# Patient Record
Sex: Female | Born: 1975
Health system: Southern US, Community
[De-identification: ages and names within clinical notes are randomized; demographics above are authoritative.]

## PROBLEM LIST (undated history)

## (undated) DIAGNOSIS — J45909 Unspecified asthma, uncomplicated: Secondary | ICD-10-CM

## (undated) DIAGNOSIS — F319 Bipolar disorder, unspecified: Secondary | ICD-10-CM

## (undated) DIAGNOSIS — F1990 Other psychoactive substance use, unspecified, uncomplicated: Secondary | ICD-10-CM

## (undated) DIAGNOSIS — F419 Anxiety disorder, unspecified: Secondary | ICD-10-CM

## (undated) DIAGNOSIS — F32A Depression, unspecified: Secondary | ICD-10-CM

## (undated) DIAGNOSIS — J449 Chronic obstructive pulmonary disease, unspecified: Secondary | ICD-10-CM

## (undated) DIAGNOSIS — R0602 Shortness of breath: Secondary | ICD-10-CM

## (undated) DIAGNOSIS — L0291 Cutaneous abscess, unspecified: Secondary | ICD-10-CM

## (undated) DIAGNOSIS — F1120 Opioid dependence, uncomplicated: Secondary | ICD-10-CM

## (undated) DIAGNOSIS — J189 Pneumonia, unspecified organism: Secondary | ICD-10-CM

## (undated) DIAGNOSIS — F329 Major depressive disorder, single episode, unspecified: Secondary | ICD-10-CM

## (undated) DIAGNOSIS — F17200 Nicotine dependence, unspecified, uncomplicated: Secondary | ICD-10-CM

## (undated) DIAGNOSIS — A4902 Methicillin resistant Staphylococcus aureus infection, unspecified site: Secondary | ICD-10-CM

## (undated) HISTORY — PX: LAPAROSCOPIC CHOLECYSTECTOMY: SUR755

## (undated) HISTORY — PX: TUBAL LIGATION: SHX77

---

## 2010-02-09 ENCOUNTER — Emergency Department (HOSPITAL_COMMUNITY): Admission: EM | Admit: 2010-02-09 | Discharge: 2010-02-09 | Payer: Self-pay | Admitting: Emergency Medicine

## 2010-03-22 ENCOUNTER — Emergency Department (HOSPITAL_COMMUNITY): Admission: EM | Admit: 2010-03-22 | Discharge: 2010-03-22 | Payer: Self-pay | Admitting: Emergency Medicine

## 2010-05-30 ENCOUNTER — Ambulatory Visit: Payer: Self-pay | Admitting: Gastroenterology

## 2010-09-05 ENCOUNTER — Emergency Department (HOSPITAL_COMMUNITY)
Admission: EM | Admit: 2010-09-05 | Discharge: 2010-09-05 | Payer: Self-pay | Source: Home / Self Care | Admitting: Emergency Medicine

## 2010-12-01 ENCOUNTER — Emergency Department (HOSPITAL_COMMUNITY)
Admission: EM | Admit: 2010-12-01 | Discharge: 2010-12-01 | Payer: Self-pay | Source: Home / Self Care | Admitting: Emergency Medicine

## 2010-12-01 LAB — RAPID STREP SCREEN (MED CTR MEBANE ONLY): Streptococcus, Group A Screen (Direct): POSITIVE — AB

## 2011-01-20 LAB — URINALYSIS, ROUTINE W REFLEX MICROSCOPIC
Bilirubin Urine: NEGATIVE
Hgb urine dipstick: NEGATIVE
Ketones, ur: NEGATIVE mg/dL
Nitrite: NEGATIVE
pH: 7 (ref 5.0–8.0)

## 2012-06-06 ENCOUNTER — Encounter (HOSPITAL_COMMUNITY): Payer: Self-pay | Admitting: *Deleted

## 2012-06-06 ENCOUNTER — Emergency Department (HOSPITAL_COMMUNITY): Payer: Self-pay

## 2012-06-06 ENCOUNTER — Emergency Department (HOSPITAL_COMMUNITY)
Admission: EM | Admit: 2012-06-06 | Discharge: 2012-06-06 | Disposition: A | Discharge status: Home / Self Care | Payer: Self-pay | Attending: Emergency Medicine | Admitting: Emergency Medicine

## 2012-06-06 DIAGNOSIS — R0602 Shortness of breath: Secondary | ICD-10-CM | POA: Insufficient documentation

## 2012-06-06 DIAGNOSIS — R062 Wheezing: Secondary | ICD-10-CM | POA: Insufficient documentation

## 2012-06-06 DIAGNOSIS — J40 Bronchitis, not specified as acute or chronic: Secondary | ICD-10-CM

## 2012-06-06 DIAGNOSIS — F329 Major depressive disorder, single episode, unspecified: Secondary | ICD-10-CM | POA: Insufficient documentation

## 2012-06-06 DIAGNOSIS — F3289 Other specified depressive episodes: Secondary | ICD-10-CM | POA: Insufficient documentation

## 2012-06-06 DIAGNOSIS — F17200 Nicotine dependence, unspecified, uncomplicated: Secondary | ICD-10-CM | POA: Insufficient documentation

## 2012-06-06 HISTORY — DX: Major depressive disorder, single episode, unspecified: F32.9

## 2012-06-06 HISTORY — DX: Depression, unspecified: F32.A

## 2012-06-06 MED ORDER — IPRATROPIUM BROMIDE 0.02 % IN SOLN
0.5000 mg | Freq: Once | RESPIRATORY_TRACT | Status: AC
  Administered 2012-06-06: 0.5 mg via RESPIRATORY_TRACT
  Filled 2012-06-06: qty 2.5

## 2012-06-06 MED ORDER — ALBUTEROL SULFATE (5 MG/ML) 0.5% IN NEBU
5.0000 mg | INHALATION_SOLUTION | Freq: Once | RESPIRATORY_TRACT | Status: AC
  Administered 2012-06-06: 5 mg via RESPIRATORY_TRACT
  Filled 2012-06-06: qty 1

## 2012-06-06 MED ORDER — PREDNISONE 10 MG PO TABS: 20.0000 mg | ORAL_TABLET | Freq: Every day | ORAL | Status: DC

## 2012-06-06 MED ORDER — ALBUTEROL SULFATE HFA 108 (90 BASE) MCG/ACT IN AERS
2.0000 | INHALATION_SPRAY | Freq: Once | RESPIRATORY_TRACT | Status: AC
  Administered 2012-06-06: 2 via RESPIRATORY_TRACT
  Filled 2012-06-06: qty 6.7

## 2012-06-06 MED ORDER — BENZONATATE 100 MG PO CAPS: 100.0000 mg | ORAL_CAPSULE | Freq: Three times a day (TID) | ORAL | Status: AC

## 2012-06-06 MED ORDER — BENZONATATE 100 MG PO CAPS
200.0000 mg | ORAL_CAPSULE | Freq: Once | ORAL | Status: AC
  Administered 2012-06-06: 200 mg via ORAL
  Filled 2012-06-06: qty 2

## 2012-06-06 MED ORDER — PREDNISONE 20 MG PO TABS
60.0000 mg | ORAL_TABLET | Freq: Once | ORAL | Status: AC
  Administered 2012-06-06: 60 mg via ORAL
  Filled 2012-06-06: qty 3

## 2012-06-06 NOTE — ED Notes (Signed)
Reports mild sob that started yesterday, occurs with mild exertion. No resp distress noted at triage, speaking in full sentences. Reports having a "knot" to upper thigh area.

## 2012-06-06 NOTE — ED Notes (Signed)
The pt has had difficulty breathing all day.  She does not have asthma but she is a smoker and she currently has  Audible wheezes.  Coughing non-productive

## 2012-06-06 NOTE — ED Notes (Signed)
The pt has been given prednisone

## 2012-06-06 NOTE — ED Notes (Signed)
The hhn appears to have helped her breathing.  It is almost complete

## 2012-06-06 NOTE — ED Provider Notes (Signed)
History     CSN: [id number]  Arrival date & time 06/06/12  1756   First MD Initiated Contact with Wendy Douglas 06/06/12 2005      Chief Complaint  Wendy Douglas presents with  . Shortness of Breath    (Consider location/radiation/quality/duration/timing/severity/associated sxs/prior treatment) Wendy Douglas is a 36 y.o. female presenting with shortness of breath. The history is provided by the Wendy Douglas.  Shortness of Breath  Associated symptoms include shortness of breath.   Wendy Douglas here with shortness of breath x1 day. She describes wheezing and is worse with exertion. Denies any productive cough or fever. No medications used for this prior to arrival. Denies any anginal type chest pain. Wendy Douglas also notes a knot at her left upper mid thigh which is half or 2 months has been coming and going. Denies any leg pain or swelling with this.  Past Medical History  Diagnosis Date  . Depression     History reviewed. No pertinent past surgical history.  History reviewed. No pertinent family history.  History  Substance Use Topics  . Smoking status: Current Everyday Smoker    Types: Cigarettes  . Smokeless tobacco: Not on file  . Alcohol Use: No    OB History    Grav Para Term Preterm Abortions TAB SAB Ect Mult Living                  Review of Systems  Respiratory: Positive for shortness of breath.   All other systems reviewed and are negative.    Allergies  Bee venom and Sulfa antibiotics  Home Medications  No current outpatient prescriptions on file.  BP 155/93  Pulse 69  Temp 98.5 F (36.9 C) (Oral)  Resp 18  SpO2 97%  LMP 05/23/2012  Physical Exam  Nursing note and vitals reviewed. Constitutional: She is oriented to person, place, and time. She appears well-developed and well-nourished.  Non-toxic appearance. No distress.  HENT:  Head: Normocephalic and atraumatic.  Eyes: Conjunctivae, EOM and lids are normal. Pupils are equal, round, and reactive to light.  Neck: Normal  range of motion. Neck supple. No tracheal deviation present. No mass present.  Cardiovascular: Normal rate, regular rhythm and normal heart sounds.  Exam reveals no gallop.   No murmur heard. Pulmonary/Chest: Effort normal. No stridor. No respiratory distress. She has decreased breath sounds. She has wheezes. She has no rhonchi. She has no rales.  Abdominal: Soft. Normal appearance and bowel sounds are normal. She exhibits no distension. There is no tenderness. There is no rebound and no CVA tenderness.  Musculoskeletal: Normal range of motion. She exhibits no edema and no tenderness.       Small golf ball size round mass at left upper midline without erythema.  Neurological: She is alert and oriented to person, place, and time. She has normal strength. No cranial nerve deficit or sensory deficit. GCS eye subscore is 4. GCS verbal subscore is 5. GCS motor subscore is 6.  Skin: Skin is warm and dry. No abrasion and no rash noted.  Psychiatric: She has a normal mood and affect. Her speech is normal and behavior is normal.    ED Course  Procedures (including critical care time)  Labs Reviewed - No data to display Dg Chest 2 View  06/06/2012  *RADIOLOGY REPORT*  Clinical Data: Shortness of breath, cough, congestion  CHEST - 2 VIEW  Comparison: 09/05/2010  Findings: Hyperinflation noted with diffuse increased interstitial markings, compatible with asthma versus chronic bronchitis.  No focal pneumonia, collapse, consolidation,  definite edema, effusion or pneumothorax.  Trachea midline.  Normal heart size and vascularity.  Previous cholecystectomy evident.  IMPRESSION: Hyperinflation with chronic interstitial changes as described.  Original Report Authenticated By: Judie Petit. Ruel Favors, M.D.     No diagnosis found.    MDM   Date: 06/06/2012  Rate: 76  Rhythm: normal sinus rhythm  QRS Axis: normal  Intervals: normal  ST/T Wave abnormalities: normal  Conduction Disutrbances:none  Narrative  Interpretation:   Old EKG Reviewed: none available   9:22 PM Wendy Douglas given a Bier all treatment and prednisone here. Lung exam repeated and is improved. Chest x-ray without infiltrates. She will be treated for her bronchitis and she was told to stop smoking       Toy Baker, MD 06/06/12 2124

## 2012-11-15 ENCOUNTER — Emergency Department (HOSPITAL_COMMUNITY): Payer: Self-pay

## 2012-11-15 ENCOUNTER — Other Ambulatory Visit: Payer: Self-pay

## 2012-11-15 ENCOUNTER — Encounter (HOSPITAL_COMMUNITY): Payer: Self-pay | Admitting: Radiology

## 2012-11-15 ENCOUNTER — Inpatient Hospital Stay (HOSPITAL_COMMUNITY)
Admission: EM | Admit: 2012-11-15 | Discharge: 2012-11-22 | DRG: 871 | Disposition: A | Discharge status: Home / Self Care | Payer: Self-pay | Attending: Internal Medicine | Admitting: Internal Medicine

## 2012-11-15 DIAGNOSIS — F329 Major depressive disorder, single episode, unspecified: Secondary | ICD-10-CM | POA: Diagnosis present

## 2012-11-15 DIAGNOSIS — D72829 Elevated white blood cell count, unspecified: Secondary | ICD-10-CM | POA: Diagnosis present

## 2012-11-15 DIAGNOSIS — F1920 Other psychoactive substance dependence, uncomplicated: Secondary | ICD-10-CM

## 2012-11-15 DIAGNOSIS — IMO0002 Reserved for concepts with insufficient information to code with codable children: Secondary | ICD-10-CM

## 2012-11-15 DIAGNOSIS — F1120 Opioid dependence, uncomplicated: Secondary | ICD-10-CM | POA: Diagnosis present

## 2012-11-15 DIAGNOSIS — J101 Influenza due to other identified influenza virus with other respiratory manifestations: Secondary | ICD-10-CM | POA: Diagnosis present

## 2012-11-15 DIAGNOSIS — I498 Other specified cardiac arrhythmias: Secondary | ICD-10-CM | POA: Diagnosis present

## 2012-11-15 DIAGNOSIS — E860 Dehydration: Secondary | ICD-10-CM | POA: Diagnosis present

## 2012-11-15 DIAGNOSIS — J9601 Acute respiratory failure with hypoxia: Secondary | ICD-10-CM | POA: Diagnosis present

## 2012-11-15 DIAGNOSIS — Z79899 Other long term (current) drug therapy: Secondary | ICD-10-CM

## 2012-11-15 DIAGNOSIS — F32A Depression, unspecified: Secondary | ICD-10-CM | POA: Diagnosis present

## 2012-11-15 DIAGNOSIS — R6510 Systemic inflammatory response syndrome (SIRS) of non-infectious origin without acute organ dysfunction: Secondary | ICD-10-CM | POA: Diagnosis present

## 2012-11-15 DIAGNOSIS — E871 Hypo-osmolality and hyponatremia: Secondary | ICD-10-CM | POA: Diagnosis present

## 2012-11-15 DIAGNOSIS — F3289 Other specified depressive episodes: Secondary | ICD-10-CM

## 2012-11-15 DIAGNOSIS — F17200 Nicotine dependence, unspecified, uncomplicated: Secondary | ICD-10-CM | POA: Diagnosis present

## 2012-11-15 DIAGNOSIS — J9600 Acute respiratory failure, unspecified whether with hypoxia or hypercapnia: Secondary | ICD-10-CM

## 2012-11-15 DIAGNOSIS — J1000 Influenza due to other identified influenza virus with unspecified type of pneumonia: Secondary | ICD-10-CM | POA: Diagnosis present

## 2012-11-15 DIAGNOSIS — J189 Pneumonia, unspecified organism: Secondary | ICD-10-CM | POA: Diagnosis present

## 2012-11-15 DIAGNOSIS — J441 Chronic obstructive pulmonary disease with (acute) exacerbation: Secondary | ICD-10-CM | POA: Diagnosis present

## 2012-11-15 DIAGNOSIS — A419 Sepsis, unspecified organism: Principal | ICD-10-CM | POA: Diagnosis present

## 2012-11-15 DIAGNOSIS — J449 Chronic obstructive pulmonary disease, unspecified: Secondary | ICD-10-CM | POA: Diagnosis present

## 2012-11-15 HISTORY — DX: Opioid dependence, uncomplicated: F11.20

## 2012-11-15 HISTORY — DX: Nicotine dependence, unspecified, uncomplicated: F17.200

## 2012-11-15 HISTORY — DX: Chronic obstructive pulmonary disease, unspecified: J44.9

## 2012-11-15 LAB — CBC
Hemoglobin: 12.5 g/dL (ref 12.0–15.0)
MCH: 31.8 pg (ref 26.0–34.0)
MCHC: 33.7 g/dL (ref 30.0–36.0)
MCV: 94.4 fL (ref 78.0–100.0)
RBC: 3.93 MIL/uL (ref 3.87–5.11)

## 2012-11-15 LAB — HIV ANTIBODY (ROUTINE TESTING W REFLEX): HIV: NONREACTIVE

## 2012-11-15 LAB — CBC WITH DIFFERENTIAL/PLATELET
Eosinophils Absolute: 0 10*3/uL (ref 0.0–0.7)
Eosinophils Relative: 0 % (ref 0–5)
HCT: 41.6 % (ref 36.0–46.0)
Hemoglobin: 14.3 g/dL (ref 12.0–15.0)
Lymphocytes Relative: 6 % — ABNORMAL LOW (ref 12–46)
Lymphs Abs: 0.8 10*3/uL (ref 0.7–4.0)
MCH: 32.2 pg (ref 26.0–34.0)
MCV: 93.7 fL (ref 78.0–100.0)
Monocytes Absolute: 1.3 10*3/uL — ABNORMAL HIGH (ref 0.1–1.0)
Monocytes Relative: 10 % (ref 3–12)
Platelets: 140 10*3/uL — ABNORMAL LOW (ref 150–400)
RBC: 4.44 MIL/uL (ref 3.87–5.11)
WBC: 13.1 10*3/uL — ABNORMAL HIGH (ref 4.0–10.5)

## 2012-11-15 LAB — POCT I-STAT 3, ART BLOOD GAS (G3+)
Acid-base deficit: 4 mmol/L — ABNORMAL HIGH (ref 0.0–2.0)
Bicarbonate: 21.4 mEq/L (ref 20.0–24.0)
pH, Arterial: 7.35 (ref 7.350–7.450)
pO2, Arterial: 141 mmHg — ABNORMAL HIGH (ref 80.0–100.0)

## 2012-11-15 LAB — BASIC METABOLIC PANEL
BUN: 7 mg/dL (ref 6–23)
CO2: 22 mEq/L (ref 19–32)
Calcium: 9.3 mg/dL (ref 8.4–10.5)
GFR calc non Af Amer: >90 mL/min (ref >90)
Glucose, Bld: 118 mg/dL — ABNORMAL HIGH (ref 70–99)

## 2012-11-15 LAB — CREATININE, SERUM: Creatinine, Ser: 0.58 mg/dL (ref 0.50–1.10)

## 2012-11-15 LAB — INFLUENZA PANEL BY PCR (TYPE A & B)
H1N1 flu by pcr: DETECTED — AB
Influenza B By PCR: NEGATIVE

## 2012-11-15 LAB — TROPONIN I
Troponin I: <0.3 ng/mL (ref <0.30)
Troponin I: <0.3 ng/mL (ref <0.30)

## 2012-11-15 MED ORDER — IPRATROPIUM BROMIDE 0.02 % IN SOLN
0.5000 mg | RESPIRATORY_TRACT | Status: DC
  Administered 2012-11-15: 0.5 mg via RESPIRATORY_TRACT
  Filled 2012-11-15 (×3): qty 2.5

## 2012-11-15 MED ORDER — LACTATED RINGERS IV BOLUS (SEPSIS): 1000.0000 mL | Freq: Once | INTRAVENOUS | Status: DC

## 2012-11-15 MED ORDER — ALBUTEROL SULFATE (5 MG/ML) 0.5% IN NEBU
5.0000 mg | INHALATION_SOLUTION | Freq: Once | RESPIRATORY_TRACT | Status: AC
  Administered 2012-11-15: 5 mg via RESPIRATORY_TRACT

## 2012-11-15 MED ORDER — ALBUTEROL SULFATE (5 MG/ML) 0.5% IN NEBU
2.5000 mg | INHALATION_SOLUTION | RESPIRATORY_TRACT | Status: DC | PRN
  Administered 2012-11-16: 2.5 mg via RESPIRATORY_TRACT
  Filled 2012-11-15: qty 0.5

## 2012-11-15 MED ORDER — CITALOPRAM HYDROBROMIDE 40 MG PO TABS
40.0000 mg | ORAL_TABLET | Freq: Every day | ORAL | Status: DC
  Administered 2012-11-15 – 2012-11-22 (×8): 40 mg via ORAL
  Filled 2012-11-15 (×8): qty 1

## 2012-11-15 MED ORDER — ZOLPIDEM TARTRATE 5 MG PO TABS
5.0000 mg | ORAL_TABLET | Freq: Once | ORAL | Status: AC
  Administered 2012-11-15: 5 mg via ORAL
  Filled 2012-11-15: qty 1

## 2012-11-15 MED ORDER — ACETAMINOPHEN 325 MG PO TABS
650.0000 mg | ORAL_TABLET | Freq: Four times a day (QID) | ORAL | Status: DC | PRN
  Administered 2012-11-16 – 2012-11-17 (×2): 650 mg via ORAL
  Filled 2012-11-15 (×2): qty 2

## 2012-11-15 MED ORDER — ONDANSETRON HCL 4 MG/2ML IJ SOLN: 4.0000 mg | Freq: Four times a day (QID) | INTRAMUSCULAR | Status: DC | PRN

## 2012-11-15 MED ORDER — METHADONE HCL 10 MG/ML PO CONC: 110.0000 mg | Freq: Every day | ORAL | Status: DC

## 2012-11-15 MED ORDER — SODIUM CHLORIDE 0.9 % IV SOLN
1000.0000 mL | INTRAVENOUS | Status: DC
  Administered 2012-11-15: 1000 mL via INTRAVENOUS

## 2012-11-15 MED ORDER — METHACHOLINE CHLORIDE 100 MG IN SOLR: 118.0000 mg | Freq: Every day | RESPIRATORY_TRACT | Status: DC

## 2012-11-15 MED ORDER — METHADONE HCL 10 MG PO TABS
110.0000 mg | ORAL_TABLET | Freq: Every day | ORAL | Status: DC
  Administered 2012-11-15 – 2012-11-22 (×8): 110 mg via ORAL
  Filled 2012-11-15 (×8): qty 11

## 2012-11-15 MED ORDER — IPRATROPIUM BROMIDE 0.02 % IN SOLN
0.5000 mg | Freq: Four times a day (QID) | RESPIRATORY_TRACT | Status: DC
  Administered 2012-11-15 – 2012-11-18 (×15): 0.5 mg via RESPIRATORY_TRACT
  Filled 2012-11-15 (×15): qty 2.5

## 2012-11-15 MED ORDER — SODIUM CHLORIDE 0.9 % IV SOLN: INTRAVENOUS | Status: AC

## 2012-11-15 MED ORDER — METHYLPREDNISOLONE SODIUM SUCC 125 MG IJ SOLR
60.0000 mg | Freq: Three times a day (TID) | INTRAMUSCULAR | Status: DC
  Filled 2012-11-15 (×3): qty 0.96

## 2012-11-15 MED ORDER — DEXTROSE 5 % IV SOLN
500.0000 mg | INTRAVENOUS | Status: AC
  Administered 2012-11-16 – 2012-11-22 (×7): 500 mg via INTRAVENOUS
  Filled 2012-11-15 (×8): qty 500

## 2012-11-15 MED ORDER — METHADONE HCL 10 MG PO TABS: 110.0000 mg | ORAL_TABLET | Freq: Every day | ORAL | Status: DC

## 2012-11-15 MED ORDER — SODIUM CHLORIDE 0.9 % IV SOLN
1000.0000 mL | Freq: Once | INTRAVENOUS | Status: AC
  Administered 2012-11-15: 1000 mL via INTRAVENOUS

## 2012-11-15 MED ORDER — MAGNESIUM SULFATE 40 MG/ML IJ SOLN
2.0000 g | Freq: Once | INTRAMUSCULAR | Status: AC
  Administered 2012-11-15: 2 g via INTRAVENOUS
  Filled 2012-11-15: qty 50

## 2012-11-15 MED ORDER — IOHEXOL 350 MG/ML SOLN
100.0000 mL | Freq: Once | INTRAVENOUS | Status: AC | PRN
  Administered 2012-11-15: 100 mL via INTRAVENOUS

## 2012-11-15 MED ORDER — DEXTROSE 5 % IV SOLN
1.0000 g | Freq: Once | INTRAVENOUS | Status: AC
  Administered 2012-11-15: 1 g via INTRAVENOUS
  Filled 2012-11-15: qty 10

## 2012-11-15 MED ORDER — ALBUTEROL SULFATE (5 MG/ML) 0.5% IN NEBU
5.0000 mg | INHALATION_SOLUTION | RESPIRATORY_TRACT | Status: DC
  Administered 2012-11-15: 5 mg via RESPIRATORY_TRACT
  Filled 2012-11-15: qty 0.5
  Filled 2012-11-15: qty 1

## 2012-11-15 MED ORDER — DEXTROSE 5 % IV SOLN
500.0000 mg | Freq: Once | INTRAVENOUS | Status: AC
  Administered 2012-11-15: 500 mg via INTRAVENOUS
  Filled 2012-11-15: qty 500

## 2012-11-15 MED ORDER — OSELTAMIVIR PHOSPHATE 75 MG PO CAPS
75.0000 mg | ORAL_CAPSULE | Freq: Two times a day (BID) | ORAL | Status: AC
  Administered 2012-11-15 – 2012-11-19 (×10): 75 mg via ORAL
  Filled 2012-11-15 (×10): qty 1

## 2012-11-15 MED ORDER — ENOXAPARIN SODIUM 40 MG/0.4ML ~~LOC~~ SOLN
40.0000 mg | SUBCUTANEOUS | Status: DC
  Administered 2012-11-15 – 2012-11-21 (×7): 40 mg via SUBCUTANEOUS
  Filled 2012-11-15 (×8): qty 0.4

## 2012-11-15 MED ORDER — ALBUTEROL SULFATE (5 MG/ML) 0.5% IN NEBU
2.5000 mg | INHALATION_SOLUTION | Freq: Four times a day (QID) | RESPIRATORY_TRACT | Status: DC
  Administered 2012-11-15 – 2012-11-18 (×15): 2.5 mg via RESPIRATORY_TRACT
  Filled 2012-11-15 (×16): qty 0.5

## 2012-11-15 MED ORDER — METHYLPREDNISOLONE SODIUM SUCC 125 MG IJ SOLR
125.0000 mg | Freq: Once | INTRAMUSCULAR | Status: AC
  Administered 2012-11-15: 125 mg via INTRAVENOUS
  Filled 2012-11-15: qty 2

## 2012-11-15 MED ORDER — DEXTROSE 5 % IV SOLN
1.0000 g | INTRAVENOUS | Status: AC
  Administered 2012-11-15 – 2012-11-21 (×7): 1 g via INTRAVENOUS
  Filled 2012-11-15 (×7): qty 10

## 2012-11-15 NOTE — ED Notes (Signed)
Floor RN notified that pt goes to the methadone clinic daily.

## 2012-11-15 NOTE — H&P (Signed)
[facility]                                                                                    Patient Demographics  Wendy Douglas, is a 37 y.o. female  CSN: [id number]  MRN: [id number]  DOB - 1976/09/27  Admit Date - 11/15/2012  Outpatient Primary MD for the patient is Default, Provider, MD   With History of -  Past Medical History  Diagnosis Date  . Depression   . Smoker   . Chronic narcotic dependence       History reviewed. No pertinent past surgical history.  in for   Chief Complaint  Patient presents with  . Chest Pain  . Cough  . Shortness of Breath     HPI  Wendy Douglas  is a 37 y.o. female, who is pack-a-day smoker, history of narcotic abuse in the past, depression, who did not take a flu shot this year and had been having runny nose for the last 3-4 days and since yesterday developed a productive cough along with high-grade fevers and shortness of breath, presented to the ER with similar symptoms -- ER she was found to be in acute respiratory failure with extreme tachycardia and respiratory distress, her workup was consistent with cavity acquired pneumonia along with generalized wheezing suggesting COPD exacerbation, pulmonary critical care was consulted who requested stepped-down admission by internal medicine.   Patient on BiPAP appears to be much improved, she denies any chest pain or palpitations, denies any exposure to sick contacts, no abdominal pain diarrhea blood in stool or urine, besides as stated above all other review of systems are negative.    Review of Systems    In addition to the HPI above,  Positive Fever-chills, No Headache, No changes with Vision or hearing, No problems swallowing food or Liquids, No Chest pain, positive productive Cough and Shortness of Breath, No Abdominal pain, No Nausea or Vommitting, Bowel movements are regular, No Blood in stool or Urine, No dysuria, No new skin rashes or bruises, No new  joints pains-aches,  No new weakness, tingling, numbness in any extremity, No recent weight gain or loss, No polyuria, polydypsia or polyphagia, No significant Mental Stressors.  A full 10 point Review of Systems was done, except as stated above, all other Review of Systems were negative.   Social History History  Substance Use Topics  . Smoking status: Current Every Day Smoker    Types: Cigarettes  . Smokeless tobacco: Not on file  . Alcohol Use: No     Family History No history of COPD or lung cancers in the family  Prior to Admission medications   Medication Sig Start Date End Date Taking? Authorizing Provider  citalopram (CELEXA) 40 MG tablet Take 40 mg by mouth daily.   Yes Historical Provider, MD  methacholine (PROVOCHOLINE) 100 MG inhaler solution Inhale 118 mg into the lungs daily.   Yes Historical Provider, MD  PRESCRIPTION MEDICATION Take 4 mg by mouth daily. Medication replaced Abilify   Yes Historical Provider, MD    Allergies  Allergen Reactions  . Bee Venom Shortness Of Breath and Swelling  . Sulfa Antibiotics Rash  Physical Exam  Vitals  Blood pressure 107/61, pulse 111, temperature 102.4 F (39.1 C), temperature source Oral, resp. rate 20, last menstrual period 11/08/2012, SpO2 96.00%.   1. General Young Caucasian female sitting in hospital bed wearing BiPAP in mild shortness of breath  2. Normal affect and insight, Not Suicidal or Homicidal, Awake Alert, Oriented X 3.  3. No F.N deficits, ALL C.Nerves Intact, Strength 5/5 all 4 extremities, Sensation intact all 4 extremities, Plantars down going.  4. Ears and Eyes appear Normal, Conjunctivae clear, PERRLA. Moist Oral Mucosa.  5. Supple Neck, No JVD, No cervical lymphadenopathy appriciated, No Carotid Bruits.  6. Symmetrical Chest wall movement, Good air movement bilaterally, bilateral wheezing  7. RRR, No Gallops, Rubs or Murmurs, No Parasternal Heave.  8. Positive Bowel Sounds, Abdomen  Soft, Non tender, No organomegaly appriciated,No rebound -guarding or rigidity.  9.  No Cyanosis, Normal Skin Turgor, No Skin Rash or Bruise.  10. Good muscle tone,  joints appear normal , no effusions, Normal ROM.  11. No Palpable Lymph Nodes in Neck or Axillae     Data Review  CBC  Lab 11/15/12 0124  WBC 13.1*  HGB 14.3  HCT 41.6  PLT 140*  MCV 93.7  MCH 32.2  MCHC 34.4  RDW 12.9  LYMPHSABS 0.8  MONOABS 1.3*  EOSABS 0.0  BASOSABS 0.0  BANDABS --   ------------------------------------------------------------------------------------------------------------------  Chemistries   Lab 11/15/12 0124  NA 131*  K 3.6  CL 94*  CO2 22  GLUCOSE 118*  BUN 7  CREATININE 0.81  CALCIUM 9.3  MG --  AST --  ALT --  ALKPHOS --  BILITOT --   ------------------------------------------------------------------------------------------------------------------ CrCl is unknown because there is no height on file for the current visit. ------------------------------------------------------------------------------------------------------------------ No results found for this basename: TSH,T4TOTAL,FREET3,T3FREE,THYROIDAB in the last 72 hours   Coagulation profile No results found for this basename: INR:5,PROTIME:5 in the last 168 hours ------------------------------------------------------------------------------------------------------------------- No results found for this basename: DDIMER:2 in the last 72 hours -------------------------------------------------------------------------------------------------------------------  Cardiac Enzymes  Lab 11/15/12 0134  CKMB --  TROPONINI <0.30  MYOGLOBIN --   ------------------------------------------------------------------------------------------------------------------ No components found with this basename:  POCBNP:3   ---------------------------------------------------------------------------------------------------------------  Urinalysis    Component Value Date/Time   COLORURINE YELLOW 03/22/2010 1131   APPEARANCEUR CLEAR 03/22/2010 1131   LABSPEC 1.004* 03/22/2010 1131   PHURINE 7.0 03/22/2010 1131   GLUCOSEU NEGATIVE 03/22/2010 1131   HGBUR NEGATIVE 03/22/2010 1131   BILIRUBINUR NEGATIVE 03/22/2010 1131   KETONESUR NEGATIVE 03/22/2010 1131   PROTEINUR NEGATIVE 03/22/2010 1131   UROBILINOGEN 0.2 03/22/2010 1131   NITRITE NEGATIVE 03/22/2010 1131   LEUKOCYTESUR NEGATIVE MICROSCOPIC NOT DONE ON URINES WITH NEGATIVE PROTEIN, BLOOD, LEUKOCYTES, NITRITE, OR GLUCOSE <1000 mg/dL. 03/22/2010 1131    ----------------------------------------------------------------------------------------------------------------  Imaging results:   Ct Angio Chest Pe W/cm &/or Wo Cm  11/15/2012  *RADIOLOGY REPORT*  Clinical Data: Chest pain, shortness of breath, and cough for 2 days.  The  CT ANGIOGRAPHY CHEST  Technique:  Multidetector CT imaging of the chest using the standard protocol during bolus administration of intravenous contrast. Multiplanar reconstructed images including MIPs were obtained and reviewed to evaluate the vascular anatomy.  Contrast: OMNIPAQUE IOHEXOL 350 MG/ML SOLN  Comparison: None.  Findings: Technically adequate study with good opacification of the central and segmental pulmonary arteries.  No focal filling defects.  No evidence of significant pulmonary embolus.  Normal heart size.  Normal caliber thoracic aorta.  The esophagus is mostly decompressed.  Scattered mediastinal and  axillary lymph nodes are not pathologically enlarged.  Visualized portions of the upper abdominal organs are grossly unremarkable.  There are patchy bilateral perihilar and lower lobe infiltrates with airspace appearance.  There is a focal area of more dense consolidation in the lingular segment of the left lung which  likely accounts for the infiltrations seen on previous chest radiograph. Changes could represent diffuse pneumonia, inflammatory process, or other infiltrative process.  There is diffuse appearing bronchial wall thickening suggesting chronic bronchitis.  Airways appear patent without evidence of mucus plugging.  No pleural effusion. No pneumothorax.  Hematocele in the right middle lung.  Normal alignment of thoracic vertebrae.  IMPRESSION: No evidence of significant pulmonary embolus.  Patchy bilateral perihilar and lower lung airspace infiltration.   Original Report Authenticated By: Burman Nieves, M.D.    Dg Chest Port 1 View  11/15/2012  *RADIOLOGY REPORT*  Clinical Data: Chest pain, shortness breath  PORTABLE CHEST - 1 VIEW  Comparison: 06/06/2012  Findings: Patchy infiltrate or atelectasis in the left lower lung.  No effusion.  Heart size normal.  Regional bones unremarkable.  IMPRESSION:  Left lower lung patchy airspace infiltrate suggesting pneumonia.   Original Report Authenticated By: D. Andria Rhein, MD     My personal review of EKG: Rhythm sinus tachycardia rate 138 beats per minute, rate related ST depression diffuse   Assessment & Plan   1. Acute respiratory failure due to combination of community-acquired pneumonia and COPD exacerbation - patient will be continued on BiPAP, her breathing is much improved, IV steroids, empiric IV antibiotics, panculture, nebulizer treatments, counseled to quit smoking, will check ABG however she has been on BiPAP for about a quart now, since there was history of runny nose and she has not taken flu shot will check influenza PCR and for now place her on Tamiflu twice a day. Suspicion for flu is low. Her home dose inhaled methacholine will be continued.   2. History of smoking counseled to quit smoking.   3. History of depression continue home medication no acute issues.   4. Sinus tachycardia upon admission due to #1 above, currently heart rate is  much improved she is chest discomfort free, will cycle troponins and repeat EKG in a few hours.   Patient has history of chronic narcotic dependence will have pharmacy they can sign her home medications again she reports that she had been taking methadone in the past.   DVT Prophylaxis    Lovenox    AM Labs Ordered, also please review Full Orders  Family Communication: Admission, patients condition and plan of care including tests being ordered have been discussed with the patient  who indicates understanding and agree with the plan and Code Status.  Code Status full  Disposition Plan: admit  Time spent in minutes : 35  Condition GUARDED   Leroy Sea M.D on 11/15/2012 at 5:23 AM  Between 7am to 7pm - Pager - [phone]  After 7pm go to [email or web address] - password [email or web address]  And look for the night coverage person covering me after hours  [facility] Office  [phone]

## 2012-11-15 NOTE — ED Notes (Signed)
Pt presents to ED with difficulty breathing, oxygen saturation 92% on NRB upon interview.  Pt had had a productive cough for the past 2 days she describes as green in color.  Also complaining of chest pain, placed on cardiac monitor, 2 IV's in place, breathing treatments started.

## 2012-11-15 NOTE — ED Notes (Addendum)
Pt arrived from with c/o non radiating CP, SOB, cough x 2 days. Pt speaking 2 -3 word sentences. Having difficulty maintaining o2 saturation above. NRB brought o2 saturation up to 93. Denies N/V/D, diphoresis. Activity at onset, pt was sitting.  Rt hand noted to be dusky and dark

## 2012-11-15 NOTE — Progress Notes (Signed)
[facility] Progress Note [place] TEAM 1 - Stepdown/ICU TEAM   Azelia Reiger OZH:[id number] DOB: [birth year] DOA: 11/15/2012 PCP: Default, Provider, MD  Brief narrative: 37 year old female patient who was a smoker but does not carry formal diagnosis of COPD. She did not take the flu shot. She endorsed runny nose for 3-4 days and subsequently has developed a productive cough and shortness of breath. Apparently was having subjective fevers at home. In the ER she was found to be in acute respiratory failure with significant tachycardia and respiratory distress. Diagnostic workup included x-ray as well as a CT of the chest x-ray suggesting that she has bilateral community-acquired pneumonia. Just had generalized wheezing on exam which was suggestive of a COPD exacerbation. Pulmonary critical care medicine was initially consulted but felt the patient did not require ICU care at that time and recommended internal medicine to admit the patient. She was subsequently admitted to the step down unit for further monitoring and treatment to include BiPAP.  Assessment/Plan:  *Acute respiratory failure with hypoxia due to: A) Influenza A (H1N1) B)  CAP (community acquired pneumonia) C)  ?? COPD/ Smoker *Suspect influenza primary etiology to patient's respiratory failure-continue when necessary BiPAP hoping to avoid intubation-currently somewhat stable 100% nonrebreather but still somewhat tacypneic and requiring upright position in bed to maximize oxygenation and symptoms-begin Tamiflu *Likely has a secondary bronchitis and/or pneumonia so will continue antibiotics-Will followup on blood cultures since young patient's with severe influenza are high risk for secondary infection such as the pneumonia and bacteremia *Patient was wheezing at presentation but bronchospasm could have been related to pneumonia and influenza process only-x-ray looks consistent with COPD which currently has not been  diagnosed-we'll discontinue steroids for for now - monitor for recurrent wheezing-continue nebulizers  Chronic narcotic dependence *Has been clean for more than 3 years-confirmed with [facility] patient's methadone dose which is 118 mg daily  Dehydration/ Acute hyponatremia *Increase IV fluids to 100/hour  Depression *Continue home meds  DVT prophylaxis: Lovenox Code Status: Full Family Communication: Spoke with patient Disposition Plan:  SDU  Consultants: None  Procedures: None  Antibiotics: Zithromax 1/13 >>> Rocephin 1/13 >>>  HPI/Subjective: Patient alert but endorses not resting as easily as she was when utilizing the BiPAP. Sitting upright and somewhat forward in the bed to improve respiratory status. Denies abdominal pain, nausea or vomiting at this time.   Objective: Blood pressure 107/63, pulse 95, temperature 98.4 F (36.9 C), temperature source Oral, resp. rate 31, last menstrual period 11/08/2012, SpO2 99.00%.  Intake/Output Summary (Last 24 hours) at 11/15/12 1253 Last data filed at 11/15/12 0800  Gross per 24 hour  Intake     75 ml  Output      0 ml  Net     75 ml     Exam: Followup exam completed  Data Reviewed: Basic Metabolic Panel:  Lab 11/15/12 6295 11/15/12 0124  NA -- 131*  K -- 3.6  CL -- 94*  CO2 -- 22  GLUCOSE -- 118*  BUN -- 7  CREATININE 0.58 0.81  CALCIUM -- 9.3  MG -- --  PHOS -- --   CBC:  Lab 11/15/12 0918 11/15/12 0124  WBC 11.8* 13.1*  NEUTROABS -- 11.0*  HGB 12.5 14.3  HCT 37.1 41.6  MCV 94.4 93.7  PLT 130* 140*   Cardiac Enzymes:  Lab 11/15/12 0918 11/15/12 0530 11/15/12 0134  CKTOTAL -- -- --  CKMB -- -- --  CKMBINDEX -- -- --  TROPONINI <0.30 <0.30 <0.30  Studies:  Recent x-ray studies have been reviewed in detail by the Attending Physician  Scheduled Meds:  Reviewed in detail by the Attending Physician   Junious Silk, ANP [facility] Office  [phone] Pager  [phone]  On-Call/Text Page:      [email or web address]      password [email or web address]  If 7PM-7AM, please contact night-coverage [email or web address] Password [email or web address] 11/15/2012, 12:53 PM   LOS: 0 days   I have personally examined this patient and reviewed the entire database. I have reviewed the above note, made any necessary editorial changes, and agree with its content.  Lonia Blood, MD [facility]

## 2012-11-15 NOTE — Progress Notes (Signed)
Utilization review completed.  

## 2012-11-15 NOTE — Progress Notes (Signed)
Received pt from ED at 6:50.. Pt placed in bed oriented to unit, chg wash done, monitor on.  Report given to on coming nurse.

## 2012-11-15 NOTE — ED Notes (Signed)
Notified lab for Flu swab.

## 2012-11-15 NOTE — ED Provider Notes (Signed)
History     CSN: [id number]  Arrival date & time 11/15/12  0109   First MD Initiated Contact with Patient 11/15/12 0124      Chief Complaint  Patient presents with  . Chest Pain  . Cough  . Shortness of Breath   History obtained per pt and SO, pt having difficulty speaking with respiratory distress.  (Consider location/radiation/quality/duration/timing/severity/associated sxs/prior treatment) HPI Emerson Barretto is a 37 y.o. female with a h/o smoking 1PPD for 20 years presents with 3 days h/o rhinorrhea and subsequently developed cough, productive of yellow to green sputum, fevers and body aches.  Pt started having SOB over last 1-2 days.  Denies chest pain, though her throat hurts on coughing.  Denies abdominal pain, N/V/D, no history of thromboembolic disease.  Past Medical History  Diagnosis Date  . Depression     No past surgical history on file.  No family history on file.  History  Substance Use Topics  . Smoking status: Current Every Day Smoker    Types: Cigarettes  . Smokeless tobacco: Not on file  . Alcohol Use: No    OB History    Grav Para Term Preterm Abortions TAB SAB Ect Mult Living                  Review of Systems At least 10pt or greater review of systems completed and are negative except where specified in the HPI.  Allergies  Bee venom and Sulfa antibiotics  Home Medications   Current Outpatient Rx  Name  Route  Sig  Dispense  Refill  . ARIPIPRAZOLE 5 MG PO TABS   Oral   Take 2.5 mg by mouth daily.         Marland Kitchen CITALOPRAM HYDROBROMIDE 40 MG PO TABS   Oral   Take 40 mg by mouth daily.         Marland Kitchen METHACHOLINE CHLORIDE 100 MG IN SOLR   Inhalation   Inhale 118 mg into the lungs daily.         Marland Kitchen PREDNISONE 10 MG PO TABS   Oral   Take 2 tablets (20 mg total) by mouth daily.   10 tablet   0     BP 133/67  Pulse 141  Temp 102.4 F (39.1 C) (Oral)  Resp 35  SpO2 91%  Physical Exam  Nursing notes reviewed.  Electronic medical  record reviewed. VITAL SIGNS:   Filed Vitals:   11/15/12 0114 11/15/12 0115 11/15/12 0116  BP:   133/67  Pulse: 141    Temp: 102.4 F (39.1 C)    TempSrc: Oral    Resp: 35    SpO2: 82% 91%    CONSTITUTIONAL: Awake, alert, orientedx4, tripoding, in respiratory distress HENT: Atraumatic, normocephalic, oral mucosa pink and moist, airway patent. Nares patent without drainage. External ears normal. EYES: Conjunctiva clear, EOMI, PERRLA NECK: Trachea midline, non-tender, supple CARDIOVASCULAR: Normal heart rate, Normal rhythm, No murmurs, rubs, gallops PULMONARY/CHEST: Poor air movement throughout, decreased in the bases, wheezing throughout - most evident on expiration. Accessory muscle usage.  ABDOMINAL: Non-distended, soft, non-tender - no rebound or guarding.  BS normal. NEUROLOGIC: Non-focal, moving all four extremities, no gross sensory or motor deficits. EXTREMITIES: No clubbing, cyanosis, or edema SKIN: Warm, Dry, No erythema, No rash  ED Course  CRITICAL CARE Performed by: Jones Skene Authorized by: Jones Skene Total critical care time: 30 minutes Critical care was necessary to treat or prevent imminent or life-threatening deterioration of the following conditions:  respiratory failure. Critical care was time spent personally by me on the following activities: discussions with consultants, evaluation of patient's response to treatment, obtaining history from patient or surrogate, ordering and review of laboratory studies, pulse oximetry, re-evaluation of patient's condition, ordering and review of radiographic studies, ordering and performing treatments and interventions, examination of patient and development of treatment plan with patient or surrogate.   (including critical care time)  Date: 11/15/2012  Rate: 138  Rhythm: Sinus tachycardia  QRS Axis: normal  Intervals: normal  ST/T Wave abnormalities: T-wave inversions 2, 3, aVF, V5 V6.  Conduction Disutrbances:  none  Narrative Interpretation: Probable right atrial enlargement, sinus tachycardia, patient does have some T-wave inversions in the inferior lateral distribution possibly rate related, possible ischemia  Labs Reviewed  CBC WITH DIFFERENTIAL - Abnormal; Notable for the following:    WBC 13.1 (*)     Platelets 140 (*)     Neutrophils Relative 84 (*)     Neutro Abs 11.0 (*)     Lymphocytes Relative 6 (*)     Monocytes Absolute 1.3 (*)     All other components within normal limits  BASIC METABOLIC PANEL - Abnormal; Notable for the following:    Sodium 131 (*)     Chloride 94 (*)     Glucose, Bld 118 (*)     All other components within normal limits  INFLUENZA PANEL BY PCR - Abnormal; Notable for the following:    Influenza A By PCR POSITIVE (*)     H1N1 flu by pcr DETECTED (*)     All other components within normal limits  POCT I-STAT 3, BLOOD GAS (G3+) - Abnormal; Notable for the following:    pO2, Arterial 141.0 (*)     Acid-base deficit 4.0 (*)     All other components within normal limits  CBC - Abnormal; Notable for the following:    WBC 11.8 (*)     Platelets 130 (*)     All other components within normal limits  BASIC METABOLIC PANEL - Abnormal; Notable for the following:    Glucose, Bld 104 (*)     BUN 5 (*)     Creatinine, Ser 0.48 (*)     All other components within normal limits  CBC WITH DIFFERENTIAL - Abnormal; Notable for the following:    WBC 12.6 (*)     RBC 3.79 (*)     HCT 35.1 (*)     Platelets 132 (*)     Neutrophils Relative 87 (*)     Neutro Abs 10.9 (*)     Lymphocytes Relative 7 (*)     All other components within normal limits  CULTURE, BLOOD (ROUTINE X 2)  CULTURE, BLOOD (ROUTINE X 2)  TROPONIN I  LACTIC ACID, PLASMA  TROPONIN I  TROPONIN I  TROPONIN I  HIV ANTIBODY (ROUTINE TESTING)  LEGIONELLA ANTIGEN, URINE  STREP PNEUMONIAE URINARY ANTIGEN  CREATININE, SERUM  CULTURE, EXPECTORATED SPUTUM-ASSESSMENT  GRAM STAIN   Ct Angio Chest Pe  W/cm &/or Wo Cm  11/15/2012  *RADIOLOGY REPORT*  Clinical Data: Chest pain, shortness of breath, and cough for 2 days.  The  CT ANGIOGRAPHY CHEST  Technique:  Multidetector CT imaging of the chest using the standard protocol during bolus administration of intravenous contrast. Multiplanar reconstructed images including MIPs were obtained and reviewed to evaluate the vascular anatomy.  Contrast: OMNIPAQUE IOHEXOL 350 MG/ML SOLN  Comparison: None.  Findings: Technically adequate study with good opacification of  the central and segmental pulmonary arteries.  No focal filling defects.  No evidence of significant pulmonary embolus.  Normal heart size.  Normal caliber thoracic aorta.  The esophagus is mostly decompressed.  Scattered mediastinal and axillary lymph nodes are not pathologically enlarged.  Visualized portions of the upper abdominal organs are grossly unremarkable.  There are patchy bilateral perihilar and lower lobe infiltrates with airspace appearance.  There is a focal area of more dense consolidation in the lingular segment of the left lung which likely accounts for the infiltrations seen on previous chest radiograph. Changes could represent diffuse pneumonia, inflammatory process, or other infiltrative process.  There is diffuse appearing bronchial wall thickening suggesting chronic bronchitis.  Airways appear patent without evidence of mucus plugging.  No pleural effusion. No pneumothorax.  Hematocele in the right middle lung.  Normal alignment of thoracic vertebrae.  IMPRESSION: No evidence of significant pulmonary embolus.  Patchy bilateral perihilar and lower lung airspace infiltration.   Original Report Authenticated By: Burman Nieves, M.D.    Dg Chest Port 1 View  11/16/2012  *RADIOLOGY REPORT*  Clinical Data: Evaluate pulmonary infiltrate  PORTABLE CHEST - 1 VIEW  Comparison: 11/15/2012; 06/06/2012; chest CT - 11/15/2012  Findings: Grossly unchanged cardiac silhouette and mediastinal  contours.  Worsening bilateral mid and lower lung heterogeneous airspace opacities.  No definite pleural effusion or pneumothorax. Unchanged bones.  IMPRESSION: Worsening bilateral mid and lower lung airspace opacities concerning for progression of multifocal infection.   Original Report Authenticated By: Tacey Ruiz, MD    Dg Chest Port 1 View  11/15/2012  *RADIOLOGY REPORT*  Clinical Data: Chest pain, shortness breath  PORTABLE CHEST - 1 VIEW  Comparison: 06/06/2012  Findings: Patchy infiltrate or atelectasis in the left lower lung.  No effusion.  Heart size normal.  Regional bones unremarkable.  IMPRESSION:  Left lower lung patchy airspace infiltrate suggesting pneumonia.   Original Report Authenticated By: D. Andria Rhein, MD      1. Chronic narcotic dependence   2. Smoker   3. Depression   4. Acute respiratory failure   5. CAP (community acquired pneumonia)      MDM  Elea Holtzclaw is a 37 y.o. female w/ h/o methadone usage ffrom chronic opioid dependance presents with respiratoyr faliure - she is a 37 yo smoker w/ severe wheezing and poor airmovement.  Suspect PNA Vs influenza or both.    Pt initially improved from a numbers standpoint with repeat nebs and magnesium with steroids also.  Gave Abx empirically after seeing CXR concerning for LLL infiltrate.  Pt not improving as much as I would think - suspect underlying lung disease +/- influenza, but concern for PE remains.  CT angio obtained after discussing risks and benefits of CT scan with patient.  No PE seen, patchy perihilar and LL airspace infiltration seen.    D/W CCM wo thinks this is likely a viral process - agree with stepdown as appropriate.    Feel the pt is getting tired - added Bipap to relieve WOB. Further nebs given.  D/W TRH for admission for resp failure, pneumonia.  Stable, intubation not indicated at this point.        Jones Skene, MD 11/16/12 1645

## 2012-11-16 ENCOUNTER — Inpatient Hospital Stay (HOSPITAL_COMMUNITY): Payer: Self-pay

## 2012-11-16 DIAGNOSIS — J101 Influenza due to other identified influenza virus with other respiratory manifestations: Secondary | ICD-10-CM

## 2012-11-16 LAB — CBC WITH DIFFERENTIAL/PLATELET
Basophils Relative: 0 % (ref 0–1)
Eosinophils Absolute: 0 10*3/uL (ref 0.0–0.7)
Eosinophils Relative: 0 % (ref 0–5)
HCT: 35.1 % — ABNORMAL LOW (ref 36.0–46.0)
Hemoglobin: 12.3 g/dL (ref 12.0–15.0)
Lymphs Abs: 0.9 10*3/uL (ref 0.7–4.0)
MCH: 32.5 pg (ref 26.0–34.0)
MCHC: 35 g/dL (ref 30.0–36.0)
MCV: 92.6 fL (ref 78.0–100.0)
Monocytes Absolute: 0.8 10*3/uL (ref 0.1–1.0)
Monocytes Relative: 6 % (ref 3–12)
RBC: 3.79 MIL/uL — ABNORMAL LOW (ref 3.87–5.11)

## 2012-11-16 LAB — BASIC METABOLIC PANEL
BUN: 5 mg/dL — ABNORMAL LOW (ref 6–23)
CO2: 26 mEq/L (ref 19–32)
Glucose, Bld: 104 mg/dL — ABNORMAL HIGH (ref 70–99)
Potassium: 4.3 mEq/L (ref 3.5–5.1)
Sodium: 141 mEq/L (ref 135–145)

## 2012-11-16 LAB — LEGIONELLA ANTIGEN, URINE: Legionella Antigen, Urine: NEGATIVE

## 2012-11-16 MED ORDER — HYDROCOD POLST-CHLORPHEN POLST 10-8 MG/5ML PO LQCR
5.0000 mL | Freq: Two times a day (BID) | ORAL | Status: DC | PRN
  Administered 2012-11-16 – 2012-11-17 (×2): 5 mL via ORAL
  Filled 2012-11-16 (×4): qty 5

## 2012-11-16 MED ORDER — NICOTINE 21 MG/24HR TD PT24
21.0000 mg | MEDICATED_PATCH | Freq: Every day | TRANSDERMAL | Status: DC
  Administered 2012-11-16 – 2012-11-22 (×7): 21 mg via TRANSDERMAL
  Filled 2012-11-16 (×7): qty 1

## 2012-11-16 MED ORDER — BENZONATATE 100 MG PO CAPS
100.0000 mg | ORAL_CAPSULE | Freq: Three times a day (TID) | ORAL | Status: DC | PRN
  Administered 2012-11-17 – 2012-11-19 (×5): 100 mg via ORAL
  Filled 2012-11-16 (×6): qty 1

## 2012-11-16 MED ORDER — BUDESONIDE 0.5 MG/2ML IN SUSP
0.5000 mg | Freq: Two times a day (BID) | RESPIRATORY_TRACT | Status: DC
  Administered 2012-11-16 – 2012-11-17 (×2): 0.5 mg via RESPIRATORY_TRACT
  Filled 2012-11-16 (×5): qty 2

## 2012-11-16 NOTE — Progress Notes (Signed)
Respiratory therapy note-scanner unavailable, patients medical record, name and birth date identified.

## 2012-11-16 NOTE — Progress Notes (Signed)
[facility] Progress Note [place] TEAM 1 - Stepdown/ICU TEAM   Wendy Douglas EXB:[id number] DOB: [birth year] DOA: 11/15/2012 PCP: Default, Provider, MD  Brief narrative: 37 year old female patient who was a smoker but does not carry formal diagnosis of COPD. She did not take the flu shot. She endorsed runny nose for 3-4 days and subsequently has developed a productive cough and shortness of breath. Apparently was having subjective fevers at home. In the ER she was found to be in acute respiratory failure with significant tachycardia and respiratory distress. Diagnostic workup included x-ray as well as a CT of the chest x-ray suggesting that she has bilateral community-acquired pneumonia. Just had generalized wheezing on exam which was suggestive of a COPD exacerbation. Pulmonary critical care medicine was initially consulted but felt the patient did not require ICU care at that time and recommended internal medicine to admit the patient. She was subsequently admitted to the step down unit for further monitoring and treatment to include BiPAP.  Assessment/Plan:  *Acute respiratory failure with hypoxia due to: A) Influenza A (H1N1) B)  CAP (community acquired pneumonia) C)  ?? COPD/ Smoker *Suspect influenza primary etiology to patient's respiratory failure-continue when necessary BiPAP hoping to avoid intubation-currently somewhat stable 100% nonrebreather but still somewhat tacypneic and requiring upright position in bed to maximize oxygenation and symptoms-begin Tamiflu *Chest x-ray confirms evolving pneumonia process greater on the right so will continue antibiotics-Will followup on blood cultures since young patient's with severe influenza are high risk for bacteremia *Patient was wheezing at presentation but bronchospasm could have been related to pneumonia and influenza process only-x-ray looks consistent with COPD which currently has not been diagnosed-monitor for recurrent  wheezing-continue nebulizers-add Pulmicort neb *Add nicotine patch *Add Tussionex and Tessalon Perles  Chronic narcotic dependence *Has been clean for more than 3 years-confirmed with [facility] patient's methadone dose which is 118 mg daily  Dehydration/ Acute hyponatremia *Continue IV fluids to 100/hour  Depression *Continue home meds  DVT prophylaxis: Lovenox Code Status: Full Family Communication: Spoke with patient Disposition Plan:  SDU  Consultants: None  Procedures: None  Antibiotics: Zithromax 1/13 >>> Rocephin 1/13 >>>  HPI/Subjective: Patient alert and endorses feels better than she did at time of admission but still becomes short of breath especially off oxygen even at rest. States is very hungry would like diet advanced.   Objective: Blood pressure 115/68, pulse 84, temperature 98.2 F (36.8 C), temperature source Oral, resp. rate 18, last menstrual period 11/08/2012, SpO2 100.00%.  Intake/Output Summary (Last 24 hours) at 11/16/12 1341 Last data filed at 11/16/12 1150  Gross per 24 hour  Intake   1900 ml  Output   2800 ml  Net   -900 ml     Exam: Gen.:  In no acute distress Lungs: Clear to auscultation, decreased in the bases, 100% nonrebreather, occasional paroxysmal coughing episodes Heart: S1-S2 without rubs, murmurs, thrills or gallops; no JVD no peripheral edema, IV fluids and use Abdomen: Nontender nondistended bowel sounds present, tolerating clear liquids Musculoskeletal: Symmetrical without cyanosis or clubbing Neurological: Alert and oriented x3, moves all extremities x4, cranial nerves II through XII are grossly intact  Data Reviewed: Basic Metabolic Panel:  Lab 11/16/12 1027 11/15/12 0918 11/15/12 0124  NA 141 -- 131*  K 4.3 -- 3.6  CL 105 -- 94*  CO2 26 -- 22  GLUCOSE 104* -- 118*  BUN 5* -- 7  CREATININE 0.48* 0.58 0.81  CALCIUM 9.1 -- 9.3  MG -- -- --  PHOS -- -- --  CBC:  Lab 11/16/12 0530 11/15/12 0918 11/15/12 0124  WBC  12.6* 11.8* 13.1*  NEUTROABS 10.9* -- 11.0*  HGB 12.3 12.5 14.3  HCT 35.1* 37.1 41.6  MCV 92.6 94.4 93.7  PLT 132* 130* 140*   Cardiac Enzymes:  Lab 11/15/12 1738 11/15/12 0918 11/15/12 0530 11/15/12 0134  CKTOTAL -- -- -- --  CKMB -- -- -- --  CKMBINDEX -- -- -- --  TROPONINI <0.30 <0.30 <0.30 <0.30    Studies:  Recent x-ray studies have been reviewed in detail by the Attending Physician  Scheduled Meds:  Reviewed in detail by the Attending Physician   Junious Silk, ANP [facility] Office  [phone] Pager [phone]  On-Call/Text Page:      [email or web address]      password [email or web address]  If 7PM-7AM, please contact night-coverage [email or web address] Password [facility] 11/16/2012, 1:41 PM   LOS: 1 day   I have examined the patient, reviewed the chart and modified the above note which I agree with.   Calvert Cantor, MD [phone]

## 2012-11-17 MED ORDER — GUAIFENESIN-DM 100-10 MG/5ML PO SYRP
5.0000 mL | ORAL_SOLUTION | ORAL | Status: DC | PRN
  Administered 2012-11-17: 5 mL via ORAL
  Filled 2012-11-17: qty 5

## 2012-11-17 MED ORDER — HYDROCOD POLST-CHLORPHEN POLST 10-8 MG/5ML PO LQCR
5.0000 mL | Freq: Two times a day (BID) | ORAL | Status: DC | PRN
  Administered 2012-11-17 – 2012-11-20 (×6): 5 mL via ORAL
  Filled 2012-11-17 (×6): qty 5

## 2012-11-17 NOTE — Progress Notes (Signed)
Pt's PIV infiltrated, unable to start a new IV. IV team also unable to start IV. Craige Cotta, NP notified. New orders received to insert PICC line. IV team notified. Will continue to monitor.

## 2012-11-17 NOTE — Progress Notes (Signed)
[facility] Progress Note [place] TEAM 1 - Stepdown/ICU TEAM   Wendy Douglas ZOX:[id number] DOB: 11/05/1975 DOA: 11/15/2012 PCP: Default, Provider, MD  Brief narrative: 37 year old female patient who was a smoker but does not carry formal diagnosis of COPD. She did not take the flu shot. She endorsed runny nose for 3-4 days and subsequently has developed a productive cough and shortness of breath. Apparently was having subjective fevers at home. In the ER she was found to be in acute respiratory failure with significant tachycardia and respiratory distress. Diagnostic workup included x-ray as well as a CT of the chest x-ray suggesting that she has bilateral community-acquired pneumonia. Just had generalized wheezing on exam which was suggestive of a COPD exacerbation. Pulmonary critical care medicine was initially consulted but felt the patient did not require ICU care at that time and recommended internal medicine to admit the patient. She was subsequently admitted to the step down unit for further monitoring and treatment to include BiPAP.  Assessment/Plan:  *Acute respiratory failure with hypoxia due to: A) Influenza A (H1N1) B)  CAP (community acquired pneumonia) C)  ?? COPD/ Smoker *Influenza primary etiology to respiratory failure *Chest x-ray confirms evolving pneumonia process greater on the right so will continue antibiotics *Improving, has not required BiPAP in 48 hours and oxygen has been weaned from 100% nonrebreather to 5 L nasal cannula *Blood culture showed no growth to date *Patient was wheezing at presentation but bronchospasm could have been related to pneumonia and influenza process only-x-ray looks consistent with COPD which currently has not been diagnosed-monitor for recurrent wheezing-continue nebulizers-add Pulmicort neb *Followup chest x-ray 11/18/2012 *Continue nicotine patch *Continue Tessalon Perles  Chronic narcotic dependence *Has been clean for more  than 3 years-confirmed with ADS patient's methadone dose which is 118 mg daily - DO NOT USE NARCOTICS OF ANY KIND (including tussionex or codeine)  Dehydration/ Acute hyponatremia *Resolving-KVO IV fluid  Depression *Continue home meds  DVT prophylaxis: Lovenox Code Status: Full Family Communication: Spoke with patient Disposition Plan:  SDU  Consultants: None  Procedures: None  Antibiotics: Zithromax 1/13 >>> Rocephin 1/13 >>>  HPI/Subjective: Endorses improving but still cannot mobilize without becoming significant shortness of breath associated tachycardia, tachypnea and desaturation. Tolerating advancement to solid diet.   Objective: Blood pressure 130/85, pulse 84, temperature 98.9 F (37.2 C), temperature source Oral, resp. rate 22, last menstrual period 11/08/2012, SpO2 99.00%.  Intake/Output Summary (Last 24 hours) at 11/17/12 1353 Last data filed at 11/17/12 1300  Gross per 24 hour  Intake    600 ml  Output   1400 ml  Net   -800 ml     Exam: Gen.:  In no acute distress at rest  Lungs: faint bibasilar crackles to auscultation, decreased in the bases, 100% nonrebreather, occasional paroxysmal coughing episodes Heart: S1-S2 without rubs, murmurs, thrills or gallops; no JVD no peripheral edema, IV fluids and use Abdomen: Nontender nondistended bowel sounds present, tolerating clear liquids Musculoskeletal: Symmetrical without cyanosis or clubbing Neurological: Alert and oriented x3, moves all extremities x4, cranial nerves II through XII are grossly intact  Data Reviewed: Basic Metabolic Panel:  Lab 11/16/12 8119 11/15/12 0918 11/15/12 0124  NA 141 -- 131*  K 4.3 -- 3.6  CL 105 -- 94*  CO2 26 -- 22  GLUCOSE 104* -- 118*  BUN 5* -- 7  CREATININE 0.48* 0.58 0.81  CALCIUM 9.1 -- 9.3  MG -- -- --  PHOS -- -- --   CBC:  Lab 11/16/12 0530  11/15/12 0918 11/15/12 0124  WBC 12.6* 11.8* 13.1*  NEUTROABS 10.9* -- 11.0*  HGB 12.3 12.5 14.3  HCT 35.1* 37.1  41.6  MCV 92.6 94.4 93.7  PLT 132* 130* 140*   Cardiac Enzymes:  Lab 11/15/12 1738 11/15/12 0918 11/15/12 0530 11/15/12 0134  CKTOTAL -- -- -- --  CKMB -- -- -- --  CKMBINDEX -- -- -- --  TROPONINI <0.30 <0.30 <0.30 <0.30    Studies:  Recent x-ray studies have been reviewed in detail by the Attending Physician  Scheduled Meds:  Reviewed in detail by the Attending Physician   Junious Silk, ANP [facility] Office  [phone] Pager [phone]  On-Call/Text Page:      [email or web address]      password [email or web address]  If 7PM-7AM, please contact night-coverage [email or web address] Password [email or web address] 11/17/2012, 1:53 PM   LOS: 2 days   I have personally examined this patient and reviewed the entire database. I have reviewed the above note, made any necessary editorial changes, and agree with its content.  Lonia Blood, MD [facility]

## 2012-11-18 ENCOUNTER — Inpatient Hospital Stay (HOSPITAL_COMMUNITY): Payer: MEDICAID

## 2012-11-18 LAB — BASIC METABOLIC PANEL
CO2: 25 mEq/L (ref 19–32)
Calcium: 9.5 mg/dL (ref 8.4–10.5)
Creatinine, Ser: 0.49 mg/dL — ABNORMAL LOW (ref 0.50–1.10)
Glucose, Bld: 154 mg/dL — ABNORMAL HIGH (ref 70–99)
Sodium: 136 mEq/L (ref 135–145)

## 2012-11-18 LAB — CBC
HCT: 35.6 % — ABNORMAL LOW (ref 36.0–46.0)
MCH: 32.9 pg (ref 26.0–34.0)
MCV: 94.4 fL (ref 78.0–100.0)
Platelets: 144 10*3/uL — ABNORMAL LOW (ref 150–400)
RBC: 3.77 MIL/uL — ABNORMAL LOW (ref 3.87–5.11)

## 2012-11-18 MED ORDER — IPRATROPIUM BROMIDE 0.02 % IN SOLN
0.5000 mg | Freq: Three times a day (TID) | RESPIRATORY_TRACT | Status: DC
  Administered 2012-11-19: 0.5 mg via RESPIRATORY_TRACT
  Filled 2012-11-18: qty 2.5

## 2012-11-18 MED ORDER — ALBUTEROL SULFATE (5 MG/ML) 0.5% IN NEBU
2.5000 mg | INHALATION_SOLUTION | Freq: Three times a day (TID) | RESPIRATORY_TRACT | Status: DC
  Administered 2012-11-19: 2.5 mg via RESPIRATORY_TRACT
  Filled 2012-11-18: qty 0.5

## 2012-11-18 NOTE — Progress Notes (Signed)
Utilization review completed.  

## 2012-11-18 NOTE — Progress Notes (Signed)
Report called pt transferring to 6N02 via w/c with belongings on 2l/min oxygen via nasal cannula. Friend at bedside, no complaints. Wendy Douglas

## 2012-11-18 NOTE — Progress Notes (Signed)
[facility] Progress Note [place] TEAM 1 - Stepdown/ICU TEAM   Wendy Douglas ZOX:[id number] DOB: 1976/01/05 DOA: 11/15/2012 PCP: Default, Provider, MD  Brief narrative: 37 year old female patient who was a smoker but does not carry formal diagnosis of COPD. She did not take the flu shot. She endorsed runny nose for 3-4 days and subsequently has developed a productive cough and shortness of breath. Apparently was having subjective fevers at home. In the ER she was found to be in acute respiratory failure with significant tachycardia and respiratory distress. Diagnostic workup included x-ray as well as a CT of the chest x-ray suggesting that she has bilateral community-acquired pneumonia. Just had generalized wheezing on exam which was suggestive of a COPD exacerbation. Pulmonary critical care medicine was initially consulted but felt the patient did not require ICU care at that time and recommended internal medicine to admit the patient. She was subsequently admitted to the step down unit for further monitoring and treatment to include BiPAP.  Assessment/Plan:  *Acute respiratory failure with hypoxia due to: A) Influenza A (H1N1) B)  CAP (community acquired pneumonia) C)  ?? COPD/ Smoker *Chest x-ray confirms evolving pneumonia process greater on the right so will continue antibiotics- repeat CXR reveals some improvement *Improving, has not required BiPAP in 48 hours and oxygen has been weaned from 100% nonrebreather to 4 L nasal cannula *Continue nicotine patch *Continue Tessalon Perles  Chronic narcotic dependence *Has been clean for more than 3 years-confirmed with ADS patient's methadone dose which is 118 mg daily - DO NOT USE NARCOTICS OF ANY KIND (including tussionex or codeine)  Dehydration/ Acute hyponatremia *Resolving-KVO IV fluid  Depression *Continue home meds  DVT prophylaxis: Lovenox Code Status: Full Family Communication: Spoke with patient Disposition Plan:   Transfer to floor  Consultants: None  Procedures: None  Antibiotics: Zithromax 1/13 >>> Rocephin 1/13 >>>  HPI/Subjective: Endorses endorses continued weakness and dyspnea on exertion but as compared to prior to admission and has improved. Has not utilized BiPAP and over 48 hours and is tolerating solid diet   Objective: Blood pressure 116/74, pulse 75, temperature 99.2 F (37.3 C), temperature source Oral, resp. rate 20, height 5\' 6"  (1.676 m), weight 70.1 kg (154 lb 8.7 oz), last menstrual period 11/08/2012, SpO2 92.00%.  Intake/Output Summary (Last 24 hours) at 11/18/12 1445 Last data filed at 11/18/12 0909  Gross per 24 hour  Intake   1500 ml  Output   2550 ml  Net  -1050 ml     Exam: Gen.:  In no acute distress at rest  Lungs: faint bibasilar crackles to auscultation, decreased in the bases, 100% nonrebreather, occasional paroxysmal coughing episodes Heart: S1-S2 without rubs, murmurs, thrills or gallops; no JVD no peripheral edema, IV fluids and use Abdomen: Nontender nondistended bowel sounds present, tolerating clear liquids Musculoskeletal: Symmetrical without cyanosis or clubbing Neurological: Alert and oriented x3, moves all extremities x4, cranial nerves II through XII are grossly intact  Data Reviewed: Basic Metabolic Panel:  Lab 11/18/12 8119 11/16/12 0530 11/15/12 0918 11/15/12 0124  NA 136 141 -- 131*  K 3.6 4.3 -- 3.6  CL 97 105 -- 94*  CO2 25 26 -- 22  GLUCOSE 154* 104* -- 118*  BUN 5* 5* -- 7  CREATININE 0.49* 0.48* 0.58 0.81  CALCIUM 9.5 9.1 -- 9.3  MG -- -- -- --  PHOS -- -- -- --   CBC:  Lab 11/18/12 1020 11/16/12 0530 11/15/12 0918 11/15/12 0124  WBC 5.9 12.6* 11.8* 13.1*  NEUTROABS -- 10.9* -- 11.0*  HGB 12.4 12.3 12.5 14.3  HCT 35.6* 35.1* 37.1 41.6  MCV 94.4 92.6 94.4 93.7  PLT 144* 132* 130* 140*   Cardiac Enzymes:  Lab 11/15/12 1738 11/15/12 0918 11/15/12 0530 11/15/12 0134  CKTOTAL -- -- -- --  CKMB -- -- -- --  CKMBINDEX  -- -- -- --  TROPONINI <0.30 <0.30 <0.30 <0.30    Studies:  Recent x-ray studies have been reviewed in detail by the Attending Physician  Scheduled Meds:  Reviewed in detail by the Attending Physician   Junious Silk, ANP [facility] Office  [phone] Pager [phone]  On-Call/Text Page:      [email or web address]      password [email or web address]  If 7PM-7AM, please contact night-coverage [email or web address] Password [email or web address] 11/18/2012, 2:45 PM   LOS: 3 days    I have examined the patient, reviewed the chart and modified the above note which I agree with.   Calvert Cantor, MD [phone]

## 2012-11-19 DIAGNOSIS — E871 Hypo-osmolality and hyponatremia: Secondary | ICD-10-CM

## 2012-11-19 MED ORDER — MENTHOL 3 MG MT LOZG: 1.0000 | LOZENGE | OROMUCOSAL | Status: DC | PRN

## 2012-11-19 MED ORDER — ALBUTEROL SULFATE (5 MG/ML) 0.5% IN NEBU
2.5000 mg | INHALATION_SOLUTION | Freq: Four times a day (QID) | RESPIRATORY_TRACT | Status: DC
Start: 2012-11-19 — End: 2012-11-20
  Administered 2012-11-19 – 2012-11-20 (×4): 2.5 mg via RESPIRATORY_TRACT
  Filled 2012-11-19 (×5): qty 0.5

## 2012-11-19 MED ORDER — IPRATROPIUM BROMIDE 0.02 % IN SOLN
0.5000 mg | Freq: Four times a day (QID) | RESPIRATORY_TRACT | Status: DC
  Administered 2012-11-19 – 2012-11-20 (×4): 0.5 mg via RESPIRATORY_TRACT
  Filled 2012-11-19 (×5): qty 2.5

## 2012-11-19 MED ORDER — BENZONATATE 100 MG PO CAPS
100.0000 mg | ORAL_CAPSULE | Freq: Three times a day (TID) | ORAL | Status: DC
  Administered 2012-11-19 – 2012-11-22 (×9): 100 mg via ORAL
  Filled 2012-11-19 (×12): qty 1

## 2012-11-19 NOTE — Progress Notes (Signed)
Patient ID: Wendy Douglas  female  ZOX:[id number]    DOB: 29-Oct-1976    DOA: 11/15/2012  PCP: Default, Provider, MD  Assessment/Plan:  Principal Problem: Acute respiratory failure with hypoxia due to:  A) Influenza A (H1N1)  B) CAP (community acquired pneumonia)  C) patient has underlying COPD given her ongoing nicotine abuse COPD/ Smoker  -Chest x-ray 11/18/2012 shows bilateral airspace disease, continue Zithromax and Rocephin  - Continue albuterol and ipratropium nebs qid, flutter valve, incentive spirometry - Counselled strongly against tobacco use, continue nicotine patch  - Continue Tessalon Perles  - Will do outpatient pulmonology referral  Chronic narcotic dependence  *Has been clean for more than 3 years-confirmed with [facility] patient's methadone dose which is 118 mg daily - DO NOT USE NARCOTICS OF ANY KIND   Dehydration/ Acute hyponatremia  -Resolving-KVO IV fluid   Depression  -Continue home meds  DVT Prophylaxis:  Code Status:  Disposition: Hopefully over the weekend if her pulmonary is improving    Subjective: Still coughing and wheezing, no chest pain for nausea and vomiting, abd pain  Objective: Weight change:   Intake/Output Summary (Last 24 hours) at 11/19/12 1202 Last data filed at 11/19/12 0837  Gross per 24 hour  Intake    720 ml  Output      0 ml  Net    720 ml   Blood pressure 111/68, pulse 68, temperature 98.8 F (37.1 C), temperature source Oral, resp. rate 18, height 5\' 6"  (1.676 m), weight 70.1 kg (154 lb 8.7 oz), last menstrual period 11/08/2012, SpO2 94.00%.  Physical Exam: General: Alert and awake, oriented x3, not in any acute distress. HEENT: anicteric sclera, pupils reactive to light and accommodation, EOMI CVS: S1-S2 clear, no murmur rubs or gallops Chest: Bilateral expiratory wheezing  Abdomen: soft nontender, nondistended, normal bowel sounds, no organomegaly Extremities: no cyanosis, clubbing or edema noted bilaterally Neuro:  Cranial nerves II-XII intact, no focal neurological deficits  Lab Results: Basic Metabolic Panel:  Lab 11/18/12 8119 11/16/12 0530  NA 136 141  K 3.6 4.3  CL 97 105  CO2 25 26  GLUCOSE 154* 104*  BUN 5* 5*  CREATININE 0.49* 0.48*  CALCIUM 9.5 9.1  MG -- --  PHOS -- --   Liver Function Tests: No results found for this basename: AST:2,ALT:2,ALKPHOS:2,BILITOT:2,PROT:2,ALBUMIN:2 in the last 168 hours No results found for this basename: LIPASE:2,AMYLASE:2 in the last 168 hours No results found for this basename: AMMONIA:2 in the last 168 hours CBC:  Lab 11/18/12 1020 11/16/12 0530  WBC 5.9 12.6*  NEUTROABS -- 10.9*  HGB 12.4 12.3  HCT 35.6* 35.1*  MCV 94.4 92.6  PLT 144* 132*   Cardiac Enzymes:  Lab 11/15/12 1738 11/15/12 0918 11/15/12 0530  CKTOTAL -- -- --  CKMB -- -- --  CKMBINDEX -- -- --  TROPONINI <0.30 <0.30 <0.30   BNP: No components found with this basename: POCBNP:2 CBG: No results found for this basename: GLUCAP:5 in the last 168 hours   Micro Results: Recent Results (from the past 240 hour(s))  CULTURE, BLOOD (ROUTINE X 2)     Status: Normal (Preliminary result)   Collection Time   11/15/12  1:36 AM      Component Value Range Status Comment   Specimen Description BLOOD LEFT ARM   Final    Special Requests BOTTLES DRAWN AEROBIC AND ANAEROBIC 5CC EA   Final    Culture  Setup Time 11/15/2012 08:43   Final    Culture  Final    Value:        BLOOD CULTURE RECEIVED NO GROWTH TO DATE CULTURE WILL BE HELD FOR 5 DAYS BEFORE ISSUING A FINAL NEGATIVE REPORT   Report Status PENDING   Incomplete   CULTURE, BLOOD (ROUTINE X 2)     Status: Normal (Preliminary result)   Collection Time   11/15/12  1:43 AM      Component Value Range Status Comment   Specimen Description BLOOD RIGHT ARM   Final    Special Requests BOTTLES DRAWN AEROBIC AND ANAEROBIC 5CC EA   Final    Culture  Setup Time 11/15/2012 08:43   Final    Culture     Final    Value:        BLOOD CULTURE  RECEIVED NO GROWTH TO DATE CULTURE WILL BE HELD FOR 5 DAYS BEFORE ISSUING A FINAL NEGATIVE REPORT   Report Status PENDING   Incomplete     Studies/Results: Dg Chest 2 View  11/18/2012  *RADIOLOGY REPORT*  Clinical Data: Follow-up pneumonia.  CHEST - 2 VIEW  Comparison: 11/18/2012  Findings: Patchy bilateral airspace disease again noted, minimally improved.  Heart is normal size.  No visible effusions or acute bony abnormality.  IMPRESSION: Slight improvement in patchy bilateral airspace disease.   Original Report Authenticated By: Charlett Nose, M.D.    Ct Angio Chest Pe W/cm &/or Wo Cm  11/15/2012  *RADIOLOGY REPORT*  Clinical Data: Chest pain, shortness of breath, and cough for 2 days.  The  CT ANGIOGRAPHY CHEST  Technique:  Multidetector CT imaging of the chest using the standard protocol during bolus administration of intravenous contrast. Multiplanar reconstructed images including MIPs were obtained and reviewed to evaluate the vascular anatomy.  Contrast: OMNIPAQUE IOHEXOL 350 MG/ML SOLN  Comparison: None.  Findings: Technically adequate study with good opacification of the central and segmental pulmonary arteries.  No focal filling defects.  No evidence of significant pulmonary embolus.  Normal heart size.  Normal caliber thoracic aorta.  The esophagus is mostly decompressed.  Scattered mediastinal and axillary lymph nodes are not pathologically enlarged.  Visualized portions of the upper abdominal organs are grossly unremarkable.  There are patchy bilateral perihilar and lower lobe infiltrates with airspace appearance.  There is a focal area of more dense consolidation in the lingular segment of the left lung which likely accounts for the infiltrations seen on previous chest radiograph. Changes could represent diffuse pneumonia, inflammatory process, or other infiltrative process.  There is diffuse appearing bronchial wall thickening suggesting chronic bronchitis.  Airways appear patent without  evidence of mucus plugging.  No pleural effusion. No pneumothorax.  Hematocele in the right middle lung.  Normal alignment of thoracic vertebrae.  IMPRESSION: No evidence of significant pulmonary embolus.  Patchy bilateral perihilar and lower lung airspace infiltration.   Original Report Authenticated By: Burman Nieves, M.D.    Dg Chest Port 1 View  11/18/2012  *RADIOLOGY REPORT*  Clinical Data: Follow up infiltrate  PORTABLE CHEST - 1 VIEW  Comparison: Prior chest x-ray 11/16/2012  Findings: Slightly improved patchy opacities in the right greater than left lung bases.  Stable borderline cardiomegaly.  Mild vascular congestion without overt edema.  No acute osseous abnormality.  IMPRESSION:  Improving patchy right worse than left bibasilar air space disease.   Original Report Authenticated By: Malachy Moan, M.D.    Dg Chest Port 1 View  11/16/2012  *RADIOLOGY REPORT*  Clinical Data: Evaluate pulmonary infiltrate  PORTABLE CHEST - 1 VIEW  Comparison: 11/15/2012; 06/06/2012; chest CT - 11/15/2012  Findings: Grossly unchanged cardiac silhouette and mediastinal contours.  Worsening bilateral mid and lower lung heterogeneous airspace opacities.  No definite pleural effusion or pneumothorax. Unchanged bones.  IMPRESSION: Worsening bilateral mid and lower lung airspace opacities concerning for progression of multifocal infection.   Original Report Authenticated By: Tacey Ruiz, MD    Dg Chest Port 1 View  11/15/2012  *RADIOLOGY REPORT*  Clinical Data: Chest pain, shortness breath  PORTABLE CHEST - 1 VIEW  Comparison: 06/06/2012  Findings: Patchy infiltrate or atelectasis in the left lower lung.  No effusion.  Heart size normal.  Regional bones unremarkable.  IMPRESSION:  Left lower lung patchy airspace infiltrate suggesting pneumonia.   Original Report Authenticated By: D. Andria Rhein, MD     Medications: Scheduled Meds:   . albuterol  2.5 mg Nebulization QID  . azithromycin  500 mg Intravenous Q24H    . benzonatate  100 mg Oral TID  . cefTRIAXone (ROCEPHIN)  IV  1 g Intravenous Q24H  . citalopram  40 mg Oral Daily  . enoxaparin (LOVENOX) injection  40 mg Subcutaneous Q24H  . ipratropium  0.5 mg Nebulization QID  . methadone  110 mg Oral Daily  . nicotine  21 mg Transdermal Daily  . oseltamivir  75 mg Oral BID      LOS: 4 days   RAI,RIPUDEEP M.D. [facility] 11/19/2012, 12:02 PM Pager: [phone]  If 7PM-7AM, please contact night-coverage [email or web address] Password [email or web address]

## 2012-11-20 DIAGNOSIS — R6510 Systemic inflammatory response syndrome (SIRS) of non-infectious origin without acute organ dysfunction: Secondary | ICD-10-CM

## 2012-11-20 DIAGNOSIS — D72829 Elevated white blood cell count, unspecified: Secondary | ICD-10-CM

## 2012-11-20 LAB — BASIC METABOLIC PANEL
BUN: 6 mg/dL (ref 6–23)
Calcium: 9.6 mg/dL (ref 8.4–10.5)
Creatinine, Ser: 0.5 mg/dL (ref 0.50–1.10)
GFR calc Af Amer: >90 mL/min (ref >90)
GFR calc non Af Amer: >90 mL/min (ref >90)
Potassium: 3.7 mEq/L (ref 3.5–5.1)

## 2012-11-20 LAB — CBC
HCT: 33.9 % — ABNORMAL LOW (ref 36.0–46.0)
MCH: 31.8 pg (ref 26.0–34.0)
MCHC: 33.6 g/dL (ref 30.0–36.0)
RDW: 13.7 % (ref 11.5–15.5)

## 2012-11-20 MED ORDER — IPRATROPIUM BROMIDE 0.02 % IN SOLN
0.5000 mg | Freq: Four times a day (QID) | RESPIRATORY_TRACT | Status: DC
  Administered 2012-11-21 – 2012-11-22 (×5): 0.5 mg via RESPIRATORY_TRACT
  Filled 2012-11-20 (×5): qty 2.5

## 2012-11-20 MED ORDER — IPRATROPIUM BROMIDE 0.02 % IN SOLN
0.5000 mg | RESPIRATORY_TRACT | Status: DC
  Administered 2012-11-20 (×3): 0.5 mg via RESPIRATORY_TRACT
  Filled 2012-11-20 (×3): qty 2.5

## 2012-11-20 MED ORDER — ALBUTEROL SULFATE (5 MG/ML) 0.5% IN NEBU
2.5000 mg | INHALATION_SOLUTION | RESPIRATORY_TRACT | Status: DC
  Administered 2012-11-20 (×3): 2.5 mg via RESPIRATORY_TRACT
  Filled 2012-11-20 (×3): qty 0.5

## 2012-11-20 MED ORDER — ALBUTEROL SULFATE (5 MG/ML) 0.5% IN NEBU
2.5000 mg | INHALATION_SOLUTION | Freq: Four times a day (QID) | RESPIRATORY_TRACT | Status: DC
  Administered 2012-11-21 – 2012-11-22 (×5): 2.5 mg via RESPIRATORY_TRACT
  Filled 2012-11-20 (×5): qty 0.5

## 2012-11-20 MED ORDER — BUDESONIDE-FORMOTEROL FUMARATE 160-4.5 MCG/ACT IN AERO
2.0000 | INHALATION_SPRAY | Freq: Two times a day (BID) | RESPIRATORY_TRACT | Status: DC
  Administered 2012-11-20 – 2012-11-22 (×5): 2 via RESPIRATORY_TRACT
  Filled 2012-11-20: qty 6

## 2012-11-20 MED ORDER — METHYLPREDNISOLONE SODIUM SUCC 125 MG IJ SOLR
60.0000 mg | Freq: Four times a day (QID) | INTRAMUSCULAR | Status: DC
  Administered 2012-11-20 – 2012-11-21 (×5): 60 mg via INTRAVENOUS
  Filled 2012-11-20 (×8): qty 0.96

## 2012-11-20 MED ORDER — ALBUTEROL SULFATE (5 MG/ML) 0.5% IN NEBU: 2.5000 mg | INHALATION_SOLUTION | RESPIRATORY_TRACT | Status: DC | PRN

## 2012-11-20 NOTE — Progress Notes (Signed)
Patient ID: Wendy Douglas  female  [id number]    DOB: 26-Jan-1976    DOA: 11/15/2012  PCP: Default, Provider, MD  Assessment/Plan:  Principal Problem: Acute respiratory failure with hypoxia due to: still very wheezy A) Influenza A (H1N1)  B) CAP (community acquired pneumonia)  C) patient has underlying COPD given her ongoing nicotine abuse COPD/ Smoker  -Chest x-ray 11/18/2012 shows bilateral airspace disease, continue Zithromax and Rocephin  - Continue albuterol and ipratropium nebs q4hours, flutter valve, incentive spirometry - Counselled strongly against tobacco use, continue nicotine patch  - Continue Tessalon Perles, added symbicort and IV solumedrol  - Will do outpatient pulmonology referral  Chronic narcotic dependence  *Has been clean for more than 3 years-confirmed with [facility] patient's methadone dose which is 118 mg daily - DO NOT USE NARCOTICS OF ANY KIND   Dehydration/ Acute hyponatremia: resolved   Depression  -Continue home meds  DVT Prophylaxis:  Code Status:  Disposition: 1-2 days    Subjective: Still very wheezing, coughing improving, no chest pain for nausea and vomiting, abd pain  Objective: Weight change:   Intake/Output Summary (Last 24 hours) at 11/20/12 1113 Last data filed at 11/20/12 0624  Gross per 24 hour  Intake    340 ml  Output    600 ml  Net   -260 ml   Blood pressure 108/58, pulse 88, temperature 98 F (36.7 C), temperature source Oral, resp. rate 20, height 5\' 6"  (1.676 m), weight 70.1 kg (154 lb 8.7 oz), last menstrual period 11/08/2012, SpO2 95.00%.  Physical Exam: General: Alert and awake, oriented x3, not in any acute distress. CVS: S1-S2 clear, no murmur rubs or gallops Chest: Bilateral expiratory wheezing, no significant improvement  Abdomen: soft, NT, ND, NBS Extremities: no cyanosis, clubbing or edema noted bilaterally   Lab Results: Basic Metabolic Panel:  Lab 11/20/12 9562 11/18/12 1020  NA 136 136  K 3.7 3.6  CL  95* 97  CO2 28 25  GLUCOSE 100* 154*  BUN 6 5*  CREATININE 0.50 0.49*  CALCIUM 9.6 9.5  MG -- --  PHOS -- --   CBC:  Lab 11/20/12 0720 11/18/12 1020 11/16/12 0530  WBC 8.2 5.9 --  NEUTROABS -- -- 10.9*  HGB 11.4* 12.4 --  HCT 33.9* 35.6* --  MCV 94.4 94.4 --  PLT 206 144* --   Cardiac Enzymes:  Lab 11/15/12 1738 11/15/12 0918 11/15/12 0530  CKTOTAL -- -- --  CKMB -- -- --  CKMBINDEX -- -- --  TROPONINI <0.30 <0.30 <0.30   BNP: No components found with this basename: POCBNP:2 CBG: No results found for this basename: GLUCAP:5 in the last 168 hours   Micro Results: Recent Results (from the past 240 hour(s))  CULTURE, BLOOD (ROUTINE X 2)     Status: Normal (Preliminary result)   Collection Time   11/15/12  1:36 AM      Component Value Range Status Comment   Specimen Description BLOOD LEFT ARM   Final    Special Requests BOTTLES DRAWN AEROBIC AND ANAEROBIC 5CC EA   Final    Culture  Setup Time 11/15/2012 08:43   Final    Culture     Final    Value:        BLOOD CULTURE RECEIVED NO GROWTH TO DATE CULTURE WILL BE HELD FOR 5 DAYS BEFORE ISSUING A FINAL NEGATIVE REPORT   Report Status PENDING   Incomplete   CULTURE, BLOOD (ROUTINE X 2)     Status: Normal (  Preliminary result)   Collection Time   11/15/12  1:43 AM      Component Value Range Status Comment   Specimen Description BLOOD RIGHT ARM   Final    Special Requests BOTTLES DRAWN AEROBIC AND ANAEROBIC 5CC EA   Final    Culture  Setup Time 11/15/2012 08:43   Final    Culture     Final    Value:        BLOOD CULTURE RECEIVED NO GROWTH TO DATE CULTURE WILL BE HELD FOR 5 DAYS BEFORE ISSUING A FINAL NEGATIVE REPORT   Report Status PENDING   Incomplete     Studies/Results: Dg Chest 2 View  11/18/2012  *RADIOLOGY REPORT*  Clinical Data: Follow-up pneumonia.  CHEST - 2 VIEW  Comparison: 11/18/2012  Findings: Patchy bilateral airspace disease again noted, minimally improved.  Heart is normal size.  No visible effusions or  acute bony abnormality.  IMPRESSION: Slight improvement in patchy bilateral airspace disease.   Original Report Authenticated By: Charlett Nose, M.D.    Ct Angio Chest Pe W/cm &/or Wo Cm  11/15/2012  *RADIOLOGY REPORT*  Clinical Data: Chest pain, shortness of breath, and cough for 2 days.  The  CT ANGIOGRAPHY CHEST  Technique:  Multidetector CT imaging of the chest using the standard protocol during bolus administration of intravenous contrast. Multiplanar reconstructed images including MIPs were obtained and reviewed to evaluate the vascular anatomy.  Contrast: OMNIPAQUE IOHEXOL 350 MG/ML SOLN  Comparison: None.  Findings: Technically adequate study with good opacification of the central and segmental pulmonary arteries.  No focal filling defects.  No evidence of significant pulmonary embolus.  Normal heart size.  Normal caliber thoracic aorta.  The esophagus is mostly decompressed.  Scattered mediastinal and axillary lymph nodes are not pathologically enlarged.  Visualized portions of the upper abdominal organs are grossly unremarkable.  There are patchy bilateral perihilar and lower lobe infiltrates with airspace appearance.  There is a focal area of more dense consolidation in the lingular segment of the left lung which likely accounts for the infiltrations seen on previous chest radiograph. Changes could represent diffuse pneumonia, inflammatory process, or other infiltrative process.  There is diffuse appearing bronchial wall thickening suggesting chronic bronchitis.  Airways appear patent without evidence of mucus plugging.  No pleural effusion. No pneumothorax.  Hematocele in the right middle lung.  Normal alignment of thoracic vertebrae.  IMPRESSION: No evidence of significant pulmonary embolus.  Patchy bilateral perihilar and lower lung airspace infiltration.   Original Report Authenticated By: Burman Nieves, M.D.    Dg Chest Port 1 View  11/18/2012  *RADIOLOGY REPORT*  Clinical Data: Follow up  infiltrate  PORTABLE CHEST - 1 VIEW  Comparison: Prior chest x-ray 11/16/2012  Findings: Slightly improved patchy opacities in the right greater than left lung bases.  Stable borderline cardiomegaly.  Mild vascular congestion without overt edema.  No acute osseous abnormality.  IMPRESSION:  Improving patchy right worse than left bibasilar air space disease.   Original Report Authenticated By: Malachy Moan, M.D.    Dg Chest Port 1 View  11/16/2012  *RADIOLOGY REPORT*  Clinical Data: Evaluate pulmonary infiltrate  PORTABLE CHEST - 1 VIEW  Comparison: 11/15/2012; 06/06/2012; chest CT - 11/15/2012  Findings: Grossly unchanged cardiac silhouette and mediastinal contours.  Worsening bilateral mid and lower lung heterogeneous airspace opacities.  No definite pleural effusion or pneumothorax. Unchanged bones.  IMPRESSION: Worsening bilateral mid and lower lung airspace opacities concerning for progression of multifocal infection.  Original Report Authenticated By: Tacey Ruiz, MD    Dg Chest Port 1 View  11/15/2012  *RADIOLOGY REPORT*  Clinical Data: Chest pain, shortness breath  PORTABLE CHEST - 1 VIEW  Comparison: 06/06/2012  Findings: Patchy infiltrate or atelectasis in the left lower lung.  No effusion.  Heart size normal.  Regional bones unremarkable.  IMPRESSION:  Left lower lung patchy airspace infiltrate suggesting pneumonia.   Original Report Authenticated By: D. Andria Rhein, MD     Medications: Scheduled Meds:    . albuterol  2.5 mg Nebulization Q4H  . azithromycin  500 mg Intravenous Q24H  . benzonatate  100 mg Oral TID  . budesonide-formoterol  2 puff Inhalation BID  . cefTRIAXone (ROCEPHIN)  IV  1 g Intravenous Q24H  . citalopram  40 mg Oral Daily  . enoxaparin (LOVENOX) injection  40 mg Subcutaneous Q24H  . ipratropium  0.5 mg Nebulization Q4H  . methadone  110 mg Oral Daily  . methylPREDNISolone (SOLU-MEDROL) injection  60 mg Intravenous Q6H  . nicotine  21 mg Transdermal Daily       LOS: 5 days   Craige Patel M.D. [facility] 11/20/2012, 11:13 AM Pager: [phone]  If 7PM-7AM, please contact night-coverage [email or web address] Password [email or web address]

## 2012-11-21 ENCOUNTER — Encounter (HOSPITAL_COMMUNITY): Payer: Self-pay | Admitting: *Deleted

## 2012-11-21 DIAGNOSIS — J4489 Other specified chronic obstructive pulmonary disease: Secondary | ICD-10-CM

## 2012-11-21 DIAGNOSIS — E860 Dehydration: Secondary | ICD-10-CM

## 2012-11-21 DIAGNOSIS — J449 Chronic obstructive pulmonary disease, unspecified: Secondary | ICD-10-CM

## 2012-11-21 LAB — CULTURE, BLOOD (ROUTINE X 2): Culture: NO GROWTH

## 2012-11-21 MED ORDER — METHYLPREDNISOLONE SODIUM SUCC 125 MG IJ SOLR
60.0000 mg | Freq: Four times a day (QID) | INTRAMUSCULAR | Status: AC
  Administered 2012-11-21 (×2): 60 mg via INTRAVENOUS
  Filled 2012-11-21 (×3): qty 0.96

## 2012-11-21 MED ORDER — PREDNISONE 50 MG PO TABS
60.0000 mg | ORAL_TABLET | Freq: Every day | ORAL | Status: DC
  Administered 2012-11-22: 60 mg via ORAL
  Filled 2012-11-21 (×2): qty 1

## 2012-11-21 MED ORDER — PNEUMOCOCCAL VAC POLYVALENT 25 MCG/0.5ML IJ INJ
0.5000 mL | INJECTION | Freq: Once | INTRAMUSCULAR | Status: AC
  Administered 2012-11-21: 0.5 mL via INTRAMUSCULAR
  Filled 2012-11-21: qty 0.5

## 2012-11-21 NOTE — Progress Notes (Signed)
Patient ID: Wendy Douglas  female  RUE:[id number]    DOB: 05/31/1976    DOA: 11/15/2012  PCP: Default, Provider, MD  Assessment/Plan:  Principal Problem: Acute respiratory failure with hypoxia due to: still wheezy but a whole lot better today A) Influenza A (H1N1)  B) CAP (community acquired pneumonia)  C) patient has underlying COPD given her ongoing nicotine abuse COPD/ Smoker  -Chest x-ray 11/18/2012 showed bilateral airspace disease, continue Zithromax and Rocephin  - Continue albuterol and ipratropium nebs q6hours, flutter valve, incentive spirometry - Counselled strongly against tobacco use, continue nicotine patch  - Continue Tessalon Perles, added symbicort and IV solumedrol today. Will start prednisone tomorrow.  - Will do outpatient pulmonology referral  Chronic narcotic dependence  *Has been clean for more than 3 years-confirmed with [facility] patient's methadone dose which is 118 mg daily - DO NOT USE NARCOTICS OF ANY KIND   Dehydration/ Acute hyponatremia: resolved   Depression  -Continue home meds  DVT Prophylaxis:  Code Status:  Disposition: home O2 eval, case management for meds assistance and PCP.    Subjective: Significantly improved from yesterday, no chest pain for nausea and vomiting, abd pain  Objective: Weight change:   Intake/Output Summary (Last 24 hours) at 11/21/12 1415 Last data filed at 11/21/12 0654  Gross per 24 hour  Intake    700 ml  Output      0 ml  Net    700 ml   Blood pressure 123/60, pulse 75, temperature 97.7 F (36.5 C), temperature source Oral, resp. rate 17, height 5\' 6"  (1.676 m), weight 70.1 kg (154 lb 8.7 oz), last menstrual period 11/08/2012, SpO2 95.00%.  Physical Exam: General: Alert and awake, oriented x3, NAD CVS: S1-S2 clear, no mrg Chest: Bilateral expiratory wheezing, improved Abdomen: soft, NT, ND, NBS Extremities: no c/c/e bl   Lab Results: Basic Metabolic Panel:  Lab 11/20/12 1478 11/18/12 1020  NA 136 136    K 3.7 3.6  CL 95* 97  CO2 28 25  GLUCOSE 100* 154*  BUN 6 5*  CREATININE 0.50 0.49*  CALCIUM 9.6 9.5  MG -- --  PHOS -- --   CBC:  Lab 11/20/12 0720 11/18/12 1020 11/16/12 0530  WBC 8.2 5.9 --  NEUTROABS -- -- 10.9*  HGB 11.4* 12.4 --  HCT 33.9* 35.6* --  MCV 94.4 94.4 --  PLT 206 144* --   Cardiac Enzymes:  Lab 11/15/12 1738 11/15/12 0918 11/15/12 0530  CKTOTAL -- -- --  CKMB -- -- --  CKMBINDEX -- -- --  TROPONINI <0.30 <0.30 <0.30   BNP: No components found with this basename: POCBNP:2 CBG: No results found for this basename: GLUCAP:5 in the last 168 hours   Micro Results: Recent Results (from the past 240 hour(s))  CULTURE, BLOOD (ROUTINE X 2)     Status: Normal (Preliminary result)   Collection Time   11/15/12  1:36 AM      Component Value Range Status Comment   Specimen Description BLOOD LEFT ARM   Final    Special Requests BOTTLES DRAWN AEROBIC AND ANAEROBIC 5CC EA   Final    Culture  Setup Time 11/15/2012 08:43   Final    Culture     Final    Value:        BLOOD CULTURE RECEIVED NO GROWTH TO DATE CULTURE WILL BE HELD FOR 5 DAYS BEFORE ISSUING A FINAL NEGATIVE REPORT   Report Status PENDING   Incomplete   CULTURE, BLOOD (ROUTINE X  2)     Status: Normal (Preliminary result)   Collection Time   11/15/12  1:43 AM      Component Value Range Status Comment   Specimen Description BLOOD RIGHT ARM   Final    Special Requests BOTTLES DRAWN AEROBIC AND ANAEROBIC 5CC EA   Final    Culture  Setup Time 11/15/2012 08:43   Final    Culture     Final    Value:        BLOOD CULTURE RECEIVED NO GROWTH TO DATE CULTURE WILL BE HELD FOR 5 DAYS BEFORE ISSUING A FINAL NEGATIVE REPORT   Report Status PENDING   Incomplete     Studies/Results: Dg Chest 2 View  11/18/2012  *RADIOLOGY REPORT*  Clinical Data: Follow-up pneumonia.  CHEST - 2 VIEW  Comparison: 11/18/2012  Findings: Patchy bilateral airspace disease again noted, minimally improved.  Heart is normal size.  No  visible effusions or acute bony abnormality.  IMPRESSION: Slight improvement in patchy bilateral airspace disease.   Original Report Authenticated By: Charlett Nose, M.D.    Ct Angio Chest Pe W/cm &/or Wo Cm  11/15/2012  *RADIOLOGY REPORT*  Clinical Data: Chest pain, shortness of breath, and cough for 2 days.  The  CT ANGIOGRAPHY CHEST  Technique:  Multidetector CT imaging of the chest using the standard protocol during bolus administration of intravenous contrast. Multiplanar reconstructed images including MIPs were obtained and reviewed to evaluate the vascular anatomy.  Contrast: OMNIPAQUE IOHEXOL 350 MG/ML SOLN  Comparison: None.  Findings: Technically adequate study with good opacification of the central and segmental pulmonary arteries.  No focal filling defects.  No evidence of significant pulmonary embolus.  Normal heart size.  Normal caliber thoracic aorta.  The esophagus is mostly decompressed.  Scattered mediastinal and axillary lymph nodes are not pathologically enlarged.  Visualized portions of the upper abdominal organs are grossly unremarkable.  There are patchy bilateral perihilar and lower lobe infiltrates with airspace appearance.  There is a focal area of more dense consolidation in the lingular segment of the left lung which likely accounts for the infiltrations seen on previous chest radiograph. Changes could represent diffuse pneumonia, inflammatory process, or other infiltrative process.  There is diffuse appearing bronchial wall thickening suggesting chronic bronchitis.  Airways appear patent without evidence of mucus plugging.  No pleural effusion. No pneumothorax.  Hematocele in the right middle lung.  Normal alignment of thoracic vertebrae.  IMPRESSION: No evidence of significant pulmonary embolus.  Patchy bilateral perihilar and lower lung airspace infiltration.   Original Report Authenticated By: Burman Nieves, M.D.    Dg Chest Port 1 View  11/18/2012  *RADIOLOGY REPORT*   Clinical Data: Follow up infiltrate  PORTABLE CHEST - 1 VIEW  Comparison: Prior chest x-ray 11/16/2012  Findings: Slightly improved patchy opacities in the right greater than left lung bases.  Stable borderline cardiomegaly.  Mild vascular congestion without overt edema.  No acute osseous abnormality.  IMPRESSION:  Improving patchy right worse than left bibasilar air space disease.   Original Report Authenticated By: Malachy Moan, M.D.    Dg Chest Port 1 View  11/16/2012  *RADIOLOGY REPORT*  Clinical Data: Evaluate pulmonary infiltrate  PORTABLE CHEST - 1 VIEW  Comparison: 11/15/2012; 06/06/2012; chest CT - 11/15/2012  Findings: Grossly unchanged cardiac silhouette and mediastinal contours.  Worsening bilateral mid and lower lung heterogeneous airspace opacities.  No definite pleural effusion or pneumothorax. Unchanged bones.  IMPRESSION: Worsening bilateral mid and lower lung airspace opacities concerning  for progression of multifocal infection.   Original Report Authenticated By: Tacey Ruiz, MD    Dg Chest Port 1 View  11/15/2012  *RADIOLOGY REPORT*  Clinical Data: Chest pain, shortness breath  PORTABLE CHEST - 1 VIEW  Comparison: 06/06/2012  Findings: Patchy infiltrate or atelectasis in the left lower lung.  No effusion.  Heart size normal.  Regional bones unremarkable.  IMPRESSION:  Left lower lung patchy airspace infiltrate suggesting pneumonia.   Original Report Authenticated By: D. Andria Rhein, MD     Medications: Scheduled Meds:    . albuterol  2.5 mg Nebulization Q6H  . azithromycin  500 mg Intravenous Q24H  . benzonatate  100 mg Oral TID  . budesonide-formoterol  2 puff Inhalation BID  . cefTRIAXone (ROCEPHIN)  IV  1 g Intravenous Q24H  . citalopram  40 mg Oral Daily  . enoxaparin (LOVENOX) injection  40 mg Subcutaneous Q24H  . ipratropium  0.5 mg Nebulization Q6H  . methadone  110 mg Oral Daily  . methylPREDNISolone (SOLU-MEDROL) injection  60 mg Intravenous Q6H  . nicotine   21 mg Transdermal Daily      LOS: 6 days   RAI,RIPUDEEP M.D. [facility] 11/21/2012, 2:15 PM Pager: [phone]  If 7PM-7AM, please contact night-coverage [email or web address] Password [email or web address]

## 2012-11-22 LAB — CREATININE, SERUM: GFR calc Af Amer: >90 mL/min (ref >90)

## 2012-11-22 MED ORDER — POLYETHYLENE GLYCOL 3350 17 G PO PACK
17.0000 g | PACK | Freq: Once | ORAL | Status: DC
  Filled 2012-11-22: qty 1

## 2012-11-22 MED ORDER — BISACODYL 10 MG RE SUPP
10.0000 mg | Freq: Once | RECTAL | Status: AC
  Administered 2012-11-22: 10 mg via RECTAL
  Filled 2012-11-22: qty 1

## 2012-11-22 MED ORDER — BENZONATATE 100 MG PO CAPS: 100.0000 mg | ORAL_CAPSULE | Freq: Three times a day (TID) | ORAL | Status: DC | PRN

## 2012-11-22 MED ORDER — ALBUTEROL SULFATE (5 MG/ML) 0.5% IN NEBU
2.5000 mg | INHALATION_SOLUTION | Freq: Four times a day (QID) | RESPIRATORY_TRACT | Status: DC
  Administered 2012-11-22 (×3): 2.5 mg via RESPIRATORY_TRACT
  Filled 2012-11-22 (×3): qty 0.5

## 2012-11-22 MED ORDER — ALBUTEROL SULFATE (5 MG/ML) 0.5% IN NEBU: 2.5000 mg | INHALATION_SOLUTION | Freq: Three times a day (TID) | RESPIRATORY_TRACT | Status: DC

## 2012-11-22 MED ORDER — TIOTROPIUM BROMIDE MONOHYDRATE 18 MCG IN CAPS: 18.0000 ug | ORAL_CAPSULE | Freq: Every day | RESPIRATORY_TRACT | Status: DC

## 2012-11-22 MED ORDER — PREDNISONE (PAK) 10 MG PO TABS: ORAL_TABLET | ORAL | Status: DC

## 2012-11-22 MED ORDER — ALBUTEROL SULFATE HFA 108 (90 BASE) MCG/ACT IN AERS: 2.0000 | INHALATION_SPRAY | Freq: Four times a day (QID) | RESPIRATORY_TRACT | Status: DC | PRN

## 2012-11-22 MED ORDER — NICOTINE 21 MG/24HR TD PT24: 1.0000 | MEDICATED_PATCH | Freq: Every day | TRANSDERMAL | Status: DC

## 2012-11-22 MED ORDER — ALBUTEROL SULFATE HFA 108 (90 BASE) MCG/ACT IN AERS
2.0000 | INHALATION_SPRAY | Freq: Four times a day (QID) | RESPIRATORY_TRACT | Status: DC | PRN
  Filled 2012-11-22: qty 6.7

## 2012-11-22 MED ORDER — BUDESONIDE-FORMOTEROL FUMARATE 160-4.5 MCG/ACT IN AERO: 2.0000 | INHALATION_SPRAY | Freq: Two times a day (BID) | RESPIRATORY_TRACT | Status: DC

## 2012-11-22 MED ORDER — IPRATROPIUM BROMIDE 0.02 % IN SOLN
0.5000 mg | Freq: Four times a day (QID) | RESPIRATORY_TRACT | Status: DC
  Administered 2012-11-22 (×3): 0.5 mg via RESPIRATORY_TRACT
  Filled 2012-11-22 (×3): qty 2.5

## 2012-11-22 NOTE — Progress Notes (Signed)
Pt was ambulated in the hallway without 02 on at desaturated to 88% and heart rate of 120. Patient was ambulated back to room and placed back on 02 at 2 liters and returned to 92% heart rate of 103 with deep breathing exercises.

## 2012-11-22 NOTE — Progress Notes (Signed)
SATURATION QUALIFICATIONS: (This note is used to comply with regulatory documentation for home oxygen)  Patient Saturations on Room Air at Rest =90  Patient Saturations on Room Air while Ambulating = 88  Patient Saturations on 2 Liters of oxygen while Ambulating = 94  Please briefly explain why patient needs home oxygen: pt needs 02 at home to completely daily activities

## 2012-11-22 NOTE — Progress Notes (Signed)
Pt to d/c home with multiple resp meds. RN can send the inhalers pt has used here in hospital with her. Nicotine patches are not filled through [facility] program.  Only other med needing to be filled is on [facility] $4 list so pt can fill that at time of discharge.   Home oxygen arranged with [facility] according to pt insurance requirements. Called DME rep in hospital re: order.

## 2012-11-22 NOTE — Discharge Summary (Addendum)
Physician Discharge Summary  Patient ID: Wendy Douglas MRN: [id number] DOB/AGE: 01-20-1976 37 y.o.  Admit date: 11/15/2012 Discharge date: 11/22/2012  Primary Care Physician:  Default, Provider, MD  Discharge Diagnoses:    . Chronic narcotic dependence . Smoker . Depression . CAP (community acquired pneumonia) . Acute respiratory failure with hypoxia . Influenza A (H1N1) . COPD . Dehydration . Leukocytosis . Acute hyponatremia . SIRS (systemic inflammatory response syndrome)  Consults:    Discharge Medications:   Medication List     As of 11/22/2012 11:08 AM    TAKE these medications         albuterol 108 (90 BASE) MCG/ACT inhaler   Commonly known as: PROVENTIL HFA;VENTOLIN HFA   Inhale 2 puffs into the lungs every 6 (six) hours as needed for wheezing or shortness of breath.      albuterol (5 MG/ML) 0.5% nebulizer solution   Commonly known as: PROVENTIL   Take 0.5 mLs (2.5 mg total) by nebulization 3 (three) times daily.      benzonatate 100 MG capsule   Commonly known as: TESSALON   Take 1 capsule (100 mg total) by mouth 3 (three) times daily as needed for cough.      budesonide-formoterol 160-4.5 MCG/ACT inhaler   Commonly known as: SYMBICORT   Inhale 2 puffs into the lungs 2 (two) times daily.      citalopram 40 MG tablet   Commonly known as: CELEXA   Take 40 mg by mouth daily.      methadone 10 MG/ML solution   Commonly known as: DOLOPHINE   Take 118 mg by mouth daily.      nicotine 21 mg/24hr patch   Commonly known as: NICODERM CQ - dosed in mg/24 hours   Place 1 patch onto the skin daily.      predniSONE 10 MG tablet   Commonly known as: STERAPRED UNI-PAK   Prednisone dosing: Take  Prednisone 40mg  (4 tabs) x 3 days, then taper to 30mg  (3 tabs) x 3 days, then 20mg  (2 tabs) x 3days, then 10mg  (1 tab) x 3days, then OFF.    Dispense:  30 tabs, refills: None      thiothixene 2 MG capsule   Commonly known as: NAVANE   Take 4 mg by mouth daily.           Brief H and P: For complete details please refer to admission H and P, but in brief Wendy Douglas is a 37 y.o. female, who is pack-a-day smoker, history of narcotic abuse in the past, depression, who did not take a flu shot this year and had been having runny nose for the last 3-4 days and since yesterday developed a productive cough along with high-grade fevers and shortness of breath, presented to the ER with similar symptoms -- ER she was found to be in acute respiratory failure with extreme tachycardia and respiratory distress, her workup was consistent with community acquired pneumonia along with generalized wheezing suggesting COPD exacerbation, pulmonary critical care was consulted who requested step-down admission by internal medicine.    Hospital Course:  Acute respiratory failure with hypoxia due to A) Influenza A (H1N1)  B) CAP (community acquired pneumonia)  C) patient appears to have underlying COPD given her ongoing nicotine abuse COPD/ Smoker  Chest x-ray 11/18/2012 showed bilateral airspace disease, patient has finished antibiotics course with Zithromax and Rocephin and 5 days of tamiflu. She was placed on    albuterol and ipratropium nebs q6hours, flutter valve, incentive spirometry.  She was strongly counselled against tobacco use and placed on nicotine patch.  She was discharged on prednisone taper and follow-up with pulmonology was made. Patient needed 2L home O2 at discharge.  Chronic narcotic dependence  *Has been clean for more than 3 years-confirmed with [facility] patient's methadone dose which is 118 mg daily   Dehydration/ Acute hyponatremia: resolved     Day of Discharge BP 126/68  Pulse 74  Temp 97.8 F (36.6 C) (Oral)  Resp 17  Ht 5\' 6"  (1.676 m)  Wt 70.1 kg (154 lb 8.7 oz)  BMI 24.94 kg/m2  SpO2 97%  LMP 11/08/2012  Physical Exam: General: Alert and awake oriented x3 not in any acute distress. HEENT: anicteric sclera, pupils reactive to light and  accommodation CVS: S1-S2 clear no murmur rubs or gallops Chest: clear to auscultation bilaterally, no wheezing rales or rhonchi Abdomen: soft nontender, nondistended, normal bowel sounds, no organomegaly Extremities: no cyanosis, clubbing or edema noted bilaterally Neuro: Cranial nerves II-XII intact, no focal neurological deficits   The results of significant diagnostics from this hospitalization (including imaging, microbiology, ancillary and laboratory) are listed below for reference.    LAB RESULTS: Basic Metabolic Panel:  Lab 11/22/12 1610 11/20/12 0720 11/18/12 1020  NA -- 136 136  K -- 3.7 3.6  CL -- 95* 97  CO2 -- 28 25  GLUCOSE -- 100* 154*  BUN -- 6 5*  CREATININE 0.43* 0.50 --  CALCIUM -- 9.6 9.5  MG -- -- --  PHOS -- -- --   CBC:  Lab 11/20/12 0720 11/18/12 1020 11/16/12 0530  WBC 8.2 5.9 --  NEUTROABS -- -- 10.9*  HGB 11.4* 12.4 --  HCT 33.9* 35.6* --  MCV 94.4 -- --  PLT 206 144* --   Cardiac Enzymes:  Lab 11/15/12 1738  CKTOTAL --  CKMB --  CKMBINDEX --  TROPONINI <0.30   BNP: No components found with this basename: POCBNP:2 CBG: No results found for this basename: GLUCAP:2 in the last 168 hours  Significant Diagnostic Studies:  Ct Angio Chest Pe W/cm &/or Wo Cm  11/15/2012  *RADIOLOGY REPORT*  Clinical Data: Chest pain, shortness of breath, and cough for 2 days.  The  CT ANGIOGRAPHY CHEST  Technique:  Multidetector CT imaging of the chest using the standard protocol during bolus administration of intravenous contrast. Multiplanar reconstructed images including MIPs were obtained and reviewed to evaluate the vascular anatomy.  Contrast: OMNIPAQUE IOHEXOL 350 MG/ML SOLN  Comparison: None.  Findings: Technically adequate study with good opacification of the central and segmental pulmonary arteries.  No focal filling defects.  No evidence of significant pulmonary embolus.  Normal heart size.  Normal caliber thoracic aorta.  The esophagus is mostly  decompressed.  Scattered mediastinal and axillary lymph nodes are not pathologically enlarged.  Visualized portions of the upper abdominal organs are grossly unremarkable.  There are patchy bilateral perihilar and lower lobe infiltrates with airspace appearance.  There is a focal area of more dense consolidation in the lingular segment of the left lung which likely accounts for the infiltrations seen on previous chest radiograph. Changes could represent diffuse pneumonia, inflammatory process, or other infiltrative process.  There is diffuse appearing bronchial wall thickening suggesting chronic bronchitis.  Airways appear patent without evidence of mucus plugging.  No pleural effusion. No pneumothorax.  Hematocele in the right middle lung.  Normal alignment of thoracic vertebrae.  IMPRESSION: No evidence of significant pulmonary embolus.  Patchy bilateral perihilar and lower lung airspace  infiltration.   Original Report Authenticated By: Burman Nieves, M.D.    Dg Chest Port 1 View  11/15/2012  *RADIOLOGY REPORT*  Clinical Data: Chest pain, shortness breath  PORTABLE CHEST - 1 VIEW  Comparison: 06/06/2012  Findings: Patchy infiltrate or atelectasis in the left lower lung.  No effusion.  Heart size normal.  Regional bones unremarkable.  IMPRESSION:  Left lower lung patchy airspace infiltrate suggesting pneumonia.   Original Report Authenticated By: D. Andria Rhein, MD      Disposition and Follow-up:     Discharge Orders    Future Appointments: Provider: Department: Dept Phone: Center:   12/01/2012 3:15 PM Nyoka Cowden, MD [facility] [phone] None     Future Orders Please Complete By Expires   Diet - low sodium heart healthy      Increase activity slowly      Discharge instructions      Comments:   Please continue Albuterol Nebs (breathing treatment) three times/day for another week, then continue twice a day or per pulmonology recommendations.       DISPOSITION: home DIET:  regular ACTIVITY: as tolerated   DISCHARGE FOLLOW-UP Follow-up Information    Follow up with Sandrea Hughs, MD. On 12/01/2012. (at 3:15pm, please arrive 10 mins early.)    Contact information:   [address] [place] [address] [phone]          Time spent on Discharge: 35 mins  Signed:   RAI,RIPUDEEP M.D. [facility] 11/22/2012, 11:08 AM Pager: [phone]    Addendum: SIRS (systemic inflammatory response syndrome) was present at the time of admission BP 107/61, heart rate 111, temperature 102.4 likely due to community-acquired pneumonia and influenza A. H1 N1. This was resolved prior to discharge.  RAI,RIPUDEEP M.D. [facility] 12/01/12, 1:31 PM

## 2012-11-22 NOTE — Progress Notes (Signed)
Delivered and instructed patient on O2 in room.    Patient all set for O2 and will have rest of equipment delivered to the patient's home.   Orlan Leavens [phone]

## 2012-12-01 ENCOUNTER — Ambulatory Visit (INDEPENDENT_AMBULATORY_CARE_PROVIDER_SITE_OTHER): Payer: Self-pay | Admitting: Internal Medicine

## 2012-12-01 ENCOUNTER — Encounter: Payer: Self-pay | Admitting: Internal Medicine

## 2012-12-01 VITALS — BP 110/70 | HR 86 | Temp 98.0°F | Ht 66.0 in | Wt 157.0 lb

## 2012-12-01 DIAGNOSIS — J9601 Acute respiratory failure with hypoxia: Secondary | ICD-10-CM

## 2012-12-01 DIAGNOSIS — J9600 Acute respiratory failure, unspecified whether with hypoxia or hypercapnia: Secondary | ICD-10-CM

## 2012-12-01 DIAGNOSIS — J189 Pneumonia, unspecified organism: Secondary | ICD-10-CM

## 2012-12-01 DIAGNOSIS — J449 Chronic obstructive pulmonary disease, unspecified: Secondary | ICD-10-CM

## 2012-12-01 NOTE — Patient Instructions (Addendum)
Please remember to go to the  x-ray department downstairs for your tests - we will call you with the results when they are available.  Taper prednisone off and after a week and if doing great then begin symbicort 160 2 puffs every 12 hours.  The key is to stop smoking completely before smoking completely stops you!   Please schedule a follow up office visit in 4 weeks, sooner if needed

## 2012-12-01 NOTE — Progress Notes (Signed)
Subjective:     Patient ID: Wendy Douglas, female   DOB: 12-05-1975  MRN: [id number]  HPI  37 yowf active smoker with some episodes of bronchitis in past but never chronically ill or dependent on 02 and any respiratory meds at all then admitted to [facility]  Admit date: 11/15/2012  Discharge date: 11/22/2012  Primary Care Physician: Default, Provider, MD  Discharge Diagnoses:   . Chronic narcotic dependence . Smoker . Depression . CAP (community acquired pneumonia) . Acute respiratory failure with hypoxia . Influenza A (H1N1) . COPD . Dehydration . Leukocytosis . Acute hyponatremia . SIRS (systemic inflammatory response syndrome)  Consults:  Discharge Medications:    Medication List      As of 11/22/2012 11:08 AM     TAKE these medications        albuterol 108 (90 BASE) MCG/ACT inhaler     Commonly known as: PROVENTIL HFA;VENTOLIN HFA     Inhale 2 puffs into the lungs every 6 (six) hours as needed for wheezing or shortness of breath.     albuterol (5 MG/ML) 0.5% nebulizer solution     Commonly known as: PROVENTIL     Take 0.5 mLs (2.5 mg total) by nebulization 3 (three) times daily.     benzonatate 100 MG capsule     Commonly known as: TESSALON     Take 1 capsule (100 mg total) by mouth 3 (three) times daily as needed for cough.     budesonide-formoterol 160-4.5 MCG/ACT inhaler     Commonly known as: SYMBICORT     Inhale 2 puffs into the lungs 2 (two) times daily.     citalopram 40 MG tablet     Commonly known as: CELEXA     Take 40 mg by mouth daily.     methadone 10 MG/ML solution     Commonly known as: DOLOPHINE     Take 118 mg by mouth daily.     nicotine 21 mg/24hr patch     Commonly known as: NICODERM CQ - dosed in mg/24 hours     Place 1 patch onto the skin daily.     predniSONE 10 MG tablet     Commonly known as: STERAPRED UNI-PAK     Prednisone dosing: Take Prednisone 40mg  (4 tabs) x 3 days, then taper to 30mg  (3 tabs) x 3 days, then 20mg  (2 tabs) x 3days, then  10mg  (1 tab) x 3days, then OFF.  Dispense: 30 tabs, refills: None     thiothixene 2 MG capsule     Commonly known as: NAVANE     Take 4 mg by mouth daily.     Brief H and P:  For complete details please refer to admission H and P, but in brief Wendy Douglas is a 37 y.o. female, who is pack-a-day smoker, history of narcotic abuse in the past, depression, who did not take a flu shot this year and had been having runny nose for the last 3-4 days and since yesterday developed a productive cough along with high-grade fevers and shortness of breath, presented to the ER with similar symptoms -- ER she was found to be in acute respiratory failure with extreme tachycardia and respiratory distress, her workup was consistent with community acquired pneumonia along with generalized wheezing suggesting COPD exacerbation, pulmonary critical care was consulted who requested step-down admission by internal medicine.  Hospital Course:  Acute respiratory failure with hypoxia due to A) Influenza A (H1N1)  B) CAP (community acquired pneumonia)  C) patient appears to have underlying COPD given her ongoing nicotine abuse COPD/ Smoker  Chest x-ray 11/18/2012 showed bilateral airspace disease, patient has finished antibiotics course with Zithromax and Rocephin and 5 days of tamiflu. She was placed on  albuterol and ipratropium nebs q6hours, flutter valve, incentive spirometry. She was strongly counselled against tobacco use and placed on nicotine patch.  She was discharged on prednisone taper and follow-up with pulmonology was made. Patient needed 2L home O2 at discharge.  Chronic narcotic dependence  *Has been clean for more than 3 years-confirmed with [facility] patient's methadone dose which is 118 mg daily  Dehydration/ Acute hyponatremia: resolved   12/01/2012 1st pulmonary eval on 02 and symbicort and prednisone to 30 mg on day of ov and breathing 100% better and not on albuterol or neb and not using 02 since 2 days except  with exertion.  Sleeping ok without nocturnal  or early am exacerbation  of respiratory  c/o's or need for noct saba. Also denies any obvious fluctuation of symptoms with weather or environmental changes or other aggravating or alleviating factors except as outlined above    Review of Systems     Objective:   Physical Exam    amb wf nad  Wt Readings from Last 3 Encounters:  12/01/12 157 lb (71.215 kg)  11/18/12 154 lb 8.7 oz (70.1 kg)     HEENT: nl dentition, turbinates, and orophanx. Nl external ear canals without cough reflex   NECK :  without JVD/Nodes/TM/ nl carotid upstrokes bilaterally   LUNGS: no acc muscle use, clear to A and P bilaterally without cough on insp or exp maneuvers   CV:  RRR  no s3 or murmur or increase in P2, no edema   ABD:  soft and nontender with nl excursion in the supine position. No bruits or organomegaly, bowel sounds nl  MS:  warm without deformities, calf tenderness, cyanosis or clubbing  SKIN: warm and dry without lesions    NEURO:  alert, approp, no deficits    cxr 11/18/12  Slight improvement in patchy bilateral airspace disease.    CXR  12/01/2012 : Did not go for cxr as requested Assessment:          Plan:

## 2012-12-02 NOTE — Assessment & Plan Note (Signed)
Doubt she has significant copd but needs baseline pfts  I reviewed the Flethcher curve with patient that basically indicates  if you quit smoking when your best day FEV1 is still well preserved (which hers appears to be) it is highly unlikely you will progress to severe disease and informed the patient there was no medication on the market that has proven to change the curve or the likelihood of progression.  Therefore stopping smoking and maintaining abstinence is the most important aspect of care, not choice of inhalers or for that matter, doctors.

## 2012-12-02 NOTE — Assessment & Plan Note (Signed)
Clinically resolved  But needs f/u cxr

## 2012-12-02 NOTE — Assessment & Plan Note (Signed)
Resolved at f/u ov 12/01/12 > d/c 02

## 2013-05-16 ENCOUNTER — Encounter (HOSPITAL_COMMUNITY): Payer: Self-pay | Admitting: Internal Medicine

## 2013-05-16 ENCOUNTER — Inpatient Hospital Stay (HOSPITAL_COMMUNITY)
Admission: EM | Admit: 2013-05-16 | Discharge: 2013-05-17 | DRG: 917 | Disposition: A | Discharge status: Home / Self Care | Payer: MEDICAID | Attending: Family Medicine | Admitting: Family Medicine

## 2013-05-16 ENCOUNTER — Emergency Department (HOSPITAL_COMMUNITY): Payer: Self-pay

## 2013-05-16 DIAGNOSIS — J9600 Acute respiratory failure, unspecified whether with hypoxia or hypercapnia: Secondary | ICD-10-CM | POA: Diagnosis present

## 2013-05-16 DIAGNOSIS — E872 Acidosis, unspecified: Secondary | ICD-10-CM | POA: Diagnosis present

## 2013-05-16 DIAGNOSIS — J708 Respiratory conditions due to other specified external agents: Secondary | ICD-10-CM

## 2013-05-16 DIAGNOSIS — E876 Hypokalemia: Secondary | ICD-10-CM | POA: Diagnosis present

## 2013-05-16 DIAGNOSIS — F1920 Other psychoactive substance dependence, uncomplicated: Secondary | ICD-10-CM | POA: Diagnosis present

## 2013-05-16 DIAGNOSIS — F17200 Nicotine dependence, unspecified, uncomplicated: Secondary | ICD-10-CM | POA: Diagnosis present

## 2013-05-16 DIAGNOSIS — T594X4A Toxic effect of chlorine gas, undetermined, initial encounter: Principal | ICD-10-CM | POA: Diagnosis present

## 2013-05-16 DIAGNOSIS — F1120 Opioid dependence, uncomplicated: Secondary | ICD-10-CM | POA: Diagnosis present

## 2013-05-16 DIAGNOSIS — F3289 Other specified depressive episodes: Secondary | ICD-10-CM | POA: Diagnosis present

## 2013-05-16 DIAGNOSIS — R579 Shock, unspecified: Secondary | ICD-10-CM | POA: Diagnosis present

## 2013-05-16 DIAGNOSIS — T5991XA Toxic effect of unspecified gases, fumes and vapors, accidental (unintentional), initial encounter: Secondary | ICD-10-CM | POA: Diagnosis present

## 2013-05-16 DIAGNOSIS — J449 Chronic obstructive pulmonary disease, unspecified: Secondary | ICD-10-CM | POA: Diagnosis present

## 2013-05-16 DIAGNOSIS — T594X1A Toxic effect of chlorine gas, accidental (unintentional), initial encounter: Secondary | ICD-10-CM | POA: Diagnosis present

## 2013-05-16 DIAGNOSIS — F329 Major depressive disorder, single episode, unspecified: Secondary | ICD-10-CM | POA: Diagnosis present

## 2013-05-16 DIAGNOSIS — J4489 Other specified chronic obstructive pulmonary disease: Secondary | ICD-10-CM | POA: Diagnosis present

## 2013-05-16 HISTORY — DX: Shortness of breath: R06.02

## 2013-05-16 HISTORY — DX: Unspecified asthma, uncomplicated: J45.909

## 2013-05-16 LAB — POCT I-STAT 3, VENOUS BLOOD GAS (G3P V)
Acid-base deficit: 6 mmol/L — ABNORMAL HIGH (ref 0.0–2.0)
O2 Saturation: 92 %

## 2013-05-16 LAB — BASIC METABOLIC PANEL
BUN: 11 mg/dL (ref 6–23)
BUN: 11 mg/dL (ref 6–23)
Calcium: 8.4 mg/dL (ref 8.4–10.5)
Calcium: 8.3 mg/dL — ABNORMAL LOW (ref 8.4–10.5)
Chloride: 101 mEq/L (ref 96–112)
Creatinine, Ser: 0.76 mg/dL (ref 0.50–1.10)
Creatinine, Ser: 0.67 mg/dL (ref 0.50–1.10)
GFR calc Af Amer: >90 mL/min (ref >90)
GFR calc Af Amer: >90 mL/min (ref >90)
GFR calc non Af Amer: >90 mL/min (ref >90)
Glucose, Bld: 156 mg/dL — ABNORMAL HIGH (ref 70–99)

## 2013-05-16 LAB — RAPID URINE DRUG SCREEN, HOSP PERFORMED
Amphetamines: NOT DETECTED
Barbiturates: NOT DETECTED
Benzodiazepines: NOT DETECTED
Cocaine: NOT DETECTED

## 2013-05-16 LAB — CBC
HCT: 35.9 % — ABNORMAL LOW (ref 36.0–46.0)
HCT: 33.8 % — ABNORMAL LOW (ref 36.0–46.0)
Hemoglobin: 12.7 g/dL (ref 12.0–15.0)
MCH: 33.2 pg (ref 26.0–34.0)
MCHC: 35.4 g/dL (ref 30.0–36.0)
MCHC: 35.2 g/dL (ref 30.0–36.0)
MCV: 93.6 fL (ref 78.0–100.0)
Platelets: 130 10*3/uL — ABNORMAL LOW (ref 150–400)
RDW: 13.6 % (ref 11.5–15.5)
RDW: 13.4 % (ref 11.5–15.5)

## 2013-05-16 LAB — PREGNANCY, URINE: Preg Test, Ur: NEGATIVE

## 2013-05-16 LAB — CG4 I-STAT (LACTIC ACID): Lactic Acid, Venous: 6.31 mmol/L — ABNORMAL HIGH (ref 0.5–2.2)

## 2013-05-16 MED ORDER — ALBUTEROL SULFATE (5 MG/ML) 0.5% IN NEBU: 2.5000 mg | INHALATION_SOLUTION | RESPIRATORY_TRACT | Status: DC | PRN

## 2013-05-16 MED ORDER — DIPHENHYDRAMINE HCL 50 MG/ML IJ SOLN
INTRAMUSCULAR | Status: AC
  Filled 2013-05-16: qty 1

## 2013-05-16 MED ORDER — METHADONE HCL 10 MG PO TABS
120.0000 mg | ORAL_TABLET | Freq: Every day | ORAL | Status: DC
  Administered 2013-05-16 – 2013-05-17 (×2): 120 mg via ORAL
  Filled 2013-05-16 (×2): qty 12

## 2013-05-16 MED ORDER — METHYLPREDNISOLONE SODIUM SUCC 125 MG IJ SOLR
60.0000 mg | Freq: Three times a day (TID) | INTRAMUSCULAR | Status: DC
  Administered 2013-05-16: 60 mg via INTRAVENOUS
  Filled 2013-05-16 (×2): qty 0.96

## 2013-05-16 MED ORDER — ONDANSETRON HCL 4 MG PO TABS: 4.0000 mg | ORAL_TABLET | Freq: Four times a day (QID) | ORAL | Status: DC | PRN

## 2013-05-16 MED ORDER — NICOTINE 21 MG/24HR TD PT24
21.0000 mg | MEDICATED_PATCH | Freq: Every day | TRANSDERMAL | Status: DC
  Administered 2013-05-16 – 2013-05-17 (×2): 21 mg via TRANSDERMAL
  Filled 2013-05-16 (×2): qty 1

## 2013-05-16 MED ORDER — THIOTHIXENE 2 MG PO CAPS
4.0000 mg | ORAL_CAPSULE | Freq: Every day | ORAL | Status: DC
  Administered 2013-05-16 – 2013-05-17 (×2): 4 mg via ORAL
  Filled 2013-05-16 (×2): qty 2

## 2013-05-16 MED ORDER — ARIPIPRAZOLE 2 MG PO TABS
2.0000 mg | ORAL_TABLET | Freq: Every day | ORAL | Status: DC
  Administered 2013-05-16 – 2013-05-17 (×2): 2 mg via ORAL
  Filled 2013-05-16 (×2): qty 1

## 2013-05-16 MED ORDER — IPRATROPIUM BROMIDE 0.02 % IN SOLN
0.5000 mg | RESPIRATORY_TRACT | Status: DC
  Administered 2013-05-16 (×2): 0.5 mg via RESPIRATORY_TRACT
  Filled 2013-05-16 (×2): qty 2.5

## 2013-05-16 MED ORDER — DIPHENHYDRAMINE HCL 50 MG/ML IJ SOLN
50.0000 mg | Freq: Once | INTRAMUSCULAR | Status: AC
  Administered 2013-05-16: 50 mg via INTRAVENOUS

## 2013-05-16 MED ORDER — CITALOPRAM HYDROBROMIDE 40 MG PO TABS
40.0000 mg | ORAL_TABLET | Freq: Every day | ORAL | Status: DC
  Administered 2013-05-16 – 2013-05-17 (×2): 40 mg via ORAL
  Filled 2013-05-16 (×2): qty 1

## 2013-05-16 MED ORDER — ONDANSETRON HCL 4 MG/2ML IJ SOLN: 4.0000 mg | Freq: Four times a day (QID) | INTRAMUSCULAR | Status: DC | PRN

## 2013-05-16 MED ORDER — ALBUTEROL (5 MG/ML) CONTINUOUS INHALATION SOLN
10.0000 mg/h | INHALATION_SOLUTION | RESPIRATORY_TRACT | Status: DC
  Administered 2013-05-16: 10 mg/h via RESPIRATORY_TRACT

## 2013-05-16 MED ORDER — METHYLPREDNISOLONE SODIUM SUCC 125 MG IJ SOLR
125.0000 mg | INTRAMUSCULAR | Status: AC
  Administered 2013-05-16: 125 mg via INTRAVENOUS

## 2013-05-16 MED ORDER — METHYLPREDNISOLONE SODIUM SUCC 125 MG IJ SOLR
INTRAMUSCULAR | Status: AC
  Filled 2013-05-16: qty 2

## 2013-05-16 MED ORDER — EPINEPHRINE 0.3 MG/0.3ML IJ SOAJ
0.3000 mg | Freq: Once | INTRAMUSCULAR | Status: AC
  Administered 2013-05-16: 0.3 mg via INTRAMUSCULAR

## 2013-05-16 MED ORDER — DEXAMETHASONE SODIUM PHOSPHATE 10 MG/ML IJ SOLN
INTRAMUSCULAR | Status: AC
  Filled 2013-05-16: qty 1

## 2013-05-16 MED ORDER — ALBUTEROL (5 MG/ML) CONTINUOUS INHALATION SOLN
INHALATION_SOLUTION | RESPIRATORY_TRACT | Status: AC
  Filled 2013-05-16: qty 20

## 2013-05-16 MED ORDER — IPRATROPIUM BROMIDE 0.02 % IN SOLN
RESPIRATORY_TRACT | Status: AC
  Filled 2013-05-16: qty 5

## 2013-05-16 MED ORDER — FAMOTIDINE IN NACL 20-0.9 MG/50ML-% IV SOLN
20.0000 mg | Freq: Once | INTRAVENOUS | Status: AC
  Administered 2013-05-16: 20 mg via INTRAVENOUS
  Filled 2013-05-16: qty 50

## 2013-05-16 MED ORDER — ALBUTEROL SULFATE (5 MG/ML) 0.5% IN NEBU
2.5000 mg | INHALATION_SOLUTION | RESPIRATORY_TRACT | Status: DC
  Administered 2013-05-16 (×2): 2.5 mg via RESPIRATORY_TRACT
  Filled 2013-05-16: qty 0.5

## 2013-05-16 MED ORDER — SODIUM CHLORIDE 0.9 % IV BOLUS (SEPSIS)
1000.0000 mL | Freq: Once | INTRAVENOUS | Status: AC
  Administered 2013-05-16: 1000 mL via INTRAVENOUS

## 2013-05-16 MED ORDER — IPRATROPIUM BROMIDE 0.02 % IN SOLN
0.5000 mg | Freq: Three times a day (TID) | RESPIRATORY_TRACT | Status: DC
  Administered 2013-05-16 – 2013-05-17 (×3): 0.5 mg via RESPIRATORY_TRACT
  Filled 2013-05-16 (×3): qty 2.5

## 2013-05-16 MED ORDER — ACETAMINOPHEN 325 MG PO TABS: 650.0000 mg | ORAL_TABLET | Freq: Four times a day (QID) | ORAL | Status: DC | PRN

## 2013-05-16 MED ORDER — ALBUTEROL SULFATE (5 MG/ML) 0.5% IN NEBU
2.5000 mg | INHALATION_SOLUTION | Freq: Three times a day (TID) | RESPIRATORY_TRACT | Status: DC
  Administered 2013-05-16 – 2013-05-17 (×3): 2.5 mg via RESPIRATORY_TRACT
  Filled 2013-05-16 (×3): qty 0.5

## 2013-05-16 MED ORDER — ALBUTEROL SULFATE (5 MG/ML) 0.5% IN NEBU
5.0000 mg | INHALATION_SOLUTION | Freq: Once | RESPIRATORY_TRACT | Status: AC
  Administered 2013-05-16: 5 mg via RESPIRATORY_TRACT
  Filled 2013-05-16: qty 1

## 2013-05-16 MED ORDER — ENOXAPARIN SODIUM 40 MG/0.4ML ~~LOC~~ SOLN
40.0000 mg | SUBCUTANEOUS | Status: DC
  Administered 2013-05-17: 40 mg via SUBCUTANEOUS
  Filled 2013-05-16: qty 0.4

## 2013-05-16 MED ORDER — IPRATROPIUM BROMIDE 0.02 % IN SOLN
1.0000 mg | Freq: Once | RESPIRATORY_TRACT | Status: AC
  Administered 2013-05-16: 1 mg via RESPIRATORY_TRACT

## 2013-05-16 MED ORDER — POTASSIUM CHLORIDE CRYS ER 20 MEQ PO TBCR
40.0000 meq | EXTENDED_RELEASE_TABLET | Freq: Once | ORAL | Status: AC
  Administered 2013-05-16: 40 meq via ORAL
  Filled 2013-05-16: qty 2

## 2013-05-16 MED ORDER — PREDNISONE 20 MG PO TABS
40.0000 mg | ORAL_TABLET | Freq: Every day | ORAL | Status: DC
  Administered 2013-05-17: 40 mg via ORAL
  Filled 2013-05-16 (×2): qty 2

## 2013-05-16 MED ORDER — ACETAMINOPHEN 650 MG RE SUPP: 650.0000 mg | Freq: Four times a day (QID) | RECTAL | Status: DC | PRN

## 2013-05-16 MED ORDER — GUAIFENESIN-DM 100-10 MG/5ML PO SYRP: 5.0000 mL | ORAL_SOLUTION | ORAL | Status: DC | PRN

## 2013-05-16 NOTE — Progress Notes (Signed)
2:00 PM I agree with HPI/GPe and A/P per Dr. Andreas Blower        Patient allegedly was at a Park at [facility] and was about to sit on a bench and was trying to move a container of something. She opened a container and took a couple of deep breaths of it and experienced subacute to acute respiratory distress. Could not breathe and seemed to become blue in the mouth and face. Boyfriend rushed patient over to [facility] emergency room, where she was given treatment for what seemed to be an acute anaphylactic reaction. She was significantly noted to be much better subsequently, but needed albuterol.  Other concerns for acute toxicity pulmonary edema secondary to chlorine inhalation, was admitted. States she feels much better. The last short of breath, but little jittery, secondary to the steroids/chlorine. Feels close to her normal self. No chest pain. No shortness of currently. No vomiting. No blurred vision, or double vision  Patient Active Problem List   Diagnosis Date Noted  . Acute respiratory failure 05/16/2013  . 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 Hypokalemia 05/16/2013  . CAP (community acquired pneumonia) 11/15/2012  . Acute respiratory failure with hypoxia 11/15/2012  . Influenza A (H1N1) 11/15/2012  . COPD 11/15/2012  . Dehydration 11/15/2012  . Leukocytosis 11/15/2012  . Acute hyponatremia 11/15/2012  . SIRS (systemic inflammatory response syndrome) 11/15/2012  . Chronic narcotic dependence   . Smoker   . Depression    A/p Likely chlorine toxicity secondary to inhalation. Treatment is primarily supportive, steroids can be probably transitioned to inhalational steroids or she can have a slow taper of. She does have 18 year history of smoking one pack per day and states that the nicotine patches are helping. Potentially discharged a.m. if still stable  Pleas Koch, MD [facility] [phone]

## 2013-05-16 NOTE — ED Provider Notes (Signed)
History    CSN: [id number] Arrival date & time 05/16/13  0136  First MD Initiated Contact with Patient 05/16/13 0159     Chief Complaint  Patient presents with  . Shortness of Breath   (Consider location/radiation/quality/duration/timing/severity/associated sxs/prior Treatment) HPI  He said that this is a late entry. This patient was seen by me immediately upon her presentation to the emergency department. The patient is a 37 year old woman who is brought to the emergency department by her boyfriend. The patient was experiencing acute respiratory failure and was unable to speak and give history. 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 Thus, it was the same that the patient was experiencing anaphylaxis secondary to bee sting.   After the patient responded to treatment, she explained that she was sitting by a pool and it was dark out. She noticed a box and opened it because she was curious to find out what was inside. The box contained chlorine and the patient inhaled a significant amount of aerosolized chlorine.  She immediately developed severe SOB.    Past Medical History  Diagnosis Date  . Depression   . Smoker   . Chronic narcotic dependence   . COPD (chronic obstructive pulmonary disease)    No past surgical history on file. No family history on file. History  Substance Use Topics  . Smoking status: Current Some Day Smoker    Types: Cigarettes  . Smokeless tobacco: Not on file     Comment: 1-2 puffs from time to time  . Alcohol Use: No   OB History   Grav Para Term Preterm Abortions TAB SAB Ect Mult Living                 Review of Systems Unable to obtain from patient on arrival  Allergies  Bee venom and Sulfa antibiotics  Home Medications   Current Outpatient Rx  Name  Route  Sig  Dispense  Refill  . ARIPiprazole (ABILIFY) 2 MG tablet   Oral   Take 2 mg by mouth daily.         .  citalopram (CELEXA) 40 MG tablet   Oral   Take 40 mg by mouth daily.         . methadone (DOLOPHINE) 10 MG/ML solution   Oral   Take 120 mg by mouth daily.          Marland Kitchen thiothixene (NAVANE) 2 MG capsule   Oral   Take 4 mg by mouth daily.         . nicotine (NICODERM CQ - DOSED IN MG/24 HOURS) 21 mg/24hr patch   Transdermal   Place 1 patch onto the skin daily.   28 patch   0    BP 123/83  Pulse 109  Resp 24  SpO2 95% Physical Exam Gen: well developed and well nourished appearing, patient is severe distress, diaphoretic Head: NCAT Eyes: PERL, EOMI, conjunctiva injected.  Nose: no epistaixis or rhinorrhea Mouth/throat: mucosa is moist and pink, no tongue or lip swelling Neck: supple, no stridor Lungs: RR 40/min with severely restricted air movement - essentially silent chest.  CV: rapid and regular, no murmur Abd: soft, notender, nondistended Back: no ttp, no cva ttp Skin: no rash, extremities mottled and cool Neuro: CN ii-xii grossly intact, no focal deficits Psyche; extremely anxious affect  ED Course  Procedures (including critical care time)  Results for orders placed during the hospital  encounter of 05/16/13 (from the past 24 hour(s))  CBC     Status: Abnormal   Collection Time    05/16/13  2:49 AM      Result Value Range   WBC 9.5  4.0 - 10.5 K/uL   RBC 3.82 (*) 3.87 - 5.11 MIL/uL   Hemoglobin 12.7  12.0 - 15.0 g/dL   HCT 81.1 (*) 91.4 - 78.2 %   MCV 94.0  78.0 - 100.0 fL   MCH 33.2  26.0 - 34.0 pg   MCHC 35.4  30.0 - 36.0 g/dL   RDW 95.6  21.3 - 08.6 %   Platelets 155  150 - 400 K/uL  BASIC METABOLIC PANEL     Status: Abnormal   Collection Time    05/16/13  2:49 AM      Result Value Range   Sodium 137  135 - 145 mEq/L   Potassium 3.1 (*) 3.5 - 5.1 mEq/L   Chloride 100  96 - 112 mEq/L   CO2 19  19 - 32 mEq/L   Glucose, Bld 156 (*) 70 - 99 mg/dL   BUN 11  6 - 23 mg/dL   Creatinine, Ser 5.78  0.50 - 1.10 mg/dL   Calcium 8.4  8.4 - 46.9 mg/dL    GFR calc non Af Amer >90  >90 mL/min   GFR calc Af Amer >90  >90 mL/min  POCT I-STAT 3, BLOOD GAS (G3P V)     Status: Abnormal   Collection Time    05/16/13  3:03 AM      Result Value Range   pH, Ven 7.326 (*) 7.250 - 7.300   pCO2, Ven 36.8 (*) 45.0 - 50.0 mmHg   pO2, Ven 67.0 (*) 30.0 - 45.0 mmHg   Bicarbonate 19.2 (*) 20.0 - 24.0 mEq/L   TCO2 20  0 - 100 mmol/L   O2 Saturation 92.0     Acid-base deficit 6.0 (*) 0.0 - 2.0 mmol/L   Sample type VENOUS    CG4 I-STAT (LACTIC ACID)     Status: Abnormal   Collection Time    05/16/13  3:04 AM      Result Value Range   Lactic Acid, Venous 6.31 (*) 0.5 - 2.2 mmol/L    Dg Chest Portable 1 View  05/16/2013   *RADIOLOGY REPORT*  Clinical Data: Shortness of breath; inhaled chlorine tablet.  PORTABLE CHEST - 1 VIEW  Comparison: Chest radiograph performed 11/18/2012  Findings: The lungs are well-aerated.  Mild chronic peribronchial thickening is noted.  There is no evidence of focal opacification, pleural effusion or pneumothorax.  The cardiomediastinal silhouette is within normal limits.  No acute osseous abnormalities are seen.  A right-sided metallic nipple piercing is noted.  IMPRESSION: No acute cardiopulmonary process seen.  Mild chronic peribronchial thickening noted.   Original Report Authenticated By: Tonia Ghent, M.D.   1. Acute respiratory failure   2. Chlorine inhalation lung injury   3. Lactic acidosis   4. Shock     MDM  Patient presented with acute respiratory failure and shock. Good response to continuous Albuterol and Atrovent nebs along with solumedrol. Patient was also treated with benadryl 50mg , pepcid 20mg  and solumedrol 125mg  - all iv - before it was clear that this was an inhalation injury rather than an anaphylactic reaction.  Patient fluid resuscitated with NS.   Significant improvement noted. Patient was moving air clearly at one point without wheezing. However, on re-exam, patient has significant wheezing and  diminished  air exchange bilaterally. Treated once again with Albuterol 5mg  neb. Case discussed with Dr. Betti Cruz who has accepted the patient and assigned her to a Med Surg bed.   CRITICAL CARE Performed by: Brandt Loosen   Total critical care time: 64m  Critical care time was exclusive of separately billable procedures and treating other patients.  Critical care was necessary to treat or prevent imminent or life-threatening deterioration.  Critical care was time spent personally by me on the following activities: development of treatment plan with patient and/or surrogate as well as nursing, discussions with consultants, evaluation of patient's response to treatment, examination of patient, obtaining history from patient or surrogate, ordering and performing treatments and interventions, ordering and review of laboratory studies, ordering and review of radiographic studies, pulse oximetry and re-evaluation of patient's condition.   Brandt Loosen, MD 05/16/13 [phone]

## 2013-05-16 NOTE — H&P (Signed)
Patient's PCP: No PCP  Chief Complaint: Shortness of breath  History of Present Illness: Wendy Douglas is a 37 y.o. Caucasian female with history of depression, tobacco abuse, chronic narcotic dependence, and COPD who presents with the above complaints.  Patient at 1 a.m. today was at a pool, there was a can of chlorine tablets, she was curious and decided to smell it.  Shortly thereafter patient had severe episode of shortness of breath as a result she was brought to the emergency department for further evaluation.  In the emergency department she was given Solu-Medrol, epinephrine, and albuterol nebulizer with some improvement however patient was still wheezy and short of breath as a result hospitalist service was asked to the patient for further care and management.  Patient denies any recent fevers, chills, vomiting, chest pain, abdominal pain, headaches or vision changes.  She does complain about being nauseated in the morning.  Has had 2 episodes of diarrhea last week.  Has been complaining of some intermittent wheezing over the last 2 weeks.  Review of Systems: All systems reviewed with the patient and positive as per history of present illness, otherwise all other systems are negative.  Past Medical History  Diagnosis Date  . Depression   . Smoker   . Chronic narcotic dependence   . COPD (chronic obstructive pulmonary disease)    History reviewed. No pertinent past surgical history. Family History  Problem Relation Age of Onset  . Multiple sclerosis Mother   . Other Father     Suicide   History   Social History  . Marital Status: Legally Separated    Spouse Name: N/A    Number of Children: N/A  . Years of Education: N/A   Occupational History  . Not on file.   Social History Main Topics  . Smoking status: Current Some Day Smoker    Types: Cigarettes  . Smokeless tobacco: Not on file     Comment: 1-2 puffs from time to time  . Alcohol Use: No  . Drug Use: No  .  Sexually Active: Not on file   Other Topics Concern  . Not on file   Social History Narrative  . No narrative on file   Allergies: Bee venom and Sulfa antibiotics  Home Meds: Prior to Admission medications   Medication Sig Start Date End Date Taking? Authorizing Provider  ARIPiprazole (ABILIFY) 2 MG tablet Take 2 mg by mouth daily.   Yes Historical Provider, MD  citalopram (CELEXA) 40 MG tablet Take 40 mg by mouth daily.   Yes Historical Provider, MD  methadone (DOLOPHINE) 10 MG/ML solution Take 120 mg by mouth daily.    Yes Historical Provider, MD  thiothixene (NAVANE) 2 MG capsule Take 4 mg by mouth daily.   Yes Historical Provider, MD  nicotine (NICODERM CQ - DOSED IN MG/24 HOURS) 21 mg/24hr patch Place 1 patch onto the skin daily. 11/22/12   Ripudeep Jenna Luo, MD    Physical Exam: Blood pressure 123/83, pulse 109, resp. rate 24, SpO2 95.00%. General: Awake, Oriented x3, No acute distress. HEENT: EOMI, Moist mucous membranes Neck: Supple CV: S1 and S2 Lungs: Inspiratory and expiratory wheezing, good air movement bilaterally. Abdomen: Soft, Nontender, Nondistended, +bowel sounds. Ext: Good pulses. Trace edema. No clubbing or cyanosis noted. Neuro: Cranial Nerves II-XII grossly intact. Has 5/5 motor strength in upper and lower extremities.  Lab results:  Recent Labs  05/16/13 0249  NA 137  K 3.1*  CL 100  CO2 19  GLUCOSE  156*  BUN 11  CREATININE 0.76  CALCIUM 8.4   No results found for this basename: AST, ALT, ALKPHOS, BILITOT, PROT, ALBUMIN,  in the last 72 hours No results found for this basename: LIPASE, AMYLASE,  in the last 72 hours  Recent Labs  05/16/13 0249  WBC 9.5  HGB 12.7  HCT 35.9*  MCV 94.0  PLT 155   No results found for this basename: CKTOTAL, CKMB, CKMBINDEX, TROPONINI,  in the last 72 hours No components found with this basename: POCBNP,  No results found for this basename: DDIMER,  in the last 72 hours No results found for this basename:  HGBA1C,  in the last 72 hours No results found for this basename: CHOL, HDL, LDLCALC, TRIG, CHOLHDL, LDLDIRECT,  in the last 72 hours No results found for this basename: TSH, T4TOTAL, FREET3, T3FREE, THYROIDAB,  in the last 72 hours No results found for this basename: VITAMINB12, FOLATE, FERRITIN, TIBC, IRON, RETICCTPCT,  in the last 72 hours Imaging results:  Dg Chest Portable 1 View  05/16/2013   *RADIOLOGY REPORT*  Clinical Data: Shortness of breath; inhaled chlorine tablet.  PORTABLE CHEST - 1 VIEW  Comparison: Chest radiograph performed 11/18/2012  Findings: The lungs are well-aerated.  Mild chronic peribronchial thickening is noted.  There is no evidence of focal opacification, pleural effusion or pneumothorax.  The cardiomediastinal silhouette is within normal limits.  No acute osseous abnormalities are seen.  A right-sided metallic nipple piercing is noted.  IMPRESSION: No acute cardiopulmonary process seen.  Mild chronic peribronchial thickening noted.   Original Report Authenticated By: Tonia Ghent, M.D.   Other results: EKG: normal EKG, normal sinus rhythm, unchanged from previous tracings, sinus tachycardia.  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  Start prednisone 40 mg daily.  Continue scheduled DuoNeb breathing treatments.  When necessary albuterol breathing treatments in between if needed.  Uptodate recommended treating with short-acting beta agonists such as albuterol with ipratropium.  Recommended prednisone but taper over 10-15 days, which is longer than taper used for typical asthma exacerbations.  Tobacco abuse Patient counseled on cessation.  Motivated on smoking cessation.  Nicotine patch prescribed.  Chronic nicotine dependence Continue home methadone dose.  Depression Continue home Celexa,  Abilify, and thiothixene.  History of COPD Management as indicated above.  Tachycardia Likely due to respiratory distress and due to albuterol neb treatments.  Hypokalemia Replace as needed, likely due to albuterol treatments.  Check magnesium in the morning.  Prophylaxis Lovenox.  CODE STATUS Full code.  Disposition Admit the patient as observation to MedSurg.  Time spent on admission, talking to the patient, and coordinating care was: 50 mins.  Kerriann Kamphuis A, MD 05/16/2013, 4:36 AM

## 2013-05-17 LAB — BASIC METABOLIC PANEL
BUN: 11 mg/dL (ref 6–23)
Calcium: 9.6 mg/dL (ref 8.4–10.5)
GFR calc non Af Amer: >90 mL/min (ref >90)
Glucose, Bld: 101 mg/dL — ABNORMAL HIGH (ref 70–99)

## 2013-05-17 LAB — CBC
HCT: 37 % (ref 36.0–46.0)
Hemoglobin: 12.5 g/dL (ref 12.0–15.0)
MCH: 31.9 pg (ref 26.0–34.0)
MCHC: 33.8 g/dL (ref 30.0–36.0)

## 2013-05-17 MED ORDER — ALBUTEROL SULFATE HFA 108 (90 BASE) MCG/ACT IN AERS: 2.0000 | INHALATION_SPRAY | Freq: Four times a day (QID) | RESPIRATORY_TRACT | Status: DC | PRN

## 2013-05-17 MED ORDER — PREDNISONE 20 MG PO TABS: 40.0000 mg | ORAL_TABLET | Freq: Every day | ORAL | Status: DC

## 2013-05-17 NOTE — Discharge Summary (Addendum)
Physician Discharge Summary  Wendy Douglas JYN:[id number] DOB: [birth year] DOA: 05/16/2013  PCP: Default, Provider, MD  Admit date: 05/16/2013 Discharge date: 05/17/2013  Time spent: 35 minutes  Recommendations for Outpatient Follow-up:  1. Recommend sending primary care physician. 2. Recommend smoking cessation counseling  Discharge Diagnoses:  Principal Problem:   Acute respiratory failure Active Problems:   Chronic narcotic dependence   Smoker   Depression   COPD   Chlorine inhalation lung injury   Hypokalemia   Discharge Condition: Good  Diet recommendation: Regular  Filed Weights   05/17/13 0604  Weight: 65.772 kg (145 lb)    History of present illness:  37 year old female, chronic methadone, likely mild to moderate. COPD witth smoking history 18 years present emergency room 05/16/2013 with severe shortness of breath after being at a park where in she accidentally inhaled chloring. She had significant respiratory distress and was treated initially as she had a allergic reaction given unsure as to what occurred. Ultimately, it was determined that she might have had chlorine inhalation injury and was kept in the hosp for management. She was given around-the-clock nebulizations of albuterol, ipratropium, and Solu-Medrol was transitioned to prednisone She did ultimately very well subsequently and was tapered to a dose of prednisone 40 mg for 2 more days, instructed to get inhaler as albuterol to use regularly, and instructed to stop smoking and take up for nicotine patches, which she is willing to do. She does have a history of chronic methadone maintenance and will followup with her clinic for further management     Discharge Exam: Filed Vitals:   05/16/13 1708 05/16/13 2010 05/16/13 2213 05/17/13 0604  BP: 138/99  127/78 145/98  Pulse: 105  93 86  Temp: 99 F (37.2 C)  98.8 F (37.1 C) 97.9 F (36.6 C)  TempSrc: Oral  Oral Oral  Resp: 18  19 19   Height:    5\' 6"   (1.676 m)  Weight:    65.772 kg (145 lb)  SpO2: 100% 99% 97% 100%    alert and oriented no apparent distress  General: Alert  Cardiovascular:  S1-S2 no murmur, rub, or gallop Respiratory: Clinically clear. No added sound   Discharge Instructions     Medication List         albuterol 108 (90 BASE) MCG/ACT inhaler  Commonly known as:  PROVENTIL HFA;VENTOLIN HFA  Inhale 2 puffs into the lungs every 6 (six) hours as needed for wheezing.     ARIPiprazole 2 MG tablet  Commonly known as:  ABILIFY  Take 2 mg by mouth daily.     citalopram 40 MG tablet  Commonly known as:  CELEXA  Take 40 mg by mouth daily.     methadone 10 MG/ML solution  Commonly known as:  DOLOPHINE  Take 120 mg by mouth daily.     nicotine 21 mg/24hr patch  Commonly known as:  NICODERM CQ - dosed in mg/24 hours  Place 1 patch onto the skin daily.     predniSONE 20 MG tablet  Commonly known as:  DELTASONE  Take 2 tablets (40 mg total) by mouth daily with breakfast.     thiothixene 2 MG capsule  Commonly known as:  NAVANE  Take 4 mg by mouth daily.       Allergies  Allergen Reactions  . Bee Venom Shortness Of Breath and Swelling  . Sulfa Antibiotics Rash      The results of significant diagnostics from this hospitalization (including imaging, microbiology, ancillary and  laboratory) are listed below for reference.    Significant Diagnostic Studies: Dg Chest Portable 1 View  05/16/2013   *RADIOLOGY REPORT*  Clinical Data: Shortness of breath; inhaled chlorine tablet.  PORTABLE CHEST - 1 VIEW  Comparison: Chest radiograph performed 11/18/2012  Findings: The lungs are well-aerated.  Mild chronic peribronchial thickening is noted.  There is no evidence of focal opacification, pleural effusion or pneumothorax.  The cardiomediastinal silhouette is within normal limits.  No acute osseous abnormalities are seen.  A right-sided metallic nipple piercing is noted.  IMPRESSION: No acute cardiopulmonary process  seen.  Mild chronic peribronchial thickening noted.   Original Report Authenticated By: Tonia Ghent, M.D.    Microbiology: No results found for this or any previous visit (from the past 240 hour(s)).   Labs: Basic Metabolic Panel:  Recent Labs Lab 05/16/13 0249 05/16/13 0806 05/17/13 0555  NA 137 137 134*  K 3.1* 3.3* 4.3  CL 100 101 98  CO2 19 16* 22  GLUCOSE 156* 163* 101*  BUN 11 11 11   CREATININE 0.76 0.67 0.69  CALCIUM 8.4 8.3* 9.6  MG  --  1.6  --    Liver Function Tests: No results found for this basename: AST, ALT, ALKPHOS, BILITOT, PROT, ALBUMIN,  in the last 168 hours No results found for this basename: LIPASE, AMYLASE,  in the last 168 hours No results found for this basename: AMMONIA,  in the last 168 hours CBC:  Recent Labs Lab 05/16/13 0249 05/16/13 0806 05/17/13 0555  WBC 9.5 8.5 11.5*  HGB 12.7 11.9* 12.5  HCT 35.9* 33.8* 37.0  MCV 94.0 93.6 94.4  PLT 155 130* 141*   Cardiac Enzymes: No results found for this basename: CKTOTAL, CKMB, CKMBINDEX, TROPONINI,  in the last 168 hours BNP: BNP (last 3 results) No results found for this basename: PROBNP,  in the last 8760 hours CBG: No results found for this basename: GLUCAP,  in the last 168 hours     Signed:  Rhetta Mura  [facility] 05/17/2013, 8:21 AM

## 2013-09-08 ENCOUNTER — Other Ambulatory Visit: Payer: Self-pay

## 2013-10-14 ENCOUNTER — Encounter (HOSPITAL_COMMUNITY): Payer: Self-pay | Admitting: Emergency Medicine

## 2013-10-14 ENCOUNTER — Emergency Department (HOSPITAL_COMMUNITY): Payer: Self-pay

## 2013-10-14 ENCOUNTER — Inpatient Hospital Stay (HOSPITAL_COMMUNITY)
Admission: EM | Admit: 2013-10-14 | Discharge: 2013-10-19 | DRG: 193 | Disposition: A | Discharge status: Home / Self Care | Payer: Self-pay | Attending: Internal Medicine | Admitting: Internal Medicine

## 2013-10-14 DIAGNOSIS — F1120 Opioid dependence, uncomplicated: Secondary | ICD-10-CM

## 2013-10-14 DIAGNOSIS — F32A Depression, unspecified: Secondary | ICD-10-CM

## 2013-10-14 DIAGNOSIS — R0902 Hypoxemia: Secondary | ICD-10-CM

## 2013-10-14 DIAGNOSIS — J9600 Acute respiratory failure, unspecified whether with hypoxia or hypercapnia: Secondary | ICD-10-CM

## 2013-10-14 DIAGNOSIS — F329 Major depressive disorder, single episode, unspecified: Secondary | ICD-10-CM | POA: Diagnosis present

## 2013-10-14 DIAGNOSIS — Z23 Encounter for immunization: Secondary | ICD-10-CM

## 2013-10-14 DIAGNOSIS — J708 Respiratory conditions due to other specified external agents: Secondary | ICD-10-CM

## 2013-10-14 DIAGNOSIS — Z79899 Other long term (current) drug therapy: Secondary | ICD-10-CM

## 2013-10-14 DIAGNOSIS — E860 Dehydration: Secondary | ICD-10-CM

## 2013-10-14 DIAGNOSIS — E871 Hypo-osmolality and hyponatremia: Secondary | ICD-10-CM

## 2013-10-14 DIAGNOSIS — F17200 Nicotine dependence, unspecified, uncomplicated: Secondary | ICD-10-CM

## 2013-10-14 DIAGNOSIS — J449 Chronic obstructive pulmonary disease, unspecified: Secondary | ICD-10-CM

## 2013-10-14 DIAGNOSIS — J189 Pneumonia, unspecified organism: Secondary | ICD-10-CM

## 2013-10-14 DIAGNOSIS — F3289 Other specified depressive episodes: Secondary | ICD-10-CM | POA: Diagnosis present

## 2013-10-14 DIAGNOSIS — J129 Viral pneumonia, unspecified: Principal | ICD-10-CM | POA: Diagnosis present

## 2013-10-14 DIAGNOSIS — J9601 Acute respiratory failure with hypoxia: Secondary | ICD-10-CM

## 2013-10-14 DIAGNOSIS — J101 Influenza due to other identified influenza virus with other respiratory manifestations: Secondary | ICD-10-CM

## 2013-10-14 DIAGNOSIS — E876 Hypokalemia: Secondary | ICD-10-CM

## 2013-10-14 DIAGNOSIS — D72829 Elevated white blood cell count, unspecified: Secondary | ICD-10-CM

## 2013-10-14 DIAGNOSIS — F1920 Other psychoactive substance dependence, uncomplicated: Secondary | ICD-10-CM | POA: Diagnosis present

## 2013-10-14 DIAGNOSIS — R6510 Systemic inflammatory response syndrome (SIRS) of non-infectious origin without acute organ dysfunction: Secondary | ICD-10-CM

## 2013-10-14 LAB — CBC
HCT: 31.8 % — ABNORMAL LOW (ref 36.0–46.0)
Hemoglobin: 10.8 g/dL — ABNORMAL LOW (ref 12.0–15.0)
MCH: 31.8 pg (ref 26.0–34.0)
MCV: 93.5 fL (ref 78.0–100.0)
RDW: 14.4 % (ref 11.5–15.5)
WBC: 10.3 10*3/uL (ref 4.0–10.5)

## 2013-10-14 LAB — CBC WITH DIFFERENTIAL/PLATELET
Basophils Relative: 0 % (ref 0–1)
Eosinophils Absolute: 0.1 10*3/uL (ref 0.0–0.7)
MCH: 31.4 pg (ref 26.0–34.0)
MCHC: 34 g/dL (ref 30.0–36.0)
Monocytes Relative: 4 % (ref 3–12)
Neutrophils Relative %: 77 % (ref 43–77)
Platelets: 215 10*3/uL (ref 150–400)

## 2013-10-14 LAB — CREATININE, SERUM: GFR calc Af Amer: >90 mL/min (ref >90)

## 2013-10-14 LAB — COMPREHENSIVE METABOLIC PANEL
Albumin: 3.1 g/dL — ABNORMAL LOW (ref 3.5–5.2)
Alkaline Phosphatase: 104 U/L (ref 39–117)
BUN: 3 mg/dL — ABNORMAL LOW (ref 6–23)
Potassium: 5 mEq/L (ref 3.5–5.1)
Total Protein: 7.7 g/dL (ref 6.0–8.3)

## 2013-10-14 MED ORDER — DEXTROSE 5 % IV SOLN
1.0000 g | Freq: Once | INTRAVENOUS | Status: AC
  Administered 2013-10-14: 1 g via INTRAVENOUS
  Filled 2013-10-14: qty 10

## 2013-10-14 MED ORDER — HEPARIN SODIUM (PORCINE) 5000 UNIT/ML IJ SOLN
5000.0000 [IU] | Freq: Three times a day (TID) | INTRAMUSCULAR | Status: DC
  Administered 2013-10-14 – 2013-10-19 (×14): 5000 [IU] via SUBCUTANEOUS
  Filled 2013-10-14 (×18): qty 1

## 2013-10-14 MED ORDER — DEXTROSE 5 % IV SOLN: 100.0000 mg/kg | Freq: Once | INTRAVENOUS | Status: DC

## 2013-10-14 MED ORDER — ADULT MULTIVITAMIN W/MINERALS CH
1.0000 | ORAL_TABLET | Freq: Every day | ORAL | Status: DC
  Administered 2013-10-14 – 2013-10-19 (×6): 1 via ORAL
  Filled 2013-10-14 (×6): qty 1

## 2013-10-14 MED ORDER — HYDROXYZINE HCL 25 MG PO TABS
25.0000 mg | ORAL_TABLET | Freq: Three times a day (TID) | ORAL | Status: DC | PRN
  Administered 2013-10-14 – 2013-10-15 (×2): 25 mg via ORAL
  Filled 2013-10-14 (×3): qty 1

## 2013-10-14 MED ORDER — GABAPENTIN 400 MG PO CAPS
400.0000 mg | ORAL_CAPSULE | Freq: Four times a day (QID) | ORAL | Status: DC
  Administered 2013-10-14 – 2013-10-19 (×19): 400 mg via ORAL
  Filled 2013-10-14 (×22): qty 1

## 2013-10-14 MED ORDER — IPRATROPIUM BROMIDE 0.02 % IN SOLN
0.5000 mg | Freq: Once | RESPIRATORY_TRACT | Status: AC
  Administered 2013-10-14: 0.5 mg via RESPIRATORY_TRACT
  Filled 2013-10-14: qty 2.5

## 2013-10-14 MED ORDER — ALBUTEROL SULFATE (5 MG/ML) 0.5% IN NEBU
2.5000 mg | INHALATION_SOLUTION | Freq: Four times a day (QID) | RESPIRATORY_TRACT | Status: DC
  Administered 2013-10-14 – 2013-10-16 (×8): 2.5 mg via RESPIRATORY_TRACT
  Filled 2013-10-14 (×8): qty 0.5

## 2013-10-14 MED ORDER — INFLUENZA VAC SPLIT QUAD 0.5 ML IM SUSP
0.5000 mL | INTRAMUSCULAR | Status: AC
  Administered 2013-10-15: 11:00:00 0.5 mL via INTRAMUSCULAR
  Filled 2013-10-14: qty 0.5

## 2013-10-14 MED ORDER — ALBUTEROL (5 MG/ML) CONTINUOUS INHALATION SOLN
10.0000 mg/h | INHALATION_SOLUTION | Freq: Once | RESPIRATORY_TRACT | Status: AC
  Administered 2013-10-14: 10 mg/h via RESPIRATORY_TRACT
  Filled 2013-10-14: qty 20

## 2013-10-14 MED ORDER — METHADONE HCL 10 MG PO TABS: 120.0000 mg | ORAL_TABLET | Freq: Every day | ORAL | Status: DC

## 2013-10-14 MED ORDER — CITALOPRAM HYDROBROMIDE 40 MG PO TABS
40.0000 mg | ORAL_TABLET | Freq: Every day | ORAL | Status: DC
  Administered 2013-10-14 – 2013-10-19 (×6): 40 mg via ORAL
  Filled 2013-10-14 (×6): qty 1

## 2013-10-14 MED ORDER — PREDNISONE 20 MG PO TABS
60.0000 mg | ORAL_TABLET | Freq: Once | ORAL | Status: AC
  Administered 2013-10-14: 60 mg via ORAL
  Filled 2013-10-14: qty 3

## 2013-10-14 MED ORDER — ALBUTEROL SULFATE (5 MG/ML) 0.5% IN NEBU
5.0000 mg | INHALATION_SOLUTION | Freq: Once | RESPIRATORY_TRACT | Status: AC
  Administered 2013-10-14: 5 mg via RESPIRATORY_TRACT
  Filled 2013-10-14: qty 1

## 2013-10-14 MED ORDER — SODIUM CHLORIDE 0.9 % IV SOLN
INTRAVENOUS | Status: DC
  Administered 2013-10-14: 18:00:00 via INTRAVENOUS
  Administered 2013-10-15: 75 mL/h via INTRAVENOUS

## 2013-10-14 MED ORDER — ARIPIPRAZOLE 2 MG PO TABS
2.0000 mg | ORAL_TABLET | Freq: Every day | ORAL | Status: DC
  Administered 2013-10-14 – 2013-10-19 (×6): 2 mg via ORAL
  Filled 2013-10-14 (×6): qty 1

## 2013-10-14 MED ORDER — HYDROXYZINE PAMOATE 25 MG PO CAPS
25.0000 mg | ORAL_CAPSULE | Freq: Four times a day (QID) | ORAL | Status: DC | PRN
  Filled 2013-10-14: qty 1

## 2013-10-14 MED ORDER — DEXTROSE 5 % IV SOLN
500.0000 mg | INTRAVENOUS | Status: DC
  Administered 2013-10-14: 500 mg via INTRAVENOUS

## 2013-10-14 MED ORDER — METHADONE HCL 10 MG/ML PO CONC: 120.0000 mg | Freq: Every day | ORAL | Status: DC

## 2013-10-14 MED ORDER — LEVOFLOXACIN IN D5W 750 MG/150ML IV SOLN
750.0000 mg | INTRAVENOUS | Status: DC
  Administered 2013-10-14 – 2013-10-17 (×4): 750 mg via INTRAVENOUS
  Filled 2013-10-14 (×5): qty 150

## 2013-10-14 MED ORDER — GUAIFENESIN-DM 100-10 MG/5ML PO SYRP
5.0000 mL | ORAL_SOLUTION | ORAL | Status: DC | PRN
  Administered 2013-10-14 – 2013-10-17 (×8): 5 mL via ORAL
  Filled 2013-10-14 (×8): qty 5

## 2013-10-14 MED ORDER — GUAIFENESIN ER 600 MG PO TB12
1200.0000 mg | ORAL_TABLET | Freq: Two times a day (BID) | ORAL | Status: DC
  Administered 2013-10-14 – 2013-10-19 (×10): 1200 mg via ORAL
  Filled 2013-10-14 (×12): qty 2

## 2013-10-14 NOTE — ED Provider Notes (Signed)
I have personally seen and examined the patient.  I have discussed the plan of care with the resident.  I have reviewed the documentation on PMH/FH/Soc. History.  I have reviewed the documentation of the resident and agree.  I have reviewed and agree with the ECG interpretation(s) documented by the resident.  I advised admission due to hypoxia, wheeze and possibility of deterioration D/w triad dr Arthor Captain, will admit  Joya Gaskins, MD 10/14/13 [phone]

## 2013-10-14 NOTE — ED Notes (Signed)
Pt c/o cough and URI sx starting yesterday with chest tightness and SOB

## 2013-10-14 NOTE — Progress Notes (Signed)
NURSING PROGRESS NOTE  Wendy Douglas [id number] Admission Data: 10/14/2013 5:35 PM Attending Provider: Clydia Llano, MD WUJ:[id number], Provider, MD Code Status: full   Wendy Douglas is a 37 y.o. female patient admitted from ED:  -No acute distress noted.  -No complaints of shortness of breath.  -No complaints of chest pain.   Cardiac Monitoring: Box # [id number] in place. Cardiac monitor yields:normal sinus rhythm.  Blood pressure 132/77, pulse 99, temperature 98.8 F (37.1 C), temperature source Oral, resp. rate 18, height 5\' 6"  (1.676 m), weight 74.98 kg (165 lb 4.8 oz), last menstrual period 09/15/2013, SpO2 95.00%.   IV Fluids:  IV in place, occlusive dsg intact without redness, IV cath antecubital right, condition patent and no redness none.   Allergies:  Bee venom and Sulfa antibiotics  Past Medical History:   has a past medical history of Depression; Smoker; Chronic narcotic dependence; COPD (chronic obstructive pulmonary disease); Shortness of breath; and Asthma.  Past Surgical History:   has no past surgical history on file.  Social History:   reports that she quit smoking about 6 months ago. Her smoking use included Cigarettes. She smoked 0.00 packs per day. She has never used smokeless tobacco. She reports that she drinks about 1.8 ounces of alcohol per week. She reports that she does not use illicit drugs.  Skin: intact  Patient/Family orientated to room. Information packet given to patient/family. Admission inpatient armband information verified with patient/family to include name and date of birth and placed on patient arm. Side rails up x 2, fall assessment and education completed with patient/family. Patient/family able to verbalize understanding of risk associated with falls and verbalized understanding to call for assistance before getting out of bed. Call light within reach. Patient/family able to voice and demonstrate understanding of unit orientation instructions.     Will continue to evaluate and treat per MD orders.   Madelin Rear, MSN, RN, [employer]

## 2013-10-14 NOTE — ED Notes (Signed)
MD at bedside. 

## 2013-10-14 NOTE — ED Provider Notes (Signed)
Patient seen/examined in the Emergency Department in conjunction with Resident Physician Provider Jones Patient reports cough and SOB Exam : awake/alert, wheezing noted bilaterally Plan: continue nebs and reassess  I have reviewed and agree with the ECG interpretation(s) documented by the resident.    Joya Gaskins, MD 10/14/13 1320

## 2013-10-14 NOTE — H&P (Signed)
[facility] History and Physical  Kinsley Holderman ZOX:[id number] DOB: 02-Jun-1976 DOA: 10/14/2013  Referring physician: Bebe Shaggy PCP: Default, Provider, MD   Chief Complaint: Shortness of breath  HPI: Tessla Spurling is a 37 y.o. female with past medical history of depression, narcotic dependence and history of influenza pneumonia in January of 2014. Patient came to the hospital because of shortness of breath. Patient reported that she was in her usual state of health till yesterday when she started to have low-grade fever, shortness of breath and dry cough. Her symptoms worsened today and prompted her to come to the hospital seeking for medical advise. In the ED she does have low-grade fever of 99.3, oxygen saturation is 88% on room air. Chest x-ray showed widespread interstitial infiltrates, differential diagnoses include pneumonia (atypical), pulmonary edema, other consideration including allergic pneumonitis or collagenous diseases.  Review of Systems:  Constitutional: Low-grade fever and generalized weakness Eyes: negative for irritation, redness and visual disturbance Ears, nose, mouth, throat, and face: negative for earaches, epistaxis, nasal congestion and sore throat Respiratory: SOB and dry cough Cardiovascular: negative for chest pain, dyspnea, lower extremity edema, orthopnea, palpitations and syncope Gastrointestinal: negative for abdominal pain, constipation, diarrhea, melena, nausea and vomiting Genitourinary:negative for dysuria, frequency and hematuria Hematologic/lymphatic: negative for bleeding, easy bruising and lymphadenopathy Musculoskeletal:negative for arthralgias, muscle weakness and stiff joints Neurological: negative for coordination problems, gait problems, headaches and weakness Endocrine: negative for diabetic symptoms including polydipsia, polyuria and weight loss Allergic/Immunologic: negative for anaphylaxis, hay fever and urticaria  Past Medical History   Diagnosis Date  . Depression   . Smoker   . Chronic narcotic dependence   . COPD (chronic obstructive pulmonary disease)   . Shortness of breath   . Asthma     IN CHILDHOOD   History reviewed. No pertinent past surgical history. Social History:  reports that she quit smoking about 6 months ago. Her smoking use included Cigarettes. She smoked 0.00 packs per day. She has never used smokeless tobacco. She reports that she drinks about 1.8 ounces of alcohol per week. She reports that she does not use illicit drugs.  Allergies  Allergen Reactions  . Bee Venom Shortness Of Breath and Swelling  . Sulfa Antibiotics Rash    Family History  Problem Relation Age of Onset  . Multiple sclerosis Mother   . Other Father     Suicide     Prior to Admission medications   Medication Sig Start Date End Date Taking? Authorizing Provider  albuterol (PROVENTIL HFA;VENTOLIN HFA) 108 (90 BASE) MCG/ACT inhaler Inhale 2 puffs into the lungs every 6 (six) hours as needed for wheezing. 05/17/13  Yes Rhetta Mura, MD  ARIPiprazole (ABILIFY) 2 MG tablet Take 2 mg by mouth daily.   Yes Historical Provider, MD  citalopram (CELEXA) 40 MG tablet Take 40 mg by mouth daily.   Yes Historical Provider, MD  gabapentin (NEURONTIN) 400 MG capsule Take 400 mg by mouth 4 (four) times daily.   Yes Historical Provider, MD  hydrOXYzine (VISTARIL) 25 MG capsule Take 25 mg by mouth every 6 (six) hours as needed for anxiety.   Yes Historical Provider, MD  methadone (DOLOPHINE) 10 MG/ML solution Take 120 mg by mouth daily.    Yes Historical Provider, MD  Multiple Vitamin (MULTIVITAMIN WITH MINERALS) TABS tablet Take 1 tablet by mouth daily.   Yes Historical Provider, MD   Physical Exam: BP 113/58  Pulse 104  Temp(Src) 99.3 F (37.4 C) (Oral)  Resp 12  SpO2 96%  LMP 09/15/2013 General appearance: alert, cooperative and no distress  Head: Normocephalic, without obvious abnormality, atraumatic  Eyes:  conjunctivae/corneas clear. PERRL, EOM's intact. Fundi benign.  Nose: Nares normal. Septum midline. Mucosa normal. No drainage or sinus tenderness.  Throat: lips, mucosa, and tongue normal; teeth and gums normal  Neck: Supple, no masses, no cervical lymphadenopathy, no JVD appreciated, no meningeal signs Resp: clear to auscultation bilaterally  Chest wall: no tenderness  Cardio: regular rate and rhythm, S1, S2 normal, no murmur, click, rub or gallop  GI: soft, non-tender; bowel sounds normal; no masses, no organomegaly  Extremities: extremities normal, atraumatic, no cyanosis or edema  Skin: Skin color, texture, turgor normal. No rashes or lesions  Neurologic: Alert and oriented X 3, normal strength and tone. Normal symmetric reflexes. Normal coordination and gait  Labs on Admission:  Basic Metabolic Panel:  Recent Labs Lab 10/14/13 1200  NA 137  K 5.0  CL 102  CO2 24  GLUCOSE 67*  BUN 3*  CREATININE 0.49*  CALCIUM 8.8   Liver Function Tests:  Recent Labs Lab 10/14/13 1200  AST 47*  ALT 12  ALKPHOS 104  BILITOT 0.4  PROT 7.7  ALBUMIN 3.1*   No results found for this basename: LIPASE, AMYLASE,  in the last 168 hours No results found for this basename: AMMONIA,  in the last 168 hours CBC:  Recent Labs Lab 10/14/13 1156  WBC 9.2  NEUTROABS 7.1  HGB 11.2*  HCT 32.9*  MCV 92.2  PLT 215   Cardiac Enzymes: No results found for this basename: CKTOTAL, CKMB, CKMBINDEX, TROPONINI,  in the last 168 hours  BNP (last 3 results) No results found for this basename: PROBNP,  in the last 8760 hours CBG: No results found for this basename: GLUCAP,  in the last 168 hours  Radiological Exams on Admission: Dg Chest 2 View  10/14/2013   CLINICAL DATA:  Shortness of breath and cough  EXAM: CHEST  2 VIEW  COMPARISON:  May 16, 2013  FINDINGS: There is widespread interstitial and patchy alveolar opacity throughout the lungs bilaterally. The heart size and pulmonary vascularity  are normal. No adenopathy. No bone lesions. No pneumothorax.  IMPRESSION: Widespread interstitial and alveolar opacity bilaterally, a finding representing a dramatic change from prior study. Differential considerations include diffuse atypical infection, noncardiogenic edema, or toxic/ allergic type response. Collagen vascular disease is a less common cause for appearance of this nature.  No apparent adenopathy.  Heart size normal.   Electronically Signed   By: Bretta Bang M.D.   On: 10/14/2013 10:40    EKG: Independently reviewed.   Assessment/Plan Principal Problem:   Pneumonia Active Problems:   Chronic narcotic dependence   Smoker   Depression   Pneumonia -With hypoxia with oxygen saturation of 88% on room air. -Patient started on Levaquin 750 mg. -Chest x-ray showing probable atypical infection versus other pathologies mentioned above. -Discussed over the phone with Dr. Vassie Loll of the [facility] service, recommended to treat as atypical pneumonia. -Check influenza PCR, Legionella and Streptococcus antigens. If not improving can get CT of the chest. -Patient did not improve or oxygen saturation increased he recommended to recall [facility] for official consultation.  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 -Patient reports that she takes 135 mg of methadone. Pharmacy reported 120 mg of methadone daily. -Denied any street drug use.  Tobacco abuse -Patient counseled extensively, she used to smoke one pack per day, she is  down to half pack per day. -Requested nicotine patch.  Depression -Patient was very emotional when I entered the room and she was crying. -When I ask her she reported that she is frustrated anticipating the high cost of the admission and medications.  Code Status: Full code Family Communication: Plan discussed with the patient Disposition Plan: Inpatient, telemetry, anticipate length of stay to be greater than 2  midnights  Time spent: 70 minutes  [facility] Pager [phone]

## 2013-10-14 NOTE — ED Provider Notes (Signed)
CSN: [id number]     Arrival date & time 10/14/13  1013 History   First MD Initiated Contact with Patient 10/14/13 1110     Chief Complaint  Patient presents with  . Shortness of Breath  . Cough   (Consider location/radiation/quality/duration/timing/severity/associated sxs/prior Treatment) HPI Wendy Douglas is a 37 y.o. female w/ PMHx of Depression, chronic narcotic dependence (on Methadone), COPD, and Asthma, presents to the ED w/ complaints of dry cough, DOE, and chest tightness since yesterday. The patient says these symptoms began last night. She describes a dull pressure in the center of her chest, worse w/ inspiration. She has been using her albuterol rescue inhaler at home but with minimal relief. She denies any associated fever, chills, sore throat, nausea, vomiting, dizziness, lightheadedness, diarrhea, or sick contacts. The patient has a previous history of some underlying lung disease and smokes 1/2 pack cigarettes a day.   Past Medical History  Diagnosis Date  . Depression   . Smoker   . Chronic narcotic dependence   . COPD (chronic obstructive pulmonary disease)   . Shortness of breath   . Asthma     IN CHILDHOOD   History reviewed. No pertinent past surgical history. Family History  Problem Relation Age of Onset  . Multiple sclerosis Mother   . Other Father     Suicide   History  Substance Use Topics  . Smoking status: Former Smoker    Types: Cigarettes    Quit date: 03/16/2013  . Smokeless tobacco: Never Used     Comment: 1-2 puffs from time to time    uses vapor cigarette  . Alcohol Use: 1.8 oz/week    3 Cans of beer per week     Comment: daily   OB History   Grav Para Term Preterm Abortions TAB SAB Ect Mult Living                 Review of Systems General: Denies fever, chills, diaphoresis, appetite change and fatigue.  HEENT: No abnormalities. Respiratory: Positive for SOB, DOE, chest tightness, cough, and wheezing. Cardiovascular: Denies chest  pain, palpitations and leg swelling.  Gastrointestinal: Denies nausea, vomiting, abdominal pain, diarrhea, constipation, blood in stool and abdominal distention.  Genitourinary: Denies dysuria, urgency, frequency, hematuria, flank pain and difficulty urinating.  Musculoskeletal: Denies myalgias, back pain, joint swelling, arthralgias and gait problem.  Skin: Denies pallor, rash and wounds.  Neurological: Denies dizziness, seizures, syncope, weakness, lightheadedness, numbness and headaches.  Psychiatric/Behavioral: Denies mood changes, confusion, nervousness, sleep disturbance and agitation.  Allergies  Bee venom and Sulfa antibiotics  Home Medications   Current Outpatient Rx  Name  Route  Sig  Dispense  Refill  . albuterol (PROVENTIL HFA;VENTOLIN HFA) 108 (90 BASE) MCG/ACT inhaler   Inhalation   Inhale 2 puffs into the lungs every 6 (six) hours as needed for wheezing.   1 Inhaler   2   . ARIPiprazole (ABILIFY) 2 MG tablet   Oral   Take 2 mg by mouth daily.         . citalopram (CELEXA) 40 MG tablet   Oral   Take 40 mg by mouth daily.         Marland Kitchen gabapentin (NEURONTIN) 400 MG capsule   Oral   Take 400 mg by mouth 4 (four) times daily.         . hydrOXYzine (VISTARIL) 25 MG capsule   Oral   Take 25 mg by mouth every 6 (six) hours as needed for  anxiety.         . methadone (DOLOPHINE) 10 MG/ML solution   Oral   Take 120 mg by mouth daily.          . Multiple Vitamin (MULTIVITAMIN WITH MINERALS) TABS tablet   Oral   Take 1 tablet by mouth daily.          Physical Exam Filed Vitals:   10/14/13 1245 10/14/13 1518 10/14/13 1526 10/14/13 1534  BP: 108/56 96/45    Pulse:  105    Temp:      TempSrc:      Resp:  18    SpO2: 96% 88% 88% 95%  General: Vital signs reviewed.  Patient is a well-developed and well-nourished, in no acute distress and cooperative with exam. Alert and oriented x3.  Head: Normocephalic and atraumatic. Eyes: PERRL, EOMI, conjunctivae  normal, No scleral icterus.  Neck: Supple, trachea midline, normal ROM, No JVD, masses, thyromegaly, or carotid bruit present.  Cardiovascular: RRR, S1 normal, S2 normal, no murmurs, gallops, or rubs. Pulmonary/Chest: Normal respiratory effort, CTAB, expiratory wheeze present in all lung fields.  Abdominal: Soft, non-tender, non-distended, bowel sounds are normal, no masses, organomegaly, or guarding present.  Musculoskeletal: No joint deformities, erythema, or stiffness, ROM full and no nontender. Extremities: No swelling or edema,  pulses symmetric and intact bilaterally. No cyanosis or clubbing. Neurological: A&O x3, Strength is normal and symmetric bilaterally, cranial nerve II-XII are grossly intact, no focal motor deficit, sensory intact to light touch bilaterally.  Skin: Warm, dry and intact. No rashes or erythema. Psychiatric: Normal mood and affect. speech and behavior is normal. Cognition and memory are normal.   ED Course  Procedures (including critical care time) Labs Review Labs Reviewed  CBC WITH DIFFERENTIAL - Abnormal; Notable for the following:    RBC 3.57 (*)    Hemoglobin 11.2 (*)    HCT 32.9 (*)    All other components within normal limits  COMPREHENSIVE METABOLIC PANEL - Abnormal; Notable for the following:    Glucose, Bld 67 (*)    BUN 3 (*)    Creatinine, Ser 0.49 (*)    Albumin 3.1 (*)    AST 47 (*)    All other components within normal limits   Imaging Review Dg Chest 2 View  10/14/2013   CLINICAL DATA:  Shortness of breath and cough  EXAM: CHEST  2 VIEW  COMPARISON:  May 16, 2013  FINDINGS: There is widespread interstitial and patchy alveolar opacity throughout the lungs bilaterally. The heart size and pulmonary vascularity are normal. No adenopathy. No bone lesions. No pneumothorax.  IMPRESSION: Widespread interstitial and alveolar opacity bilaterally, a finding representing a dramatic change from prior study. Differential considerations include diffuse  atypical infection, noncardiogenic edema, or toxic/ allergic type response. Collagen vascular disease is a less common cause for appearance of this nature.  No apparent adenopathy.  Heart size normal.   Electronically Signed   By: Bretta Bang M.D.   On: 10/14/2013 10:40    EKG Interpretation    Date/Time:  Friday October 14 2013 10:17:00 EST Ventricular Rate:  104 PR Interval:  120 QRS Duration: 80 QT Interval:  362 QTC Calculation: 476 R Axis:   84 Text Interpretation:  Sinus tachycardia Nonspecific ST and T wave abnormality Abnormal ECG Confirmed by Bebe Shaggy  MD, DONALD [phone]) on 10/14/2013 12:05:12 PM            MDM   Wendy Douglas is a 37 y.o. female w/  PMHx of Depression, chronic narcotic dependence (on Methadone), COPD, and Asthma, presents to the ED w/ complaints of dry cough, DOE, and chest tightness since yesterday. Patient denies fever, chills, nausea, no recent sick contacts. Cough not productive of sputum. Most likely 2/2 underlying lung disease, may be COPD exacerbation vs atypical pneumonia vs bronchitis. May also be 2/2 flu, however, patient lacks many symptoms suggestive of flu. -CXR shows widespread interstitial and patchy alveolar opacity throughout the lungs bilaterally, a finding representing a dramatic change from prior study. -Prednisone 60 mg po  -Given nebulizer treatment, patient w/ improvement in symptoms. SpO2 still in low 90's. Give continuous albuterol neb. -Start Rocephin + Azithromycin IV -CBC w/ no leukocytosis, Hb of 11.2, slightly below her baseline. CMET wnl, slightly elevated AST to 47.  Patient still unable to maintain SpO2 on room air, consistently b/w 88-95%. Put patient on 2L O2 via [place], d/w hospitalist, will admit for hypoxia/COPD exacerbation.   Courtney Paris, MD 10/14/13 Jerene Bears

## 2013-10-15 DIAGNOSIS — E871 Hypo-osmolality and hyponatremia: Secondary | ICD-10-CM

## 2013-10-15 LAB — BASIC METABOLIC PANEL
BUN: 6 mg/dL (ref 6–23)
CO2: 25 mEq/L (ref 19–32)
Chloride: 104 mEq/L (ref 96–112)
GFR calc Af Amer: >90 mL/min (ref >90)
GFR calc non Af Amer: >90 mL/min (ref >90)
Glucose, Bld: 81 mg/dL (ref 70–99)
Potassium: 4.6 mEq/L (ref 3.5–5.1)

## 2013-10-15 LAB — CBC
HCT: 34 % — ABNORMAL LOW (ref 36.0–46.0)
Hemoglobin: 11.4 g/dL — ABNORMAL LOW (ref 12.0–15.0)
MCH: 31.3 pg (ref 26.0–34.0)
MCHC: 33.5 g/dL (ref 30.0–36.0)
MCV: 93.4 fL (ref 78.0–100.0)

## 2013-10-15 LAB — INFLUENZA PANEL BY PCR (TYPE A & B): Influenza A By PCR: NEGATIVE

## 2013-10-15 MED ORDER — METHADONE HCL 10 MG PO TABS
135.0000 mg | ORAL_TABLET | Freq: Every day | ORAL | Status: DC
  Administered 2013-10-15 – 2013-10-19 (×5): 135 mg via ORAL
  Filled 2013-10-15 (×5): qty 14

## 2013-10-15 MED ORDER — FUROSEMIDE 10 MG/ML IJ SOLN
40.0000 mg | Freq: Three times a day (TID) | INTRAMUSCULAR | Status: DC
  Administered 2013-10-15 – 2013-10-18 (×11): 40 mg via INTRAVENOUS
  Filled 2013-10-15 (×15): qty 4

## 2013-10-15 MED ORDER — NICOTINE 14 MG/24HR TD PT24
14.0000 mg | MEDICATED_PATCH | Freq: Every day | TRANSDERMAL | Status: DC
  Administered 2013-10-15 – 2013-10-19 (×5): 14 mg via TRANSDERMAL
  Filled 2013-10-15 (×5): qty 1

## 2013-10-15 MED ORDER — FUROSEMIDE 10 MG/ML IJ SOLN: 40.0000 mg | Freq: Three times a day (TID) | INTRAMUSCULAR | Status: DC

## 2013-10-15 MED ORDER — ALBUTEROL SULFATE (5 MG/ML) 0.5% IN NEBU
2.5000 mg | INHALATION_SOLUTION | RESPIRATORY_TRACT | Status: DC | PRN
  Administered 2013-10-15 – 2013-10-17 (×2): 2.5 mg via RESPIRATORY_TRACT
  Filled 2013-10-15 (×2): qty 0.5

## 2013-10-15 NOTE — Progress Notes (Signed)
[facility] PROGRESS NOTE  Wendy Douglas WUJ:[id number] DOB: September 10, 1976 DOA: 10/14/2013 PCP: Default, Provider, MD  HPI/Subjective: Feels better, denies fever overnight. Still short of breath.  Assessment/Plan: Principal Problem:   Pneumonia Active Problems:   Chronic narcotic dependence   Smoker   Depression   Pneumonia  -With hypoxia with oxygen saturation of 88% on room air.  -Patient started on Levaquin 750 mg.  -Chest x-ray showing probable atypical infection versus other pathologies mentioned above.  -Discussed over the phone with Dr. Vassie Loll of the [facility] service, recommended to treat as atypical pneumonia.  -Check influenza PCR, Legionella and Streptococcus antigens. If not improving can get CT of the chest.  -Patient did not improve or oxygen saturation increased he recommended to recall [facility] for official consultation.  -Because of CXR finding D/C IV fluids, BNP slightly elevated, start patient on Lasix and repeat chest x-ray in the morning.  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  -Patient reports that she takes 135 mg of methadone.  -I verified [facility] and that patient takes 135 mg of methadone daily.  Tobacco abuse  -Patient counseled extensively, she used to smoke one pack per day, she is down to half pack per day.  -Requested nicotine patch.   Depression  -Continue home medications.  Code Status: Full code Family Communication: Plan discussed with the patient. Disposition Plan: Remains inpatient   Consultants:  None  Procedures:  None  Antibiotics:  Levofloxacin  Objective: Filed Vitals:   10/15/13 0654  BP: 111/66  Pulse: 80  Temp: 98.3 F (36.8 C)  Resp: 18    Intake/Output Summary (Last 24 hours) at 10/15/13 1109 Last data filed at 10/15/13 0900  Gross per 24 hour  Intake    960 ml  Output      0 ml  Net    960 ml   Filed Weights   10/14/13  1730 10/15/13 0739  Weight: 74.98 kg (165 lb 4.8 oz) 70.9 kg (156 lb 4.9 oz)    Exam: General: Alert and awake, oriented x3, not in any acute distress. HEENT: anicteric sclera, pupils reactive to light and accommodation, EOMI CVS: S1-S2 clear, no murmur rubs or gallops Chest: clear to auscultation bilaterally, no wheezing, rales or rhonchi Abdomen: soft nontender, nondistended, normal bowel sounds, no organomegaly Extremities: no cyanosis, clubbing or edema noted bilaterally Neuro: Cranial nerves II-XII intact, no focal neurological deficits  Data Reviewed: Basic Metabolic Panel:  Recent Labs Lab 10/14/13 1200 10/14/13 1846 10/15/13 0500  NA 137  --  139  K 5.0  --  4.6  CL 102  --  104  CO2 24  --  25  GLUCOSE 67*  --  81  BUN 3*  --  6  CREATININE 0.49* 0.54 0.48*  CALCIUM 8.8  --  9.0   Liver Function Tests:  Recent Labs Lab 10/14/13 1200  AST 47*  ALT 12  ALKPHOS 104  BILITOT 0.4  PROT 7.7  ALBUMIN 3.1*   No results found for this basename: LIPASE, AMYLASE,  in the last 168 hours No results found for this basename: AMMONIA,  in the last 168 hours CBC:  Recent Labs Lab 10/14/13 1156 10/14/13 1846 10/15/13 0500  WBC 9.2 10.3 12.2*  NEUTROABS 7.1  --   --   HGB 11.2* 10.8* 11.4*  HCT 32.9* 31.8* 34.0*  MCV 92.2 93.5 93.4  PLT 215 165 183   Cardiac Enzymes:  No results found for this basename: CKTOTAL, CKMB, CKMBINDEX, TROPONINI,  in the last 168 hours BNP (last 3 results)  Recent Labs  10/14/13 1846  PROBNP 334.1*   CBG: No results found for this basename: GLUCAP,  in the last 168 hours  Micro No results found for this or any previous visit (from the past 240 hour(s)).   Studies: Dg Chest 2 View  10/14/2013   CLINICAL DATA:  Shortness of breath and cough  EXAM: CHEST  2 VIEW  COMPARISON:  May 16, 2013  FINDINGS: There is widespread interstitial and patchy alveolar opacity throughout the lungs bilaterally. The heart size and pulmonary  vascularity are normal. No adenopathy. No bone lesions. No pneumothorax.  IMPRESSION: Widespread interstitial and alveolar opacity bilaterally, a finding representing a dramatic change from prior study. Differential considerations include diffuse atypical infection, noncardiogenic edema, or toxic/ allergic type response. Collagen vascular disease is a less common cause for appearance of this nature.  No apparent adenopathy.  Heart size normal.   Electronically Signed   By: Bretta Bang M.D.   On: 10/14/2013 10:40    Scheduled Meds: . albuterol  2.5 mg Nebulization QID  . ARIPiprazole  2 mg Oral Daily  . citalopram  40 mg Oral Daily  . furosemide  40 mg Intravenous Q8H  . gabapentin  400 mg Oral QID  . guaiFENesin  1,200 mg Oral BID  . heparin  5,000 Units Subcutaneous Q8H  . levofloxacin (LEVAQUIN) IV  750 mg Intravenous Q24H  . methadone  135 mg Oral Daily  . multivitamin with minerals  1 tablet Oral Daily  . nicotine  14 mg Transdermal Daily   Continuous Infusions:      Time spent: 35 minutes    [facility] A  [facility] Pager [phone] If 7PM-7AM, please contact night-coverage at [email or web address], password [facility] 10/15/2013, 11:09 AM  LOS: 1 day

## 2013-10-15 NOTE — Progress Notes (Signed)
Trying to obtain urine sample, unable to obtain due to patient accidentally throwing toilet paper in sample or having a bowel movement with sample. Will continue to monitor pt and to try to obtain sample.

## 2013-10-15 NOTE — Progress Notes (Signed)
Pt has cut on R arm that had been previously glued together outpatient. Piece of tape had been placed over cut and glue prior to my shift. When patient attempted to remove tape, glue came off cut. Assessed by RN. No bleeding or pain at the site. Foam dressing applied.

## 2013-10-16 ENCOUNTER — Inpatient Hospital Stay (HOSPITAL_COMMUNITY): Payer: Self-pay

## 2013-10-16 DIAGNOSIS — J449 Chronic obstructive pulmonary disease, unspecified: Secondary | ICD-10-CM

## 2013-10-16 DIAGNOSIS — F17200 Nicotine dependence, unspecified, uncomplicated: Secondary | ICD-10-CM

## 2013-10-16 DIAGNOSIS — J9600 Acute respiratory failure, unspecified whether with hypoxia or hypercapnia: Secondary | ICD-10-CM

## 2013-10-16 DIAGNOSIS — J189 Pneumonia, unspecified organism: Secondary | ICD-10-CM

## 2013-10-16 LAB — EXPECTORATED SPUTUM ASSESSMENT W GRAM STAIN, RFLX TO RESP C

## 2013-10-16 LAB — BASIC METABOLIC PANEL
BUN: 9 mg/dL (ref 6–23)
CO2: 30 mEq/L (ref 19–32)
Chloride: 94 mEq/L — ABNORMAL LOW (ref 96–112)
Creatinine, Ser: 0.65 mg/dL (ref 0.50–1.10)
GFR calc non Af Amer: >90 mL/min (ref >90)
Glucose, Bld: 96 mg/dL (ref 70–99)
Potassium: 4 mEq/L (ref 3.5–5.1)
Sodium: 136 mEq/L (ref 135–145)

## 2013-10-16 LAB — STREP PNEUMONIAE URINARY ANTIGEN: Strep Pneumo Urinary Antigen: NEGATIVE

## 2013-10-16 MED ORDER — ALBUTEROL SULFATE (5 MG/ML) 0.5% IN NEBU
2.5000 mg | INHALATION_SOLUTION | Freq: Three times a day (TID) | RESPIRATORY_TRACT | Status: DC
  Administered 2013-10-17 – 2013-10-19 (×7): 2.5 mg via RESPIRATORY_TRACT
  Filled 2013-10-16 (×7): qty 0.5

## 2013-10-16 MED ORDER — IOHEXOL 350 MG/ML SOLN
80.0000 mL | Freq: Once | INTRAVENOUS | Status: AC | PRN
  Administered 2013-10-16: 16:00:00 60 mL via INTRAVENOUS

## 2013-10-16 MED ORDER — IPRATROPIUM BROMIDE 0.02 % IN SOLN
0.5000 mg | Freq: Three times a day (TID) | RESPIRATORY_TRACT | Status: DC
  Administered 2013-10-17 – 2013-10-19 (×7): 0.5 mg via RESPIRATORY_TRACT
  Filled 2013-10-16 (×7): qty 2.5

## 2013-10-16 MED ORDER — OSELTAMIVIR PHOSPHATE 75 MG PO CAPS
75.0000 mg | ORAL_CAPSULE | Freq: Two times a day (BID) | ORAL | Status: DC
  Administered 2013-10-16 – 2013-10-18 (×5): 75 mg via ORAL
  Filled 2013-10-16 (×7): qty 1

## 2013-10-16 MED ORDER — HYDROXYZINE HCL 25 MG PO TABS
25.0000 mg | ORAL_TABLET | Freq: Three times a day (TID) | ORAL | Status: DC | PRN
  Administered 2013-10-16 – 2013-10-18 (×3): 25 mg via ORAL
  Filled 2013-10-16 (×2): qty 1

## 2013-10-16 MED ORDER — IPRATROPIUM BROMIDE 0.02 % IN SOLN: 0.5000 mg | Freq: Three times a day (TID) | RESPIRATORY_TRACT | Status: DC

## 2013-10-16 NOTE — Progress Notes (Signed)
[facility] PROGRESS NOTE  Wendy Douglas JYN:[id number] DOB: 12/12/1975 DOA: 10/14/2013 PCP: Default, Provider, MD  HPI/Subjective: Still hypoxic on room air, oxygen sats went down to 81% even with ambulation, pulmonology to evaluate. CT angio for better evaluation of lung parenchyma as well as rule out PE.  Assessment/Plan: Principal Problem:   Pneumonia Active Problems:   Chronic narcotic dependence   Smoker   Depression   Pneumonia  -With hypoxia with oxygen saturation of 88% on room air.  -Patient started on Levaquin 750 mg.  -Chest x-ray showing probable atypical infection versus other pathologies mentioned above.  -Discussed over the phone with Dr. Vassie Loll of the [facility] service, recommended to treat as atypical pneumonia.  -Check influenza PCR, Legionella and Streptococcus antigens. If not improving can get CT of the chest.  -Patient did not improve or oxygen saturation increased he recommended to recall [facility] for official consultation.  -Started on IV Lasix, chest x-ray from this morning no changes.  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  -Patient reports that she takes 135 mg of methadone.  -[facility] and that patient takes 135 mg of methadone daily.  Tobacco abuse  -Patient counseled extensively, she used to smoke one pack per day, she is down to half pack per day.  -Requested nicotine patch.   Depression  -Continue home medications.  Code Status: Full code Family Communication: Plan discussed with the patient. Disposition Plan: Remains inpatient   Consultants:  None  Procedures:  None  Antibiotics:  Levofloxacin  Objective: Filed Vitals:   10/16/13 0530  BP: 103/66  Pulse: 73  Temp: 98 F (36.7 C)  Resp: 18    Intake/Output Summary (Last 24 hours) at 10/16/13 1114 Last data filed at 10/16/13 8657  Gross per 24 hour  Intake    600 ml  Output    2400 ml  Net  -1800 ml   Filed Weights   10/14/13 1730 10/15/13 0739  Weight: 74.98 kg (165 lb 4.8 oz) 70.9 kg (156 lb 4.9 oz)    Exam: General: Alert and awake, oriented x3, not in any acute distress. HEENT: anicteric sclera, pupils reactive to light and accommodation, EOMI CVS: S1-S2 clear, no murmur rubs or gallops Chest: clear to auscultation bilaterally, no wheezing, rales or rhonchi Abdomen: soft nontender, nondistended, normal bowel sounds, no organomegaly Extremities: no cyanosis, clubbing or edema noted bilaterally Neuro: Cranial nerves II-XII intact, no focal neurological deficits  Data Reviewed: Basic Metabolic Panel:  Recent Labs Lab 10/14/13 1200 10/14/13 1846 10/15/13 0500 10/16/13 1005  NA 137  --  139 136  K 5.0  --  4.6 4.0  CL 102  --  104 94*  CO2 24  --  25 30  GLUCOSE 67*  --  81 96  BUN 3*  --  6 9  CREATININE 0.49* 0.54 0.48* 0.65  CALCIUM 8.8  --  9.0 9.6   Liver Function Tests:  Recent Labs Lab 10/14/13 1200  AST 47*  ALT 12  ALKPHOS 104  BILITOT 0.4  PROT 7.7  ALBUMIN 3.1*   No results found for this basename: LIPASE, AMYLASE,  in the last 168 hours No results found for this basename: AMMONIA,  in the last 168 hours CBC:  Recent Labs Lab 10/14/13 1156 10/14/13 1846 10/15/13 0500  WBC 9.2 10.3 12.2*  NEUTROABS 7.1  --   --   HGB 11.2* 10.8* 11.4*  HCT  32.9* 31.8* 34.0*  MCV 92.2 93.5 93.4  PLT 215 165 183   Cardiac Enzymes: No results found for this basename: CKTOTAL, CKMB, CKMBINDEX, TROPONINI,  in the last 168 hours BNP (last 3 results)  Recent Labs  10/14/13 1846  PROBNP 334.1*   CBG: No results found for this basename: GLUCAP,  in the last 168 hours  Micro Recent Results (from the past 240 hour(s))  CULTURE, EXPECTORATED SPUTUM-ASSESSMENT     Status: None   Collection Time    10/16/13  7:01 AM      Result Value Range Status   Specimen Description SPUTUM   Final   Special Requests NONE   Final   Sputum  evaluation     Final   Value: MICROSCOPIC FINDINGS SUGGEST THAT THIS SPECIMEN IS NOT REPRESENTATIVE OF LOWER RESPIRATORY SECRETIONS. PLEASE RECOLLECT.     CALLED TO WESSELINK,JESSE RN 10/16/13 0945 WOOTEN,K   Report Status 10/16/2013 FINAL   Final     Studies: Dg Chest 2 View  10/16/2013   CLINICAL DATA:  Cough, shortness of breath, chest congestion diffuse findings on previous chest radiograph  EXAM: CHEST  2 VIEW  COMPARISON:  10/14/2013  FINDINGS: Diffuse interstitial and airspace infiltrates are once again appreciated unchanged from prior study. No new focal regions of consolidation are appreciated. Cardiac silhouette and osseous structures are unremarkable.  IMPRESSION: Persistent bilateral mixed interstitial and airspace infiltrates. Saint differential considerations as described previously. Continued surveillance evaluation recommended.   Electronically Signed   By: Salome Holmes M.D.   On: 10/16/2013 08:33    Scheduled Meds: . albuterol  2.5 mg Nebulization QID  . ARIPiprazole  2 mg Oral Daily  . citalopram  40 mg Oral Daily  . furosemide  40 mg Intravenous Q8H  . gabapentin  400 mg Oral QID  . guaiFENesin  1,200 mg Oral BID  . heparin  5,000 Units Subcutaneous Q8H  . levofloxacin (LEVAQUIN) IV  750 mg Intravenous Q24H  . methadone  135 mg Oral Daily  . multivitamin with minerals  1 tablet Oral Daily  . nicotine  14 mg Transdermal Daily   Continuous Infusions:      Time spent: 35 minutes    [facility] A  [facility] Pager [phone] If 7PM-7AM, please contact night-coverage at [email or web address], password [facility] 10/16/2013, 11:14 AM  LOS: 2 days

## 2013-10-16 NOTE — Consult Note (Signed)
PULMONARY  / CRITICAL CARE MEDICINE  Name: Wendy Douglas MRN: [id number] DOB: July 28, 1976    ADMISSION DATE:  10/14/2013 CONSULTATION DATE: 10/16/13  REFERRING MD :  Arthor Captain PRIMARY SERVICE:  TRH  CHIEF COMPLAINT:  Short of breath  BRIEF PATIENT DESCRIPTION: 37yo F smoker presenting with dyspnea, diffuse patchy infiltrates. Flu PCR neg. Hypoxia- sats 80% RA.   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 4 days PTA slipped on wet floor at work, cutting wrist. No other injury or syncope. 2 days PTA new dry cough and progressive dyspnea. Denies fever, chills, sore throat, swollen glands, myalgias, syncope, N/V. Had not had flu shot. No recognized sick exposure.  Feels well now on O2, except dry cough with deep breath, chest "heavy". Hosp 1/13-20/14- CAP, ?flu, COPD exacerbation. Hosp 7/14-15/14- Acute Resp Failure associate with hx that she had "inhaled some chlorine in a park". Responded rapidly to steroids and bronchodilators. [place] pulmonary/ Dr Sherene Sires 12/02/12- educated smoking cessation. Reference made to spirometry curve, but no recent formal PFT. Normal CXR 05/16/13. Current labs- Influenza PCR neg, HIV neg, Strept Urine Ag neg, Legionella and cultures pend. CT chest now showing diffuse patchy ground glass pneumonitis, no PE. Now desat on room air 81%. Pulmonary consulted. Past hx narcotic dependence. Ongoing methadone compliance. Denies any inhalation other than cigarettes or any IV drug use.  PAST MEDICAL HISTORY :  Past Medical History  Diagnosis Date  . Depression   . Smoker   . Chronic narcotic dependence   . COPD (chronic obstructive pulmonary disease)   . Shortness of breath   . Asthma     IN CHILDHOOD   History reviewed. No pertinent past surgical history. Prior to Admission medications    Medication Sig Start Date End Date Taking? Authorizing Provider  albuterol (PROVENTIL HFA;VENTOLIN HFA) 108 (90 BASE) MCG/ACT inhaler Inhale 2 puffs into the lungs every 6 (six) hours as needed for wheezing. 05/17/13  Yes Rhetta Mura, MD  ARIPiprazole (ABILIFY) 2 MG tablet Take 2 mg by mouth daily.   Yes Historical Provider, MD  citalopram (CELEXA) 40 MG tablet Take 40 mg by mouth daily.   Yes Historical Provider, MD  gabapentin (NEURONTIN) 400 MG capsule Take 400 mg by mouth 4 (four) times daily.   Yes Historical Provider, MD  hydrOXYzine (VISTARIL) 25 MG capsule Take 25 mg by mouth every 6 (six) hours as needed for anxiety.   Yes Historical Provider, MD  methadone (DOLOPHINE) 10 MG/ML solution Take 135 mg by mouth daily.   Yes Historical Provider, MD  Multiple Vitamin (MULTIVITAMIN WITH MINERALS) TABS tablet Take 1 tablet by mouth daily.   Yes Historical Provider, MD   Allergies  Allergen Reactions  . Bee Venom Shortness Of Breath and Swelling  . Sulfa Antibiotics Rash    FAMILY HISTORY:  Family History  Problem Relation Age of Onset  . Multiple sclerosis Mother   . Other Father     Suicide   SOCIAL HISTORY:  reports that she quit smoking about 7 months ago. Her smoking use included Cigarettes. She smoked 0.00 packs per day. She has never used smokeless tobacco. She reports that she drinks about 1.8 ounces of alcohol per week. She reports that she does not use illicit drugs.  REVIEW OF SYSTEMS:   Constitutional: Negative for fever, chills, weight loss, malaise/fatigue  and diaphoresis.  HENT: Negative for hearing loss, ear pain, nosebleeds, congestion, sore throat, neck pain, tinnitus and ear discharge.   Eyes: Negative for blurred vision, double vision, photophobia, pain, discharge and redness.  Respiratory: - HPI Cardiovascular: Negative for chest pain, palpitations, orthopnea, claudication, leg swelling and PND.  Gastrointestinal: Negative for heartburn, nausea, vomiting,  abdominal pain, diarrhea, constipation, blood in stool and melena.  Genitourinary: Negative for dysuria, urgency, frequency, hematuria and flank pain.  Musculoskeletal: Negative for myalgias, back pain, joint pain and falls.  Skin: Negative for itching and rash.  Neurological: Negative for dizziness, tingling, tremors, sensory change, speech change, focal weakness, seizures, loss of consciousness, weakness and headaches.  Endo/Heme/Allergies: Negative for environmental allergies and polydipsia. Does not bruise/bleed easily.  SUBJECTIVE:  HPI VITAL SIGNS: Temp:  [98 F (36.7 C)-99.7 F (37.6 C)] 98.9 F (37.2 C) (12/14 1437) Pulse Rate:  [73-90] 80 (12/14 1437) Resp:  [18] 18 (12/14 1437) BP: (103-120)/(66-74) 120/68 mmHg (12/14 1437) SpO2:  [86 %-97 %] 86 % (12/14 1442)  PHYSICAL EXAMINATION: General:  WDWN wF sitting cross-legged in bed watching TV. Boy friend in room. NAD Neuro:  Oriented, appropriate, non focal HEENT:  Mucosa clear, No stridor or hoarseness Neck:  No JVD or bruit Cardiovascular:  RRR, 1/6 Syst M LUSB, no rub Lungs:  Clear but dry cough w/ deep breath, unlabored on nasal O2 Abdomen:  Soft, no HSM Musculoskeletal:  unremarkable Skin:  No rash, bruise or visible tracks Nodes- none at neck or axillae   Recent Labs Lab 10/14/13 1200 10/14/13 1846 10/15/13 0500 10/16/13 1005  NA 137  --  139 136  K 5.0  --  4.6 4.0  CL 102  --  104 94*  CO2 24  --  25 30  BUN 3*  --  6 9  CREATININE 0.49* 0.54 0.48* 0.65  GLUCOSE 67*  --  81 96    Recent Labs Lab 10/14/13 1156 10/14/13 1846 10/15/13 0500  HGB 11.2* 10.8* 11.4*  HCT 32.9* 31.8* 34.0*  WBC 9.2 10.3 12.2*  PLT 215 165 183   Dg Chest 2 View  10/16/2013   CLINICAL DATA:  Cough, shortness of breath, chest congestion diffuse findings on previous chest radiograph  EXAM: CHEST  2 VIEW  COMPARISON:  10/14/2013  FINDINGS: Diffuse interstitial and airspace infiltrates are once again appreciated unchanged  from prior study. No new focal regions of consolidation are appreciated. Cardiac silhouette and osseous structures are unremarkable.  IMPRESSION: Persistent bilateral mixed interstitial and airspace infiltrates. Saint differential considerations as described previously. Continued surveillance evaluation recommended.   Electronically Signed   By: Salome Holmes M.D.   On: 10/16/2013 08:33   Ct Angio Chest Pe W/cm &/or Wo Cm  10/16/2013   CLINICAL DATA:  Pulmonary embolism.  Decreasing oxygen saturations.  EXAM: CT ANGIOGRAPHY CHEST WITH CONTRAST  TECHNIQUE: Multidetector CT imaging of the chest was performed using the standard protocol during bolus administration of intravenous contrast. Multiplanar CT image reconstructions including MIPs were obtained to evaluate the vascular anatomy.  CONTRAST:  60mL OMNIPAQUE IOHEXOL 350 MG/ML SOLN  COMPARISON:  CT chest 11/15/2012. Chest radiographs dating back to 11/15/2012.  FINDINGS: Technically adequate study without pulmonary embolism. 3 vessel aortic arch appears within normal limits. There is no axillary adenopathy. Residual thymic tissue is present in the anterior mediastinum which appears similar to the prior exam. There is no pericardial or pleural effusion. Incidental imaging of the upper abdomen is within normal limits. No mediastinal adenopathy.  The lung windows demonstrate diffuse geographic ground-glass attenuation. There is relative sparing of the subpleural surface. No areas of consolidation to suggest pneumonia. The central airways are patent.  Review of the MIP images confirms the above findings.  IMPRESSION: 1. Negative for pulmonary embolus or acute aortic abnormality. 2. Geographic diffuse ground-glass attenuation in the lungs with subpleural sparing. The appearance is similar to the prior exam of 11/15/2012 with more severe involvement. There is an intervening normal chest radiograph July 2014. The findings are most consistent with nonspecific  interstitial pneumonitis.   Electronically Signed   By: Andreas Newport M.D.   On: 10/16/2013 16:33   I have reviewed medications and CT images  ASSESSMENT / PLAN: 1) Acute Respiratory Failure- diffuse patchy ground glass infiltrates. Time of year favors CAP/viral pneumonitis. Watch for progression potentially requiring higher care level. She doesn't admit any exposure other than tobacco. ? Too late for drug screen? Would hold steroids for now. Recommend: - respiratory viral screen -Tamiflu -daily CXR   CD Young, MD p [phone]   m [phone] Pulmonary and Critical Care Medicine [facility] Pager: [phone]  10/16/2013, 4:44 PM

## 2013-10-16 NOTE — Progress Notes (Signed)
SATURATION QUALIFICATIONS: (This note is used to comply with regulatory documentation for home oxygen)  Patient Saturations on Room Air at Rest = 92%  Patient Saturations on Room Air while Ambulating = 81%  Patient Saturations on 3 Liters of oxygen while Ambulating = 91%  Please briefly explain why patient needs home oxygen: 

## 2013-10-17 LAB — LEGIONELLA ANTIGEN, URINE

## 2013-10-17 LAB — EXPECTORATED SPUTUM ASSESSMENT W GRAM STAIN, RFLX TO RESP C

## 2013-10-17 LAB — BASIC METABOLIC PANEL
BUN: 12 mg/dL (ref 6–23)
CO2: 31 mEq/L (ref 19–32)
Calcium: 9.9 mg/dL (ref 8.4–10.5)
Chloride: 92 mEq/L — ABNORMAL LOW (ref 96–112)
Creatinine, Ser: 0.64 mg/dL (ref 0.50–1.10)
GFR calc Af Amer: >90 mL/min (ref >90)
Potassium: 4 mEq/L (ref 3.5–5.1)

## 2013-10-17 MED ORDER — POLYETHYLENE GLYCOL 3350 17 G PO PACK
17.0000 g | PACK | Freq: Every day | ORAL | Status: DC
  Administered 2013-10-17 – 2013-10-19 (×3): 17 g via ORAL
  Filled 2013-10-17 (×3): qty 1

## 2013-10-17 NOTE — Progress Notes (Signed)
[facility] PROGRESS NOTE  Wendy Douglas AVW:[id number] DOB: 06/26/1976 DOA: 10/14/2013 PCP: Default, Provider, MD  HPI/Subjective: Hypoxic on room air, CT angio showed no PE, unspecified interstitial pneumonitis. Pulmonology please advise if trial of high dose steroids should be considered.   Assessment/Plan: Principal Problem:   Pneumonia Active Problems:   Chronic narcotic dependence   Smoker   Depression   Pneumonia  -With hypoxia with oxygen saturation of 88% on room air.  -Patient started on Levaquin 750 mg.  -Chest x-ray showing probable atypical infection versus other pathologies mentioned above.  -Discussed over the phone with Dr. Vassie Loll of the [facility] service, recommended to treat as atypical pneumonia.  -Check influenza PCR, Legionella and Streptococcus antigens. If not improving can get CT of the chest.  -Patient did not improve or oxygen saturation increased he recommended to recall [facility] for official consultation.  -Started on IV Lasix, chest x-ray from this morning no changes. -Pulmonology following, patient still on oxygen waiting on their recommendations,? Biopsy/steroids.  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  -Patient reports that she takes 135 mg of methadone.  -[facility] and that patient takes 135 mg of methadone daily.  Tobacco abuse  -Patient counseled extensively, she used to smoke one pack per day, she is down to half pack per day.  -Requested nicotine patch.   Depression  -Continue home medications.  Code Status: Full code Family Communication: Plan discussed with the patient. Disposition Plan: Remains inpatient   Consultants:  None  Procedures:  None  Antibiotics:  Levofloxacin  Objective: Filed Vitals:   10/17/13 0557  BP:   Pulse: 72  Temp:   Resp: 20    Intake/Output Summary (Last 24 hours) at 10/17/13 1022 Last data filed  at 10/17/13 0908  Gross per 24 hour  Intake    840 ml  Output   1100 ml  Net   -260 ml   Filed Weights   10/14/13 1730 10/15/13 0739  Weight: 74.98 kg (165 lb 4.8 oz) 70.9 kg (156 lb 4.9 oz)    Exam: General: Alert and awake, oriented x3, not in any acute distress. HEENT: anicteric sclera, pupils reactive to light and accommodation, EOMI CVS: S1-S2 clear, no murmur rubs or gallops Chest: clear to auscultation bilaterally, no wheezing, rales or rhonchi Abdomen: soft nontender, nondistended, normal bowel sounds, no organomegaly Extremities: no cyanosis, clubbing or edema noted bilaterally Neuro: Cranial nerves II-XII intact, no focal neurological deficits  Data Reviewed: Basic Metabolic Panel:  Recent Labs Lab 10/14/13 1200 10/14/13 1846 10/15/13 0500 10/16/13 1005  NA 137  --  139 136  K 5.0  --  4.6 4.0  CL 102  --  104 94*  CO2 24  --  25 30  GLUCOSE 67*  --  81 96  BUN 3*  --  6 9  CREATININE 0.49* 0.54 0.48* 0.65  CALCIUM 8.8  --  9.0 9.6   Liver Function Tests:  Recent Labs Lab 10/14/13 1200  AST 47*  ALT 12  ALKPHOS 104  BILITOT 0.4  PROT 7.7  ALBUMIN 3.1*   No results found for this basename: LIPASE, AMYLASE,  in the last 168 hours No results found for this basename: AMMONIA,  in the last 168 hours CBC:  Recent Labs Lab 10/14/13 1156 10/14/13 1846 10/15/13 0500  WBC 9.2 10.3 12.2*  NEUTROABS 7.1  --   --   HGB 11.2* 10.8*  11.4*  HCT 32.9* 31.8* 34.0*  MCV 92.2 93.5 93.4  PLT 215 165 183   Cardiac Enzymes: No results found for this basename: CKTOTAL, CKMB, CKMBINDEX, TROPONINI,  in the last 168 hours BNP (last 3 results)  Recent Labs  10/14/13 1846  PROBNP 334.1*   CBG: No results found for this basename: GLUCAP,  in the last 168 hours  Micro Recent Results (from the past 240 hour(s))  CULTURE, BLOOD (ROUTINE X 2)     Status: None   Collection Time    10/14/13  6:46 PM      Result Value Range Status   Specimen Description BLOOD  ARM LEFT   Final   Special Requests BOTTLES DRAWN AEROBIC ONLY 5CC   Final   Culture  Setup Time     Final   Value: 10/15/2013 06:33     Performed at [facility]   Culture     Final   Value:        BLOOD CULTURE RECEIVED NO GROWTH TO DATE CULTURE WILL BE HELD FOR 5 DAYS BEFORE ISSUING A FINAL NEGATIVE REPORT     Performed at [facility]   Report Status PENDING   Incomplete  CULTURE, BLOOD (ROUTINE X 2)     Status: None   Collection Time    10/14/13  6:51 PM      Result Value Range Status   Specimen Description BLOOD HAND LEFT   Final   Special Requests BOTTLES DRAWN AEROBIC ONLY 5CC   Final   Culture  Setup Time     Final   Value: 10/15/2013 07:50     Performed at [facility]   Culture     Final   Value:        BLOOD CULTURE RECEIVED NO GROWTH TO DATE CULTURE WILL BE HELD FOR 5 DAYS BEFORE ISSUING A FINAL NEGATIVE REPORT     Performed at [facility]   Report Status PENDING   Incomplete  CULTURE, EXPECTORATED SPUTUM-ASSESSMENT     Status: None   Collection Time    10/16/13  7:01 AM      Result Value Range Status   Specimen Description SPUTUM   Final   Special Requests NONE   Final   Sputum evaluation     Final   Value: MICROSCOPIC FINDINGS SUGGEST THAT THIS SPECIMEN IS NOT REPRESENTATIVE OF LOWER RESPIRATORY SECRETIONS. PLEASE RECOLLECT.     CALLED TO WESSELINK,JESSE RN 10/16/13 0945 WOOTEN,K   Report Status 10/16/2013 FINAL   Final  CULTURE, EXPECTORATED SPUTUM-ASSESSMENT     Status: None   Collection Time    10/17/13  5:40 AM      Result Value Range Status   Specimen Description SPUTUM   Final   Special Requests NONE   Final   Sputum evaluation     Final   Value: THIS SPECIMEN IS ACCEPTABLE. RESPIRATORY CULTURE REPORT TO FOLLOW.   Report Status 10/17/2013 FINAL   Final     Studies: Dg Chest 2 View  10/16/2013   CLINICAL DATA:  Cough, shortness of breath, chest congestion diffuse findings on previous chest radiograph  EXAM: CHEST  2  VIEW  COMPARISON:  10/14/2013  FINDINGS: Diffuse interstitial and airspace infiltrates are once again appreciated unchanged from prior study. No new focal regions of consolidation are appreciated. Cardiac silhouette and osseous structures are unremarkable.  IMPRESSION: Persistent bilateral mixed interstitial and airspace infiltrates. Saint differential considerations as described previously. Continued surveillance evaluation recommended.  Electronically Signed   By: Salome Holmes M.D.   On: 10/16/2013 08:33   Ct Angio Chest Pe W/cm &/or Wo Cm  10/16/2013   CLINICAL DATA:  Pulmonary embolism.  Decreasing oxygen saturations.  EXAM: CT ANGIOGRAPHY CHEST WITH CONTRAST  TECHNIQUE: Multidetector CT imaging of the chest was performed using the standard protocol during bolus administration of intravenous contrast. Multiplanar CT image reconstructions including MIPs were obtained to evaluate the vascular anatomy.  CONTRAST:  60mL OMNIPAQUE IOHEXOL 350 MG/ML SOLN  COMPARISON:  CT chest 11/15/2012. Chest radiographs dating back to 11/15/2012.  FINDINGS: Technically adequate study without pulmonary embolism. 3 vessel aortic arch appears within normal limits. There is no axillary adenopathy. Residual thymic tissue is present in the anterior mediastinum which appears similar to the prior exam. There is no pericardial or pleural effusion. Incidental imaging of the upper abdomen is within normal limits. No mediastinal adenopathy.  The lung windows demonstrate diffuse geographic ground-glass attenuation. There is relative sparing of the subpleural surface. No areas of consolidation to suggest pneumonia. The central airways are patent.  Review of the MIP images confirms the above findings.  IMPRESSION: 1. Negative for pulmonary embolus or acute aortic abnormality. 2. Geographic diffuse ground-glass attenuation in the lungs with subpleural sparing. The appearance is similar to the prior exam of 11/15/2012 with more severe  involvement. There is an intervening normal chest radiograph July 2014. 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   Electronically Signed   By: Andreas Newport M.D.   On: 10/16/2013 16:33    Scheduled Meds: . albuterol  2.5 mg Nebulization TID  . ARIPiprazole  2 mg Oral Daily  . citalopram  40 mg Oral Daily  . furosemide  40 mg Intravenous Q8H  . gabapentin  400 mg Oral QID  . guaiFENesin  1,200 mg Oral BID  . heparin  5,000 Units Subcutaneous Q8H  . ipratropium  0.5 mg Nebulization TID  . levofloxacin (LEVAQUIN) IV  750 mg Intravenous Q24H  . methadone  135 mg Oral Daily  . multivitamin with minerals  1 tablet Oral Daily  . nicotine  14 mg Transdermal Daily  . oseltamivir  75 mg Oral BID   Continuous Infusions:      Time spent: 35 minutes    [facility] A  [facility] Pager [phone] If 7PM-7AM, please contact night-coverage at [email or web address], password [facility] 10/17/2013, 10:22 AM  LOS: 3 days

## 2013-10-17 NOTE — Care Management Note (Signed)
    Page 1 of 1   10/19/2013     2:24:03 PM   CARE MANAGEMENT NOTE 10/19/2013  Patient:  Wendy Douglas, Wendy Douglas   Account Number:  [id number]  Date Initiated:  10/17/2013  Documentation initiated by:  Letha Cape  Subjective/Objective Assessment:   dx pna  admit- lives with boyfriend.  patient has had home oygen with [facility] in the past.     Action/Plan:   Anticipated DC Date:  10/19/2013   Anticipated DC Plan:  HOME/SELF CARE      DC Planning Services  CM consult      Choice offered to / List presented to:  C-1 Patient           Status of service:  Completed, signed off Medicare Important Message given?   (If response is "NO", the following Medicare IM given date fields will be blank) Date Medicare IM given:   Date Additional Medicare IM given:    Discharge Disposition:  HOME/SELF CARE  Per UR Regulation:  Reviewed for med. necessity/level of care/duration of stay  If discussed at Long Length of Stay Meetings, dates discussed:    Comments:  10/19/13 14:22 Letha Cape RN, BSN [phone] patient saturations checked today, and did not qualify for neededing home oxygen.  NCM gave patient Match letter for medications and paperwork for [facility].  10/18/13 12:41 Letha Cape RN, BSN [phone] patient is for possible dc tomorrow, will need home oxygen, which patient will need to be private pay with [facility]  on pmt plan like she did before.  Patient states she will also need assit with medications,  informed patient that I could ast her with the Match Program if she has not used before, she states she had not.  Informed her this would be the only time she could use the Match Program this year, she understood.  Patient has transportation to go home.  NCM will get apt for patient at [facility].  10/17/13 14:46 Letha Cape RN, BSN [phone] patient lives with boyfriend, pta indep.  Per MD patient may need home oxygen when she goes home, not sure.  NCM  will continue to follow for dc needs.

## 2013-10-17 NOTE — Progress Notes (Signed)
PULMONARY  / CRITICAL CARE MEDICINE  Name: Wendy Douglas MRN: [id number] DOB: 23-Jan-1976    ADMISSION DATE:  10/14/2013 CONSULTATION DATE:  10/14/2013  REFERRING MD :  [facility]  CHIEF COMPLAINT:  Short of breath  BRIEF PATIENT DESCRIPTION:  37 yo female smoker with progressive dyspnea and diffuse patchy infiltrates on CT chest.  Seen previously by Dr. Sherene Sires.  She was treated for H1N1 influenza in January 2014, chlorine inhalation in July 2014.  SIGNIFICANT EVENTS: 12/12 Admit  STUDIES:  CT chest 12/14 >> diffuse b/l GGO with relative sparing of sub-pleura  LINES / TUBES: PIV  CULTURES: HIV 12/12 >> non reactive Influenza PCR 12/12 >> negative Legionella 12/12 >> negative Pneumococcal 12/12 >> negative Blood 12/12 >>  Sputum 12/12 >>  Respiratory viral panel 12/14 >>   ANTIBIOTICS: Levaquin 12/12 >> Tamiflu 12/14 >>   SUBJECTIVE:   VITAL SIGNS: Temp:  [98.1 F (36.7 C)-98.9 F (37.2 C)] 98.1 F (36.7 C) (12/15 0528) Pulse Rate:  [72-85] 72 (12/15 0557) Resp:  [16-20] 20 (12/15 0557) BP: (111-126)/(66-78) 111/66 mmHg (12/15 0528) SpO2:  [86 %-96 %] 96 % (12/15 0748) 4 liters [place]  PHYSICAL EXAMINATION: General:  No distress Neuro:  Normal strength HEENT:  No sinus tenderness Cardiovascular:  regular Lungs:  No wheeze Abdomen:  Soft, non tender Musculoskeletal:  No edema Skin:  No rashes  Labs: CBC Recent Labs     10/14/13  1846  10/15/13  0500  WBC  10.3  12.2*  HGB  10.8*  11.4*  HCT  31.8*  34.0*  PLT  165  183   BMET Recent Labs     10/15/13  0500  10/16/13  1005  10/17/13  1125  NA  139  136  137  K  4.6  4.0  4.0  CL  104  94*  92*  CO2  25  30  31   BUN  6  9  12   CREATININE  0.48*  0.65  0.64  GLUCOSE  81  96  91    Electrolytes Recent Labs     10/15/13  0500  10/16/13  1005  10/17/13  1125  CALCIUM  9.0  9.6  9.9    Cardiac Enzymes Recent Labs     10/14/13  1846  PROBNP  334.1*   Imaging Dg Chest 2 View  10/16/2013    CLINICAL DATA:  Cough, shortness of breath, chest congestion diffuse findings on previous chest radiograph  EXAM: CHEST  2 VIEW  COMPARISON:  10/14/2013  FINDINGS: Diffuse interstitial and airspace infiltrates are once again appreciated unchanged from prior study. No new focal regions of consolidation are appreciated. Cardiac silhouette and osseous structures are unremarkable.  IMPRESSION: Persistent bilateral mixed interstitial and airspace infiltrates. Saint differential considerations as described previously. Continued surveillance evaluation recommended.   Electronically Signed   By: Salome Holmes M.D.   On: 10/16/2013 08:33   Ct Angio Chest Pe W/cm &/or Wo Cm  10/16/2013   CLINICAL DATA:  Pulmonary embolism.  Decreasing oxygen saturations.  EXAM: CT ANGIOGRAPHY CHEST WITH CONTRAST  TECHNIQUE: Multidetector CT imaging of the chest was performed using the standard protocol during bolus administration of intravenous contrast. Multiplanar CT image reconstructions including MIPs were obtained to evaluate the vascular anatomy.  CONTRAST:  60mL OMNIPAQUE IOHEXOL 350 MG/ML SOLN  COMPARISON:  CT chest 11/15/2012. Chest radiographs dating back to 11/15/2012.  FINDINGS: Technically adequate study without pulmonary embolism. 3 vessel aortic arch appears within normal limits. There  is no axillary adenopathy. Residual thymic tissue is present in the anterior mediastinum which appears similar to the prior exam. There is no pericardial or pleural effusion. Incidental imaging of the upper abdomen is within normal limits. No mediastinal adenopathy.  The lung windows demonstrate diffuse geographic ground-glass attenuation. There is relative sparing of the subpleural surface. No areas of consolidation to suggest pneumonia. The central airways are patent.  Review of the MIP images confirms the above findings.  IMPRESSION: 1. Negative for pulmonary embolus or acute aortic abnormality. 2. Geographic diffuse ground-glass  attenuation in the lungs with subpleural sparing. The appearance is similar to the prior exam of 11/15/2012 with more severe involvement. There is an intervening normal chest radiograph July 2014. 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   Electronically Signed   By: Andreas Newport M.D.   On: 10/16/2013 16:33      ASSESSMENT / PLAN:  37 yo female smoker with recurrent GGO pulmonary infiltrates.  She has hx of H1N1 influenza in January 2014.  Current radiographic findings could be related to viral pneumonitis.  Concern that she has this twice within one year. Plan: -check auto-immune serology -f/u PA/later CXR 12/16 -defer empiric steroids, biopsy for now  -might need to assess for temporary home oxygen set up -continue levaquin, tamiflu -f/u sputum cx, respiratory viral panel -she needs to stop smoking  Coralyn Helling, MD [facility] Pulmonary/Critical Care 10/17/2013, 1:58 PM Pager:  [phone] After 3pm call: [phone]

## 2013-10-18 ENCOUNTER — Inpatient Hospital Stay (HOSPITAL_COMMUNITY): Payer: MEDICAID

## 2013-10-18 LAB — BASIC METABOLIC PANEL
BUN: 13 mg/dL (ref 6–23)
Calcium: 10.1 mg/dL (ref 8.4–10.5)
GFR calc non Af Amer: >90 mL/min (ref >90)
Glucose, Bld: 83 mg/dL (ref 70–99)
Potassium: 4.7 mEq/L (ref 3.5–5.1)

## 2013-10-18 LAB — RESPIRATORY VIRUS PANEL
Influenza A H3: NOT DETECTED
Influenza A: NOT DETECTED
Parainfluenza 1: NOT DETECTED
Parainfluenza 2: NOT DETECTED
Parainfluenza 3: NOT DETECTED
Respiratory Syncytial Virus A: NOT DETECTED
Respiratory Syncytial Virus B: NOT DETECTED

## 2013-10-18 LAB — SJOGRENS SYNDROME-A EXTRACTABLE NUCLEAR ANTIBODY: SSA (Ro) (ENA) Antibody, IgG: 31 AU/mL — ABNORMAL HIGH (ref <30)

## 2013-10-18 LAB — CYCLIC CITRUL PEPTIDE ANTIBODY, IGG: Cyclic Citrullin Peptide Ab: <2 U/mL (ref 0.0–5.0)

## 2013-10-18 LAB — ANTI-SCLERODERMA ANTIBODY: Scleroderma (Scl-70) (ENA) Antibody, IgG: <1 AU/mL (ref <30)

## 2013-10-18 LAB — ANA: Anti Nuclear Antibody(ANA): NEGATIVE

## 2013-10-18 MED ORDER — LEVOFLOXACIN 750 MG PO TABS
750.0000 mg | ORAL_TABLET | Freq: Every day | ORAL | Status: AC
  Administered 2013-10-18: 750 mg via ORAL
  Filled 2013-10-18: qty 1

## 2013-10-18 NOTE — Progress Notes (Signed)
PULMONARY  / CRITICAL CARE MEDICINE  Name: Wendy Douglas MRN: [id number] DOB: Jun 01, 1976    ADMISSION DATE:  10/14/2013 CONSULTATION DATE:  10/14/2013  REFERRING MD :  [facility]  CHIEF COMPLAINT:  Short of breath  BRIEF PATIENT DESCRIPTION:  37 yo female smoker with progressive dyspnea and diffuse patchy infiltrates on CT chest.  Seen previously by Dr. Sherene Sires.  She was treated for H1N1 influenza in January 2014, chlorine inhalation in July 2014.  SIGNIFICANT EVENTS: 12/12 Admit  STUDIES:  CT chest 12/14 >> diffuse b/l GGO with relative sparing of sub-pleura Labs 12/15 >> ESR 121, RF < 10  LINES / TUBES: PIV  CULTURES: HIV 12/12 >> non reactive Influenza PCR 12/12 >> negative Legionella 12/12 >> negative Pneumococcal 12/12 >> negative Blood 12/12 >>  Sputum 12/12 >>  Respiratory viral panel 12/14 >>   ANTIBIOTICS: Levaquin 12/12 >> Tamiflu 12/14 >>   SUBJECTIVE:  Still has cough.  Breathing better.  Denies chest pain. VITAL SIGNS: Temp:  [98.6 F (37 C)-98.9 F (37.2 C)] 98.7 F (37.1 C) (12/16 0630) Pulse Rate:  [65-80] 65 (12/16 0630) Resp:  [18] 18 (12/16 0630) BP: (103-121)/(67-75) 103/67 mmHg (12/16 0630) SpO2:  [91 %-99 %] 94 % (12/16 0630) 2 liters [place]  PHYSICAL EXAMINATION: General:  No distress Neuro:  Normal strength HEENT:  No sinus tenderness Cardiovascular:  regular Lungs:  No wheeze, basilar crackles Abdomen:  Soft, non tender Musculoskeletal:  No edema Skin:  No rashes  Labs: CBC No results found for this basename: WBC, HGB, HCT, PLT,  in the last 72 hours BMET Recent Labs     10/16/13  1005  10/17/13  1125  10/18/13  0509  NA  136  137  136  K  4.0  4.0  4.7  CL  94*  92*  91*  CO2  30  31  32  BUN  9  12  13   CREATININE  0.65  0.64  0.68  GLUCOSE  96  91  83    Electrolytes Recent Labs     10/16/13  1005  10/17/13  1125  10/18/13  0509  CALCIUM  9.6  9.9  10.1    Cardiac Enzymes No results found for this basename:  TROPONINI, PROBNP,  in the last 72 hours Imaging Dg Chest 2 View  10/18/2013   CLINICAL DATA:  Shortness of breath, cough, pneumonia.  EXAM: CHEST  2 VIEW  COMPARISON:  10/16/2013  FINDINGS: Patchy bilateral alveolar opacities are noted with interstitial prominence. Appearance slightly improved since prior study. Heart is normal size. No visible effusions. No acute bony abnormality.  IMPRESSION: Slight interval improvement in patchy bilateral airspace opacities and diffuse interstitial prominence.   Electronically Signed   By: Charlett Nose M.D.   On: 10/18/2013 08:16   Ct Angio Chest Pe W/cm &/or Wo Cm  10/16/2013   CLINICAL DATA:  Pulmonary embolism.  Decreasing oxygen saturations.  EXAM: CT ANGIOGRAPHY CHEST WITH CONTRAST  TECHNIQUE: Multidetector CT imaging of the chest was performed using the standard protocol during bolus administration of intravenous contrast. Multiplanar CT image reconstructions including MIPs were obtained to evaluate the vascular anatomy.  CONTRAST:  60mL OMNIPAQUE IOHEXOL 350 MG/ML SOLN  COMPARISON:  CT chest 11/15/2012. Chest radiographs dating back to 11/15/2012.  FINDINGS: Technically adequate study without pulmonary embolism. 3 vessel aortic arch appears within normal limits. There is no axillary adenopathy. Residual thymic tissue is present in the anterior mediastinum which appears similar to the prior  exam. There is no pericardial or pleural effusion. Incidental imaging of the upper abdomen is within normal limits. No mediastinal adenopathy.  The lung windows demonstrate diffuse geographic ground-glass attenuation. There is relative sparing of the subpleural surface. No areas of consolidation to suggest pneumonia. The central airways are patent.  Review of the MIP images confirms the above findings.  IMPRESSION: 1. Negative for pulmonary embolus or acute aortic abnormality. 2. Geographic diffuse ground-glass attenuation in the lungs with subpleural sparing. The appearance is  similar to the prior exam of 11/15/2012 with more severe involvement. There is an intervening normal chest radiograph July 2014. 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   Electronically Signed   By: Andreas Newport M.D.   On: 10/16/2013 16:33      ASSESSMENT / PLAN:  37 yo female smoker with recurrent GGO pulmonary infiltrates.  She has hx of H1N1 influenza in January 2014.  Current radiographic findings could be related to viral pneumonitis.  Concern that she has this twice within one year. Plan: -check auto-immune serology -f/u CXR intermittently -defer empiric steroids, biopsy for now since she has clinical improvement -might need to assess for temporary home oxygen set up -continue levaquin, tamiflu -f/u sputum cx, respiratory viral panel -she needs to stop smoking -agree with negative fluid balance  D/w Dr. Arthor Captain.  Coralyn Helling, MD [facility] Pulmonary/Critical Care 10/18/2013, 8:20 AM Pager:  [phone] After 3pm call: [phone]

## 2013-10-18 NOTE — Progress Notes (Addendum)
[facility] PROGRESS NOTE  Abri Vacca UUV:[id number] DOB: 22-Oct-1976 DOA: 10/14/2013 PCP: Default, Provider, MD  HPI/Subjective: Feels better, chest x-ray shows slight improvement.  Assessment/Plan: Principal Problem:   Pneumonia Active Problems:   Chronic narcotic dependence   Smoker   Depression   Acute hypoxic respiratory failure -With hypoxia with oxygen saturation of 81% on room air.  -Patient started on Levaquin 750 mg.  -Chest x-ray showing probable atypical infection versus other causes of interstitial pneumonitis -Discussed over the phone with Dr. Vassie Loll of the [facility] service, recommended to treat as atypical pneumonia.  -Check influenza PCR, Legionella and Streptococcus antigens. If not improving can get CT of the chest.  -Patient did not improve or oxygen saturation increased he recommended to recall [facility] for official consultation.  -Pulmonology following, discussed with Dr. Craige Cotta. -Continue Lasix, keep negative balance, check 2-D echo. -Patient's sedimentation rate is 121, negative rheumatoid factor, rest of the autoimmune workup is pending.  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  -Patient reports that she takes 135 mg of methadone.  -[facility] verified [facility] and that patient takes 135 mg of methadone daily.  Tobacco abuse  -Patient counseled extensively, she used to smoke one pack per day, she is down to half pack per day.  -Requested nicotine patch.   Depression  -Continue home medications.  Code Status: Full code Family Communication: Plan discussed with the patient. Disposition Plan: Remains inpatient   Consultants:  None  Procedures:  None  Antibiotics:  Levofloxacin  Objective: Filed Vitals:   10/18/13 0831  BP:   Pulse: 84  Temp:   Resp: 18    Intake/Output Summary (Last 24 hours) at 10/18/13 1044 Last data filed at 10/18/13 1008  Gross per 24  hour  Intake   1422 ml  Output   4150 ml  Net  -2728 ml   Filed Weights   10/14/13 1730 10/15/13 0739  Weight: 74.98 kg (165 lb 4.8 oz) 70.9 kg (156 lb 4.9 oz)    Exam: General: Alert and awake, oriented x3, not in any acute distress. HEENT: anicteric sclera, pupils reactive to light and accommodation, EOMI CVS: S1-S2 clear, no murmur rubs or gallops Chest: clear to auscultation bilaterally, no wheezing, rales or rhonchi Abdomen: soft nontender, nondistended, normal bowel sounds, no organomegaly Extremities: no cyanosis, clubbing or edema noted bilaterally Neuro: Cranial nerves II-XII intact, no focal neurological deficits  Data Reviewed: Basic Metabolic Panel:  Recent Labs Lab 10/14/13 1200 10/14/13 1846 10/15/13 0500 10/16/13 1005 10/17/13 1125 10/18/13 0509  NA 137  --  139 136 137 136  K 5.0  --  4.6 4.0 4.0 4.7  CL 102  --  104 94* 92* 91*  CO2 24  --  25 30 31  32  GLUCOSE 67*  --  81 96 91 83  BUN 3*  --  6 9 12 13   CREATININE 0.49* 0.54 0.48* 0.65 0.64 0.68  CALCIUM 8.8  --  9.0 9.6 9.9 10.1   Liver Function Tests:  Recent Labs Lab 10/14/13 1200  AST 47*  ALT 12  ALKPHOS 104  BILITOT 0.4  PROT 7.7  ALBUMIN 3.1*   No results found for this basename: LIPASE, AMYLASE,  in the last 168 hours No results found for this basename: AMMONIA,  in the last 168 hours CBC:  Recent Labs Lab 10/14/13 1156 10/14/13 1846 10/15/13 0500  WBC 9.2 10.3 12.2*  NEUTROABS 7.1  --   --  HGB 11.2* 10.8* 11.4*  HCT 32.9* 31.8* 34.0*  MCV 92.2 93.5 93.4  PLT 215 165 183   Cardiac Enzymes: No results found for this basename: CKTOTAL, CKMB, CKMBINDEX, TROPONINI,  in the last 168 hours BNP (last 3 results)  Recent Labs  10/14/13 1846  PROBNP 334.1*   CBG: No results found for this basename: GLUCAP,  in the last 168 hours  Micro Recent Results (from the past 240 hour(s))  CULTURE, BLOOD (ROUTINE X 2)     Status: None   Collection Time    10/14/13  6:46 PM       Result Value Range Status   Specimen Description BLOOD ARM LEFT   Final   Special Requests BOTTLES DRAWN AEROBIC ONLY 5CC   Final   Culture  Setup Time     Final   Value: 10/15/2013 06:33     Performed at [facility]   Culture     Final   Value:        BLOOD CULTURE RECEIVED NO GROWTH TO DATE CULTURE WILL BE HELD FOR 5 DAYS BEFORE ISSUING A FINAL NEGATIVE REPORT     Performed at [facility]   Report Status PENDING   Incomplete  CULTURE, BLOOD (ROUTINE X 2)     Status: None   Collection Time    10/14/13  6:51 PM      Result Value Range Status   Specimen Description BLOOD HAND LEFT   Final   Special Requests BOTTLES DRAWN AEROBIC ONLY 5CC   Final   Culture  Setup Time     Final   Value: 10/15/2013 07:50     Performed at [facility]   Culture     Final   Value:        BLOOD CULTURE RECEIVED NO GROWTH TO DATE CULTURE WILL BE HELD FOR 5 DAYS BEFORE ISSUING A FINAL NEGATIVE REPORT     Performed at [facility]   Report Status PENDING   Incomplete  CULTURE, EXPECTORATED SPUTUM-ASSESSMENT     Status: None   Collection Time    10/16/13  7:01 AM      Result Value Range Status   Specimen Description SPUTUM   Final   Special Requests NONE   Final   Sputum evaluation     Final   Value: MICROSCOPIC FINDINGS SUGGEST THAT THIS SPECIMEN IS NOT REPRESENTATIVE OF LOWER RESPIRATORY SECRETIONS. PLEASE RECOLLECT.     CALLED TO WESSELINK,JESSE RN 10/16/13 0945 WOOTEN,K   Report Status 10/16/2013 FINAL   Final  CULTURE, EXPECTORATED SPUTUM-ASSESSMENT     Status: None   Collection Time    10/17/13  5:40 AM      Result Value Range Status   Specimen Description SPUTUM   Final   Special Requests NONE   Final   Sputum evaluation     Final   Value: THIS SPECIMEN IS ACCEPTABLE. RESPIRATORY CULTURE REPORT TO FOLLOW.   Report Status 10/17/2013 FINAL   Final  CULTURE, RESPIRATORY (NON-EXPECTORATED)     Status: None   Collection Time    10/17/13  5:40 AM      Result  Value Range Status   Specimen Description SPUTUM   Final   Special Requests NONE   Final   Gram Stain     Final   Value: FEW WBC PRESENT,BOTH PMN AND MONONUCLEAR     RARE SQUAMOUS EPITHELIAL CELLS PRESENT     NO ORGANISMS SEEN  Performed at [facility] PENDING   Incomplete   Report Status PENDING   Incomplete     Studies: Dg Chest 2 View  10/18/2013   CLINICAL DATA:  Shortness of breath, cough, pneumonia.  EXAM: CHEST  2 VIEW  COMPARISON:  10/16/2013  FINDINGS: Patchy bilateral alveolar opacities are noted with interstitial prominence. Appearance slightly improved since prior study. Heart is normal size. No visible effusions. No acute bony abnormality.  IMPRESSION: Slight interval improvement in patchy bilateral airspace opacities and diffuse interstitial prominence.   Electronically Signed   By: Charlett Nose M.D.   On: 10/18/2013 08:16   Ct Angio Chest Pe W/cm &/or Wo Cm  10/16/2013   CLINICAL DATA:  Pulmonary embolism.  Decreasing oxygen saturations.  EXAM: CT ANGIOGRAPHY CHEST WITH CONTRAST  TECHNIQUE: Multidetector CT imaging of the chest was performed using the standard protocol during bolus administration of intravenous contrast. Multiplanar CT image reconstructions including MIPs were obtained to evaluate the vascular anatomy.  CONTRAST:  60mL OMNIPAQUE IOHEXOL 350 MG/ML SOLN  COMPARISON:  CT chest 11/15/2012. Chest radiographs dating back to 11/15/2012.  FINDINGS: Technically adequate study without pulmonary embolism. 3 vessel aortic arch appears within normal limits. There is no axillary adenopathy. Residual thymic tissue is present in the anterior mediastinum which appears similar to the prior exam. There is no pericardial or pleural effusion. Incidental imaging of the upper abdomen is within normal limits. No mediastinal adenopathy.  The lung windows demonstrate diffuse geographic ground-glass attenuation. There is relative sparing of the subpleural surface. No  areas of consolidation to suggest pneumonia. The central airways are patent.  Review of the MIP images confirms the above findings.  IMPRESSION: 1. Negative for pulmonary embolus or acute aortic abnormality. 2. Geographic diffuse ground-glass attenuation in the lungs with subpleural sparing. The appearance is similar to the prior exam of 11/15/2012 with more severe involvement. There is an intervening normal chest radiograph July 2014. 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   Electronically Signed   By: Andreas Newport M.D.   On: 10/16/2013 16:33    Scheduled Meds: . albuterol  2.5 mg Nebulization TID  . ARIPiprazole  2 mg Oral Daily  . citalopram  40 mg Oral Daily  . furosemide  40 mg Intravenous Q8H  . gabapentin  400 mg Oral QID  . guaiFENesin  1,200 mg Oral BID  . heparin  5,000 Units Subcutaneous Q8H  . ipratropium  0.5 mg Nebulization TID  . levofloxacin  750 mg Oral q1800  . methadone  135 mg Oral Daily  . multivitamin with minerals  1 tablet Oral Daily  . nicotine  14 mg Transdermal Daily  . oseltamivir  75 mg Oral BID  . polyethylene glycol  17 g Oral Daily   Continuous Infusions:      Time spent: 35 minutes    [facility]  [facility] Pager [phone] If 7PM-7AM, please contact night-coverage at [email or web address], password [facility] 10/18/2013, 10:44 AM  LOS: 4 days

## 2013-10-18 NOTE — Progress Notes (Signed)
Pt back from CT

## 2013-10-19 DIAGNOSIS — F329 Major depressive disorder, single episode, unspecified: Secondary | ICD-10-CM

## 2013-10-19 LAB — CULTURE, RESPIRATORY W GRAM STAIN

## 2013-10-19 LAB — ANCA SCREEN W REFLEX TITER
c-ANCA Screen: NEGATIVE
p-ANCA Screen: NEGATIVE

## 2013-10-19 MED ORDER — IPRATROPIUM BROMIDE 0.02 % IN SOLN: 0.5000 mg | Freq: Three times a day (TID) | RESPIRATORY_TRACT | Status: DC

## 2013-10-19 MED ORDER — GUAIFENESIN ER 600 MG PO TB12: 1200.0000 mg | ORAL_TABLET | Freq: Two times a day (BID) | ORAL | Status: DC

## 2013-10-19 MED ORDER — NICOTINE 14 MG/24HR TD PT24: 14.0000 mg | MEDICATED_PATCH | Freq: Every day | TRANSDERMAL | Status: DC

## 2013-10-19 MED ORDER — ALBUTEROL SULFATE (5 MG/ML) 0.5% IN NEBU: 2.5000 mg | INHALATION_SOLUTION | Freq: Three times a day (TID) | RESPIRATORY_TRACT | Status: DC

## 2013-10-19 NOTE — Progress Notes (Signed)
PULMONARY  / CRITICAL CARE MEDICINE  Name: Wendy Douglas MRN: [id number] DOB: 1976-01-17    ADMISSION DATE:  10/14/2013 CONSULTATION DATE:  10/14/2013  REFERRING MD :  [facility]  CHIEF COMPLAINT:  Short of breath  BRIEF PATIENT DESCRIPTION:  37 yo female smoker with progressive dyspnea and diffuse patchy infiltrates on CT chest.  Seen previously by Dr. Sherene Sires.  She was treated for H1N1 influenza in January 2014, chlorine inhalation in July 2014.  SIGNIFICANT EVENTS: 12/12 Admit  STUDIES:  CT chest 12/14 >> diffuse b/l GGO with relative sparing of sub-pleura Labs 12/15 >> ESR 121, RF < 10, anti-CCP < 2, SS-B 24, SS-A 31, Scl-70 < 1, ANA negative  LINES / TUBES: PIV  CULTURES: HIV 12/12 >> non reactive Influenza PCR 12/12 >> negative Legionella 12/12 >> negative Pneumococcal 12/12 >> negative Blood 12/12 >>  Sputum 12/12 >> oral flora Respiratory viral panel 12/14 >> negative  ANTIBIOTICS: Levaquin 12/12 >> 12/17 Tamiflu 12/14 >> 12/17   SUBJECTIVE:  Still has cough.  Breathing better.  Denies chest pain.  Still needing supplemental oxygen.  VITAL SIGNS: Temp:  [97.6 F (36.4 C)-99.6 F (37.6 C)] 97.6 F (36.4 C) (12/17 0602) Pulse Rate:  [68-84] 68 (12/17 0602) Resp:  [18-22] 18 (12/17 0602) BP: (105-122)/(70-81) 105/70 mmHg (12/17 0602) SpO2:  [95 %-100 %] 97 % (12/17 0847) 2 liters [place]  PHYSICAL EXAMINATION: General:  No distress Neuro:  Normal strength HEENT:  No sinus tenderness Cardiovascular:  regular Lungs:  Improved air movement, no wheeze, decreased basilar crackles Abdomen:  Soft, non tender Musculoskeletal:  No edema Skin:  No rashes  Labs: BMET Recent Labs     10/16/13  1005  10/17/13  1125  10/18/13  0509  NA  136  137  136  K  4.0  4.0  4.7  CL  94*  92*  91*  CO2  30  31  32  BUN  9  12  13   CREATININE  0.65  0.64  0.68  GLUCOSE  96  91  83    Electrolytes Recent Labs     10/16/13  1005  10/17/13  1125  10/18/13  0509  CALCIUM   9.6  9.9  10.1   Imaging Dg Chest 2 View  10/18/2013   CLINICAL DATA:  Shortness of breath, cough, pneumonia.  EXAM: CHEST  2 VIEW  COMPARISON:  10/16/2013  FINDINGS: Patchy bilateral alveolar opacities are noted with interstitial prominence. Appearance slightly improved since prior study. Heart is normal size. No visible effusions. No acute bony abnormality.  IMPRESSION: Slight interval improvement in patchy bilateral airspace opacities and diffuse interstitial prominence.   Electronically Signed   By: Charlett Nose M.D.   On: 10/18/2013 08:16      ASSESSMENT / PLAN:  37 yo female smoker with recurrent GGO pulmonary infiltrates.  She has hx of H1N1 influenza in January 2014.  Current radiographic findings could be related to viral pneumonitis.  Concern that she has this twice within one year >> clinically improving.   Has mild elevation in SS-A Ab >> doubt this has significance. Plan: -f/u ANCA as outpt -f/u CXR as outpt -defer empiric steroids, biopsy for now since she has clinical improvement -Need to assess for temporary home oxygen set up -can d/c levaquin, tamiflu -she needs to stop smoking  Have scheduled follow up visit with Dr. Sherene Sires for Monday, December 29 at 9:45 AM.  Molli Knock for d/c home once she is set up with  home oxygen.  Coralyn Helling, MD [facility] Pulmonary/Critical Care 10/19/2013, 9:27 AM Pager:  [phone] After 3pm call: [phone]

## 2013-10-19 NOTE — Progress Notes (Signed)
SATURATION QUALIFICATIONS: (This note is used to comply with regulatory documentation for home oxygen)  Patient Saturations on Room Air at Rest = 92%  Patient Saturations on Room Air while Ambulating = 90-92%  Patient Saturation upon returning to room on Room Air= 97%  Please briefly explain why patient needs home oxygen: Patient does not need oxygen at home

## 2013-10-19 NOTE — Discharge Summary (Addendum)
PATIENT DETAILS Name: Wendy Douglas Age: 37 y.o. Sex: female Date of Birth: Mar 29, 1976 MRN: [id number]. Admit Date: 10/14/2013 Admitting Physician: Clydia Llano, MD [id number], Provider, MD  Recommendations for Outpatient Follow-up:  1. Needs to stop smoking 2. We need regular chest x-ray as an outpatient 3. Follow up ANCA as outpt  PRIMARY DISCHARGE DIAGNOSIS:  Principal Problem:   Suspected Viral Pneumonitis Active Problems:   Chronic narcotic dependence   Smoker   Depression     PAST MEDICAL HISTORY: Past Medical History  Diagnosis Date  . Depression   . Smoker   . Chronic narcotic dependence   . COPD (chronic obstructive pulmonary disease)   . Shortness of breath   . Asthma     IN CHILDHOOD    DISCHARGE MEDICATIONS:   Medication List         albuterol 108 (90 BASE) MCG/ACT inhaler  Commonly known as:  PROVENTIL HFA;VENTOLIN HFA  Inhale 2 puffs into the lungs every 6 (six) hours as needed for wheezing.     albuterol (5 MG/ML) 0.5% nebulizer solution  Commonly known as:  PROVENTIL  Take 0.5 mLs (2.5 mg total) by nebulization 3 (three) times daily.     ARIPiprazole 2 MG tablet  Commonly known as:  ABILIFY  Take 2 mg by mouth daily.     citalopram 40 MG tablet  Commonly known as:  CELEXA  Take 40 mg by mouth daily.     gabapentin 400 MG capsule  Commonly known as:  NEURONTIN  Take 400 mg by mouth 4 (four) times daily.     guaiFENesin 600 MG 12 hr tablet  Commonly known as:  MUCINEX  Take 2 tablets (1,200 mg total) by mouth 2 (two) times daily.     hydrOXYzine 25 MG capsule  Commonly known as:  VISTARIL  Take 25 mg by mouth every 6 (six) hours as needed for anxiety.     ipratropium 0.02 % nebulizer solution  Commonly known as:  ATROVENT  Take 2.5 mLs (0.5 mg total) by nebulization 3 (three) times daily.     methadone 10 MG/ML solution  Commonly known as:  DOLOPHINE  Take 135 mg by mouth daily.     multivitamin with minerals Tabs tablet   Take 1 tablet by mouth daily.     nicotine 14 mg/24hr patch  Commonly known as:  NICODERM CQ - dosed in mg/24 hours  Place 1 patch (14 mg total) onto the skin daily.        ALLERGIES:   Allergies  Allergen Reactions  . Bee Venom Shortness Of Breath and Swelling  . Sulfa Antibiotics Rash    BRIEF HPI:  See H&P, Labs, Consult and Test reports for all details in brief, patient is a 37 year old female with a history of chronic methadone use, depression, prior history of influenza in January of 2014 brought in for shortness of breath. Chest x-ray showed widespread interstitial infiltrates.  CONSULTATIONS:   pulmonary/intensive care  PERTINENT RADIOLOGIC STUDIES: Dg Chest 2 View  10/18/2013   CLINICAL DATA:  Shortness of breath, cough, pneumonia.  EXAM: CHEST  2 VIEW  COMPARISON:  10/16/2013  FINDINGS: Patchy bilateral alveolar opacities are noted with interstitial prominence. Appearance slightly improved since prior study. Heart is normal size. No visible effusions. No acute bony abnormality.  IMPRESSION: Slight interval improvement in patchy bilateral airspace opacities and diffuse interstitial prominence.   Electronically Signed   By: Charlett Nose M.D.   On: 10/18/2013 08:16   Dg Chest  2 View  10/16/2013   CLINICAL DATA:  Cough, shortness of breath, chest congestion diffuse findings on previous chest radiograph  EXAM: CHEST  2 VIEW  COMPARISON:  10/14/2013  FINDINGS: Diffuse interstitial and airspace infiltrates are once again appreciated unchanged from prior study. No new focal regions of consolidation are appreciated. Cardiac silhouette and osseous structures are unremarkable.  IMPRESSION: Persistent bilateral mixed interstitial and airspace infiltrates. Saint differential considerations as described previously. Continued surveillance evaluation recommended.   Electronically Signed   By: Salome Holmes M.D.   On: 10/16/2013 08:33   Dg Chest 2 View  10/14/2013   CLINICAL DATA:   Shortness of breath and cough  EXAM: CHEST  2 VIEW  COMPARISON:  May 16, 2013  FINDINGS: There is widespread interstitial and patchy alveolar opacity throughout the lungs bilaterally. The heart size and pulmonary vascularity are normal. No adenopathy. No bone lesions. No pneumothorax.  IMPRESSION: Widespread interstitial and alveolar opacity bilaterally, a finding representing a dramatic change from prior study. Differential considerations include diffuse atypical infection, noncardiogenic edema, or toxic/ allergic type response. Collagen vascular disease is a less common cause for appearance of this nature.  No apparent adenopathy.  Heart size normal.   Electronically Signed   By: Bretta Bang M.D.   On: 10/14/2013 10:40   Ct Angio Chest Pe W/cm &/or Wo Cm  10/16/2013   CLINICAL DATA:  Pulmonary embolism.  Decreasing oxygen saturations.  EXAM: CT ANGIOGRAPHY CHEST WITH CONTRAST  TECHNIQUE: Multidetector CT imaging of the chest was performed using the standard protocol during bolus administration of intravenous contrast. Multiplanar CT image reconstructions including MIPs were obtained to evaluate the vascular anatomy.  CONTRAST:  60mL OMNIPAQUE IOHEXOL 350 MG/ML SOLN  COMPARISON:  CT chest 11/15/2012. Chest radiographs dating back to 11/15/2012.  FINDINGS: Technically adequate study without pulmonary embolism. 3 vessel aortic arch appears within normal limits. There is no axillary adenopathy. Residual thymic tissue is present in the anterior mediastinum which appears similar to the prior exam. There is no pericardial or pleural effusion. Incidental imaging of the upper abdomen is within normal limits. No mediastinal adenopathy.  The lung windows demonstrate diffuse geographic ground-glass attenuation. There is relative sparing of the subpleural surface. No areas of consolidation to suggest pneumonia. The central airways are patent.  Review of the MIP images confirms the above findings.  IMPRESSION: 1.  Negative for pulmonary embolus or acute aortic abnormality. 2. Geographic diffuse ground-glass attenuation in the lungs with subpleural sparing. The appearance is similar to the prior exam of 11/15/2012 with more severe involvement. There is an intervening normal chest radiograph July 2014. 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   Electronically Signed   By: Andreas Newport M.D.   On: 10/16/2013 16:33     PERTINENT LAB RESULTS: CBC: No results found for this basename: WBC, HGB, HCT, PLT,  in the last 72 hours CMET CMP     Component Value Date/Time   NA 136 10/18/2013 0509   K 4.7 10/18/2013 0509   CL 91* 10/18/2013 0509   CO2 32 10/18/2013 0509   GLUCOSE 83 10/18/2013 0509   BUN 13 10/18/2013 0509   CREATININE 0.68 10/18/2013 0509   CALCIUM 10.1 10/18/2013 0509   PROT 7.7 10/14/2013 1200   ALBUMIN 3.1* 10/14/2013 1200   AST 47* 10/14/2013 1200   ALT 12 10/14/2013 1200   ALKPHOS 104 10/14/2013 1200   BILITOT 0.4 10/14/2013 1200   GFRNONAA >90 10/18/2013 0509   GFRAA >  90 10/18/2013 0509    GFR Estimated Creatinine Clearance: 99.6 ml/min (by C-G formula based on Cr of 0.68). No results found for this basename: LIPASE, AMYLASE,  in the last 72 hours No results found for this basename: CKTOTAL, CKMB, CKMBINDEX, TROPONINI,  in the last 72 hours No components found with this basename: POCBNP,  No results found for this basename: DDIMER,  in the last 72 hours No results found for this basename: HGBA1C,  in the last 72 hours No results found for this basename: CHOL, HDL, LDLCALC, TRIG, CHOLHDL, LDLDIRECT,  in the last 72 hours No results found for this basename: TSH, T4TOTAL, FREET3, T3FREE, THYROIDAB,  in the last 72 hours No results found for this basename: VITAMINB12, FOLATE, FERRITIN, TIBC, IRON, RETICCTPCT,  in the last 72 hours Coags: No results found for this basename: PT, INR,  in the last 72 hours Microbiology: Recent Results (from the  past 240 hour(s))  CULTURE, BLOOD (ROUTINE X 2)     Status: None   Collection Time    10/14/13  6:46 PM      Result Value Range Status   Specimen Description BLOOD ARM LEFT   Final   Special Requests BOTTLES DRAWN AEROBIC ONLY 5CC   Final   Culture  Setup Time     Final   Value: 10/15/2013 06:33     Performed at [facility]   Culture     Final   Value:        BLOOD CULTURE RECEIVED NO GROWTH TO DATE CULTURE WILL BE HELD FOR 5 DAYS BEFORE ISSUING A FINAL NEGATIVE REPORT     Performed at [facility]   Report Status PENDING   Incomplete  CULTURE, BLOOD (ROUTINE X 2)     Status: None   Collection Time    10/14/13  6:51 PM      Result Value Range Status   Specimen Description BLOOD HAND LEFT   Final   Special Requests BOTTLES DRAWN AEROBIC ONLY 5CC   Final   Culture  Setup Time     Final   Value: 10/15/2013 07:50     Performed at [facility]   Culture     Final   Value:        BLOOD CULTURE RECEIVED NO GROWTH TO DATE CULTURE WILL BE HELD FOR 5 DAYS BEFORE ISSUING A FINAL NEGATIVE REPORT     Performed at [facility]   Report Status PENDING   Incomplete  CULTURE, EXPECTORATED SPUTUM-ASSESSMENT     Status: None   Collection Time    10/16/13  7:01 AM      Result Value Range Status   Specimen Description SPUTUM   Final   Special Requests NONE   Final   Sputum evaluation     Final   Value: MICROSCOPIC FINDINGS SUGGEST THAT THIS SPECIMEN IS NOT REPRESENTATIVE OF LOWER RESPIRATORY SECRETIONS. PLEASE RECOLLECT.     CALLED TO WESSELINK,JESSE RN 10/16/13 0945 WOOTEN,K   Report Status 10/16/2013 FINAL   Final  RESPIRATORY VIRUS PANEL     Status: None   Collection Time    10/16/13  6:37 PM      Result Value Range Status   Source - [facility]AN NASAL SWAB   Corrected   Comment: CORRECTED ON 12/16 AT 1834: PREVIOUSLY REPORTED AS NASAL SWAB   Respiratory Syncytial Virus A NOT DETECTED   Final   Respiratory Syncytial Virus B NOT DETECTED   Final   Influenza  A NOT  DETECTED   Final   Influenza B NOT DETECTED   Final   Parainfluenza 1 NOT DETECTED   Final   Parainfluenza 2 NOT DETECTED   Final   Parainfluenza 3 NOT DETECTED   Final   Metapneumovirus NOT DETECTED   Final   Rhinovirus NOT DETECTED   Final   Adenovirus NOT DETECTED   Final   Influenza A H1 NOT DETECTED   Final   Influenza A H3 NOT DETECTED   Final   Comment: (NOTE)           Normal Reference Range for each Analyte: NOT DETECTED     Testing performed using the Lumine[employer] xTAG Respiratory Viral Panel test     kit.     This test was developed and its performance characteristics determined     by [facility]. It has not been cleared or approved by the [place]     Food and Drug Administration. This test is used for clinical purposes.     It should not be regarded as investigational or for research. This     laboratory is certified under the Clinical Laboratory Improvement     Amendments of 1988 (CLIA) as qualified to perform high complexity     clinical laboratory testing.     Performed at [facility]  CULTURE, EXPECTORATED SPUTUM-ASSESSMENT     Status: None   Collection Time    10/17/13  5:40 AM      Result Value Range Status   Specimen Description SPUTUM   Final   Special Requests NONE   Final   Sputum evaluation     Final   Value: THIS SPECIMEN IS ACCEPTABLE. RESPIRATORY CULTURE REPORT TO FOLLOW.   Report Status 10/17/2013 FINAL   Final  CULTURE, RESPIRATORY (NON-EXPECTORATED)     Status: None   Collection Time    10/17/13  5:40 AM      Result Value Range Status   Specimen Description SPUTUM   Final   Special Requests NONE   Final   Gram Stain     Final   Value: FEW WBC PRESENT,BOTH PMN AND MONONUCLEAR     RARE SQUAMOUS EPITHELIAL CELLS PRESENT     NO ORGANISMS SEEN     Performed at [facility]   Culture     Final   Value: NORMAL OROPHARYNGEAL FLORA     Performed at [facility]   Report Status 10/19/2013 FINAL   Final     BRIEF HOSPITAL  COURSE:   Principal Problem: ?Viral pneumonitis - Patient was admitted, started on enteric Tamiflu and Levaquin. She was also given oxygen and nebulized bronchodilators. Pulmonology was consulted. CT angiogram of the chest showed diffuse b/l GGO with relative sparing of sub-pleura, there was no pulmonary embolism. Autoimmune workup was negative, ANCA still pending at the day of discharge. She will discharged with home O2, nebulized bronchodilators. Since her respiratory viral panel was negative for influenza, Tamiflu for discharge on the day of discharge. Have spoken with Dr. Craige Cotta from the pulmonology service, he recommends to discontinue Levaquin on discharge as well. - Followup appointment with Dr. Sherene Sires has been arranged for further continued care and treatment.  Active Problems:  Acute hypoxic respiratory failure  - Secondary to above, likely would require oxygen on discharge.    Chronic narcotic dependence - Continue methadone    Smoker - Counseled regarding the importance of smoking. We'll provide transdermal nicotine on discharge to  Depression  - Continue with her preadmission medication   TODAY-DAY OF DISCHARGE:  Subjective:   Wendy Douglas today has no headache,no chest abdominal pain,no new weakness tingling or numbness, feels much better wants to go home today.   Objective:   Blood pressure 105/70, pulse 68, temperature 97.6 F (36.4 C), temperature source Oral, resp. rate 18, height 5\' 9"  (1.753 m), weight 70.9 kg (156 lb 4.9 oz), last menstrual period 10/18/2013, SpO2 97.00%.  Intake/Output Summary (Last 24 hours) at 10/19/13 0958 Last data filed at 10/19/13 0300  Gross per 24 hour  Intake   1560 ml  Output   3451 ml  Net  -1891 ml   Filed Weights   10/14/13 1730 10/15/13 0739  Weight: 74.98 kg (165 lb 4.8 oz) 70.9 kg (156 lb 4.9 oz)    Exam Awake Alert, Oriented *3, No new F.N deficits, Normal affect [place].AT,PERRAL Supple Neck,No JVD, No cervical  lymphadenopathy appriciated.  Symmetrical Chest wall movement, Good air movement bilaterally, CTAB RRR,No Gallops,Rubs or new Murmurs, No Parasternal Heave +ve B.Sounds, Abd Soft, Non tender, No organomegaly appriciated, No rebound -guarding or rigidity. No Cyanosis, Clubbing or edema, No new Rash or bruise  DISCHARGE CONDITION: Stable  DISPOSITION: Home  DISCHARGE INSTRUCTIONS:    Activity:  As tolerated   Diet recommendation: Regular Diet       Discharge Orders   Future Appointments Provider Department Dept Phone   10/31/2013 9:45 AM Nyoka Cowden, MD [facility] [phone]   11/21/2013 11:00 AM [employer] Counselor [facility] [phone]   11/21/2013 11:30 AM Doris Cheadle, MD [facility] [phone]   Future Orders Complete By Expires   Call MD for:  difficulty breathing, headache or visual disturbances  As directed    Diet general  As directed    Increase activity slowly  As directed       Follow-up Information   Follow up with [facility]     On 11/21/2013. (orange card eligibility apt at 11 am and hospital follow up at 11:30 am)    Contact information:   [address] [place] [address] [phone]      Follow up with Sandrea Hughs, MD On 10/31/2013. (9:45 AM)    Specialty:  Pulmonary Disease   Contact information:   [address] [place] [address] [phone]      Total Time spent on discharge equals 45 minutes.  SignedJeoffrey Massed 10/19/2013 9:58 AM

## 2013-10-19 NOTE — Progress Notes (Signed)
Nsg Discharge Note  Admit Date:  10/14/2013 Discharge date: 10/19/2013   Jazmyne Beauchesne to be D/C'd Home per MD order.  AVS completed.  Copy for chart, and copy for patient signed, and dated. Patient/caregiver able to verbalize understanding.  Discharge Medication:   Medication List         albuterol 108 (90 BASE) MCG/ACT inhaler  Commonly known as:  PROVENTIL HFA;VENTOLIN HFA  Inhale 2 puffs into the lungs every 6 (six) hours as needed for wheezing.     albuterol (5 MG/ML) 0.5% nebulizer solution  Commonly known as:  PROVENTIL  Take 0.5 mLs (2.5 mg total) by nebulization 3 (three) times daily.     ARIPiprazole 2 MG tablet  Commonly known as:  ABILIFY  Take 2 mg by mouth daily.     citalopram 40 MG tablet  Commonly known as:  CELEXA  Take 40 mg by mouth daily.     gabapentin 400 MG capsule  Commonly known as:  NEURONTIN  Take 400 mg by mouth 4 (four) times daily.     guaiFENesin 600 MG 12 hr tablet  Commonly known as:  MUCINEX  Take 2 tablets (1,200 mg total) by mouth 2 (two) times daily.     hydrOXYzine 25 MG capsule  Commonly known as:  VISTARIL  Take 25 mg by mouth every 6 (six) hours as needed for anxiety.     ipratropium 0.02 % nebulizer solution  Commonly known as:  ATROVENT  Take 2.5 mLs (0.5 mg total) by nebulization 3 (three) times daily.     methadone 10 MG/ML solution  Commonly known as:  DOLOPHINE  Take 135 mg by mouth daily.     multivitamin with minerals Tabs tablet  Take 1 tablet by mouth daily.     nicotine 14 mg/24hr patch  Commonly known as:  NICODERM CQ - dosed in mg/24 hours  Place 1 patch (14 mg total) onto the skin daily.        Discharge Assessment: Filed Vitals:   10/19/13 0602  BP: 105/70  Pulse: 68  Temp: 97.6 F (36.4 C)  Resp: 18   Skin clean and dry patient has skin tear on right wrist with foam dressing. IV catheter discontinued intact. Site without signs and symptoms of complications - no redness or edema noted at  insertion site, patient denies c/o pain - only slight tenderness at site.  Dressing with slight pressure applied.  D/c Instructions-Education: Discharge instructions given to patient/family with verbalized understanding. D/c education completed with patient/family including follow up instructions, medication list, d/c activities limitations if indicated, with other d/c instructions as indicated by MD - patient able to verbalize understanding, all questions fully answered. Patient instructed to return to ED, call 911, or call MD for any changes in condition.  Patient escorted via WC, and D/C home via private auto.  Tayquan Gassman, Brooke Bonito, RN 10/19/2013 12:23 PM

## 2013-10-21 LAB — CULTURE, BLOOD (ROUTINE X 2): Culture: NO GROWTH

## 2013-10-31 ENCOUNTER — Inpatient Hospital Stay: Payer: Self-pay | Admitting: Internal Medicine

## 2013-11-21 ENCOUNTER — Ambulatory Visit: Payer: Self-pay

## 2013-11-21 ENCOUNTER — Inpatient Hospital Stay: Payer: Self-pay | Admitting: Internal Medicine

## 2014-01-15 IMAGING — CT CT ANGIO CHEST
2 of 8 series · 18 of 46 positions shown · IV contrast (omnipaque)
Comparison: CT chest 11/15/2012. Chest radiographs dating back to
11/15/2012.

CLINICAL DATA: Pulmonary embolism.  Decreasing oxygen saturations.

EXAM:
CT ANGIOGRAPHY CHEST WITH CONTRAST
TECHNIQUE: Multidetector CT imaging of the chest was performed using the
standard protocol during bolus administration of intravenous
contrast. Multiplanar CT image reconstructions including MIPs were
obtained to evaluate the vascular anatomy.
CONTRAST:  60mL OMNIPAQUE IOHEXOL 350 MG/ML SOLN

[Series 5: thins · axial · 0.58mm/px · z∈[-679,-461]mm · 15 of 240 slices shown]
[im 11/240  lung]
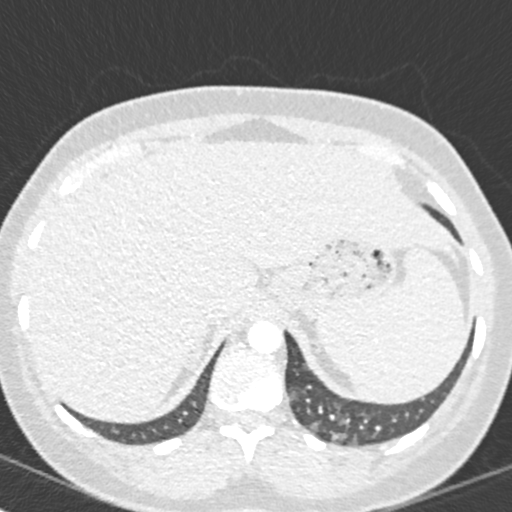
[im 33/240  soft-tissue]
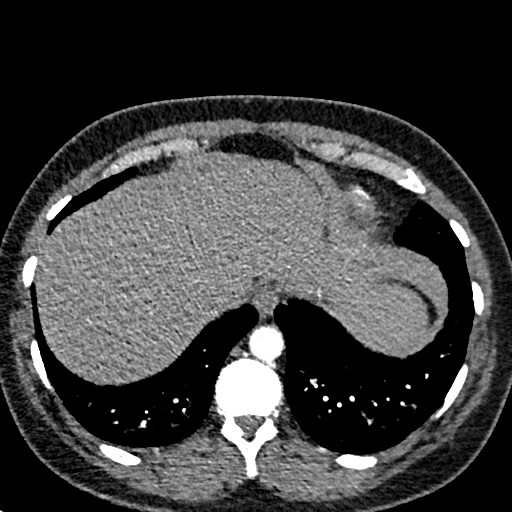
[im 44/240  lung]
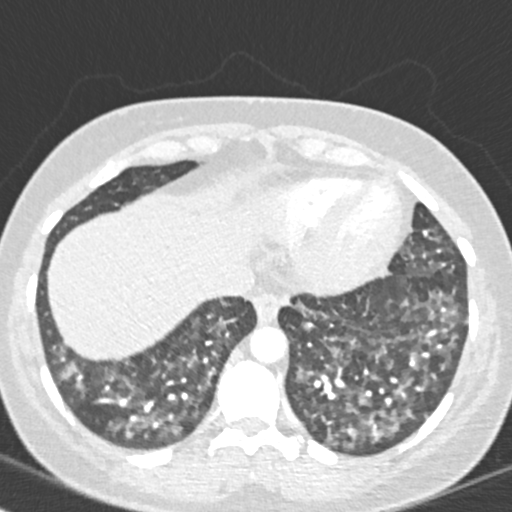
[im 55/240  soft-tissue]
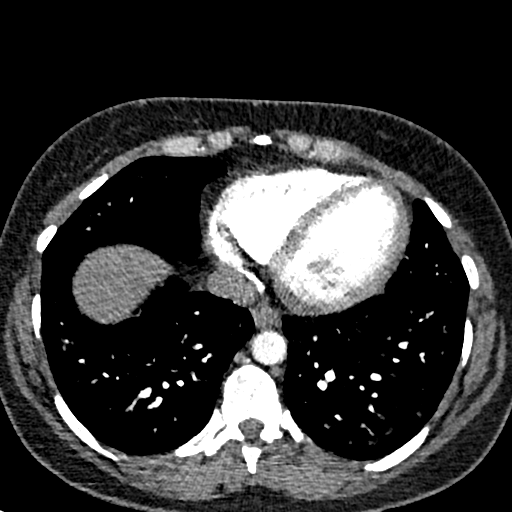
[im 77/240  lung]
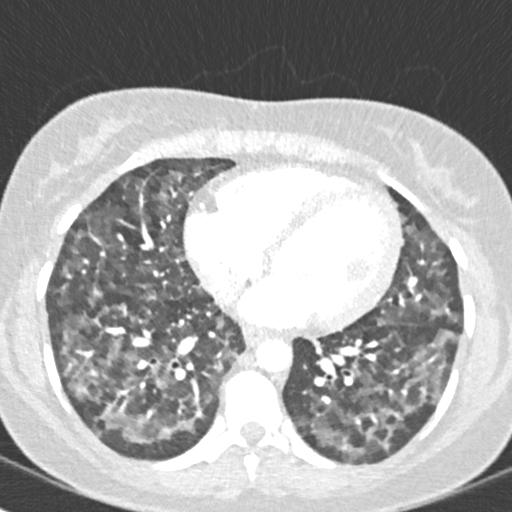
[im 87/240  soft-tissue]
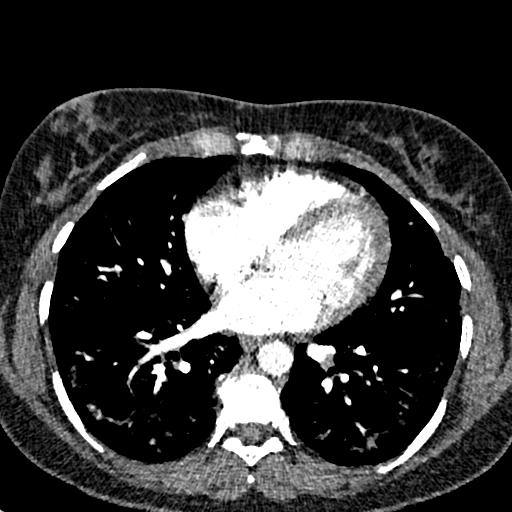
[im 109/240  lung]
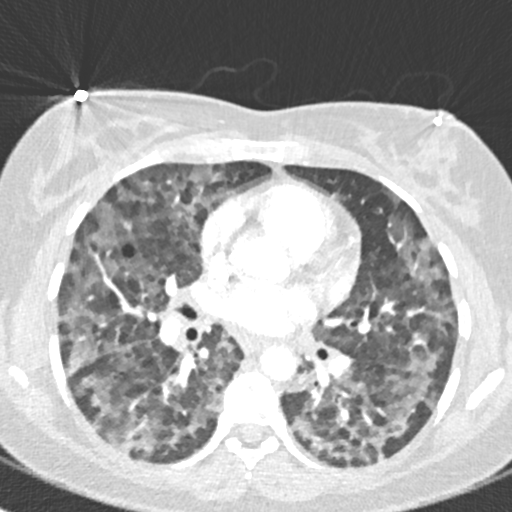
[im 120/240  soft-tissue]
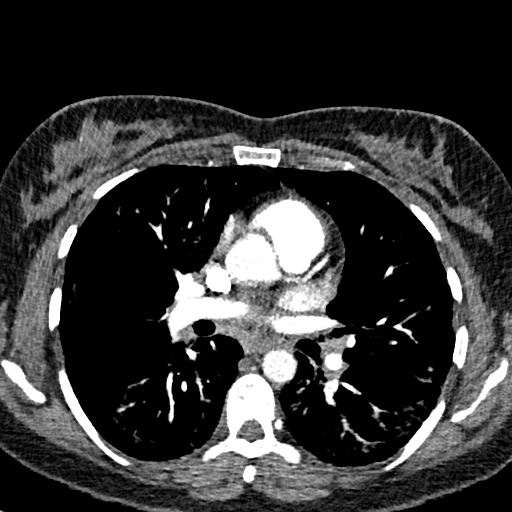
[im 131/240  lung]
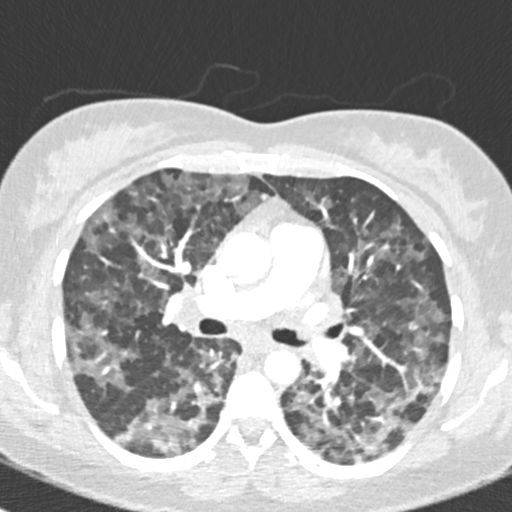
[im 153/240  soft-tissue]
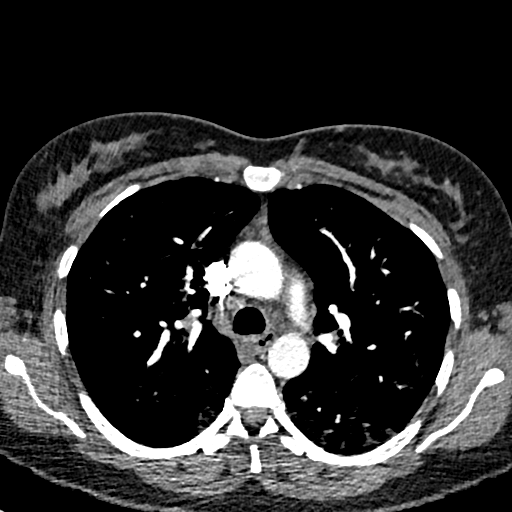
[im 163/240  lung]
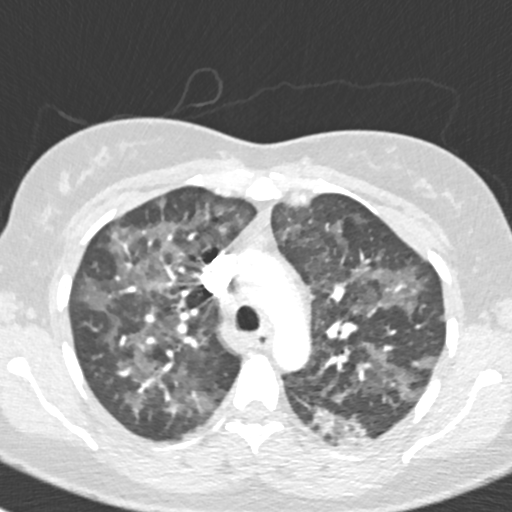
[im 185/240  soft-tissue]
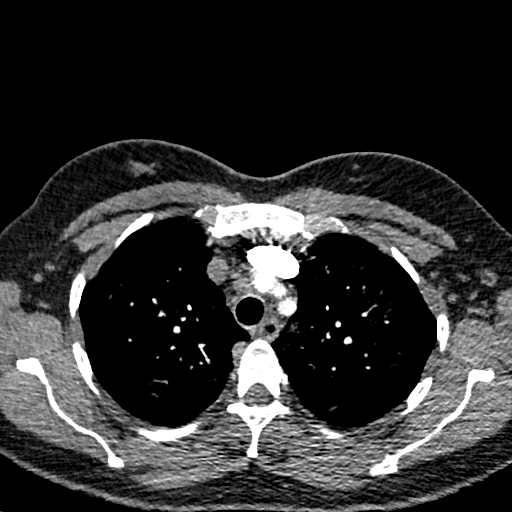
[im 196/240  lung]
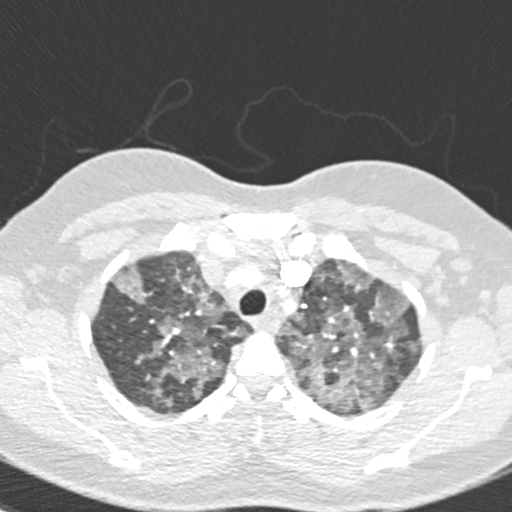
[im 207/240  soft-tissue]
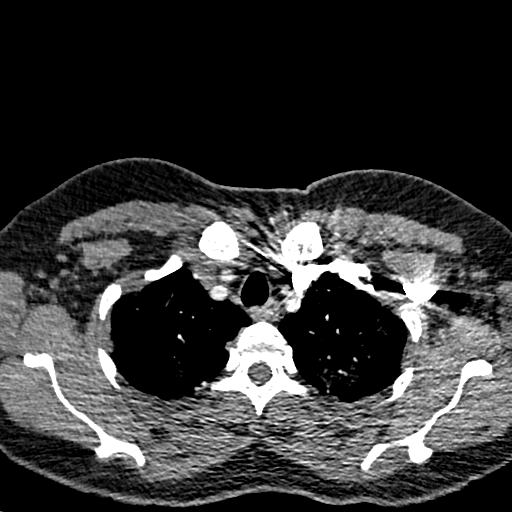
[im 229/240  lung]
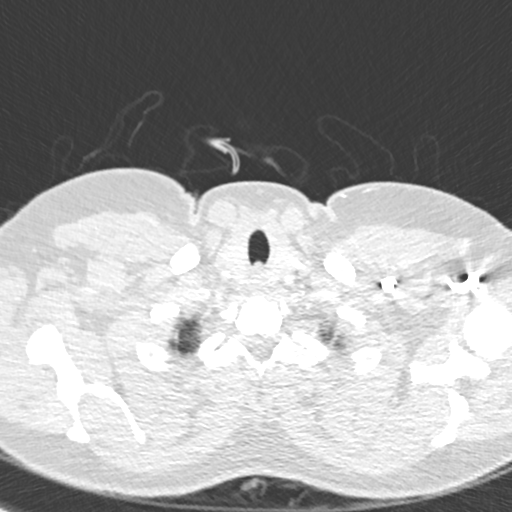

[Series 7: coronal mpr · coronal · 0.49mm/px · 3 of 104 slices shown]
[im 26/104  soft-tissue]
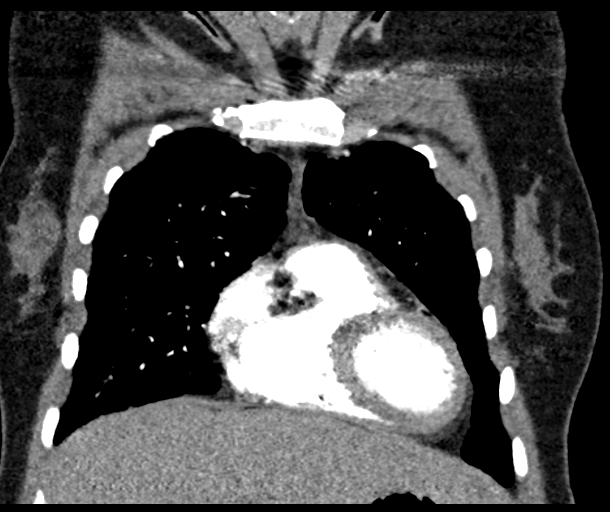
[im 52/104  soft-tissue]
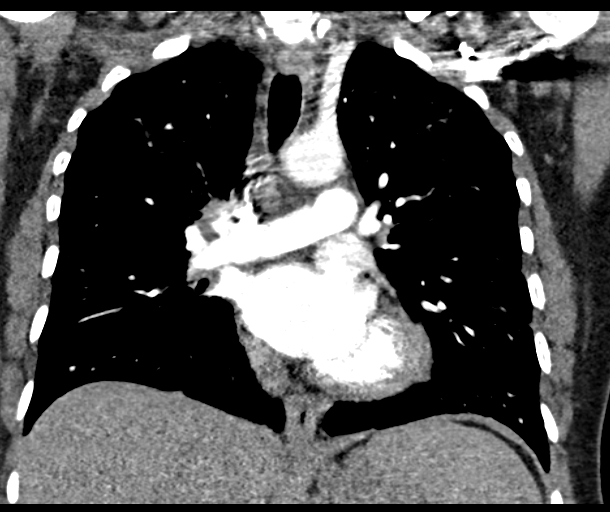
[im 78/104  soft-tissue]
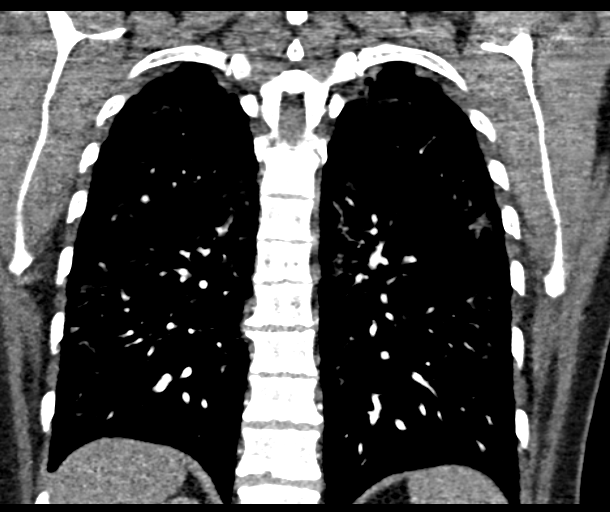

[18 of 46 positions shown; findings below may reference images not displayed]

FINDINGS: Technically adequate study without pulmonary embolism. 3 vessel
aortic arch appears within normal limits. There is no axillary
adenopathy. Residual thymic tissue is present in the anterior
mediastinum which appears similar to the prior exam. There is no
pericardial or pleural effusion. Incidental imaging of the upper
abdomen is within normal limits. No mediastinal adenopathy.

The lung windows demonstrate diffuse geographic ground-glass
attenuation. There is relative sparing of the subpleural surface. No
areas of consolidation to suggest pneumonia. The central airways are
patent.

Review of the MIP images confirms the above findings.
IMPRESSION: 1. Negative for pulmonary embolus or acute aortic abnormality.
2. Geographic diffuse ground-glass attenuation in the lungs with
subpleural sparing. The appearance is similar to the prior exam of
11/15/2012 with more severe involvement. There is an intervening
normal chest radiograph May 2013. The findings are most consistent
with nonspecific interstitial pneumonitis.

## 2014-01-15 IMAGING — CR DG CHEST 2V
2 series · 2 of 2 positions shown · non-contrast
Comparison: 10/14/2013

CLINICAL DATA: Cough, shortness of breath, chest congestion diffuse
findings on previous chest radiograph

EXAM:
CHEST  2 VIEW

[w chest pa]
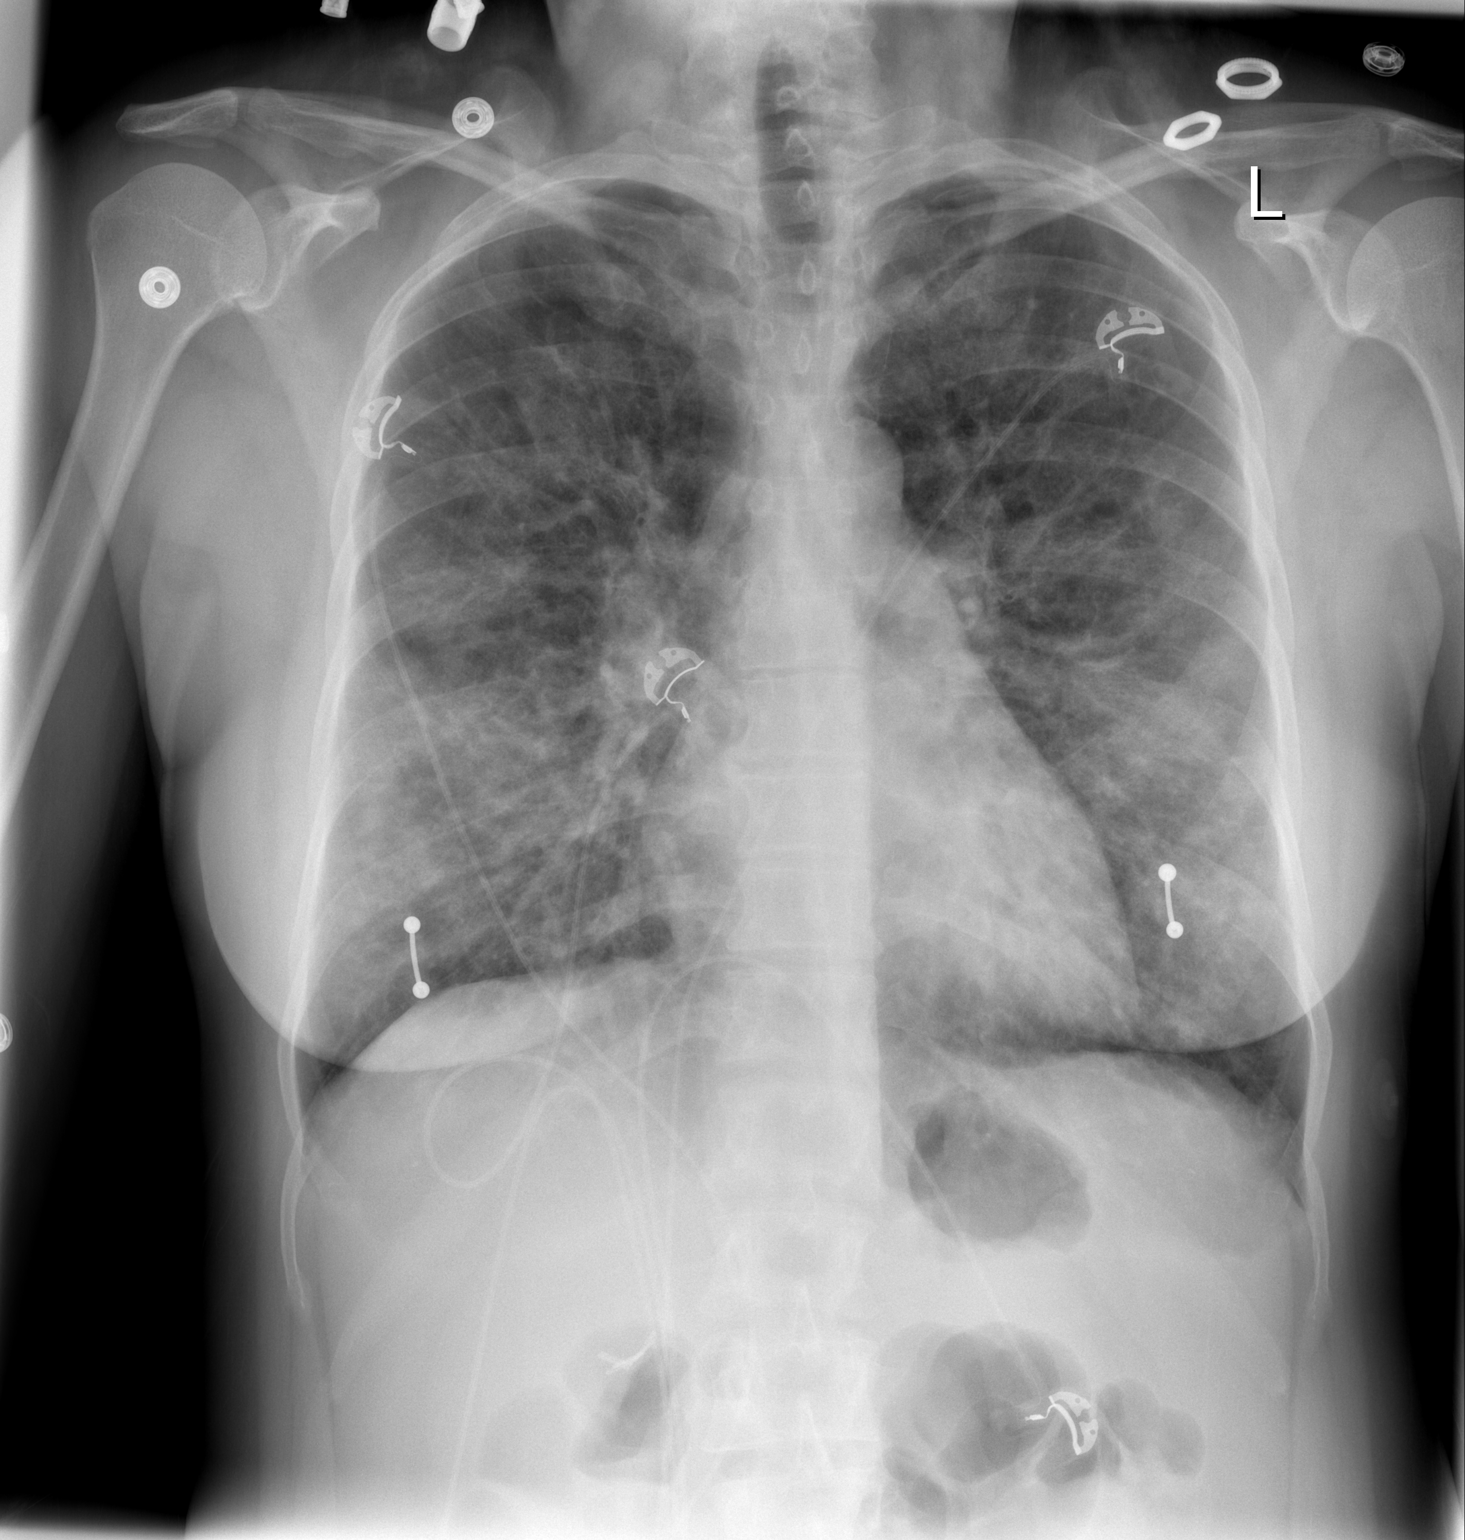

[w chest lat]
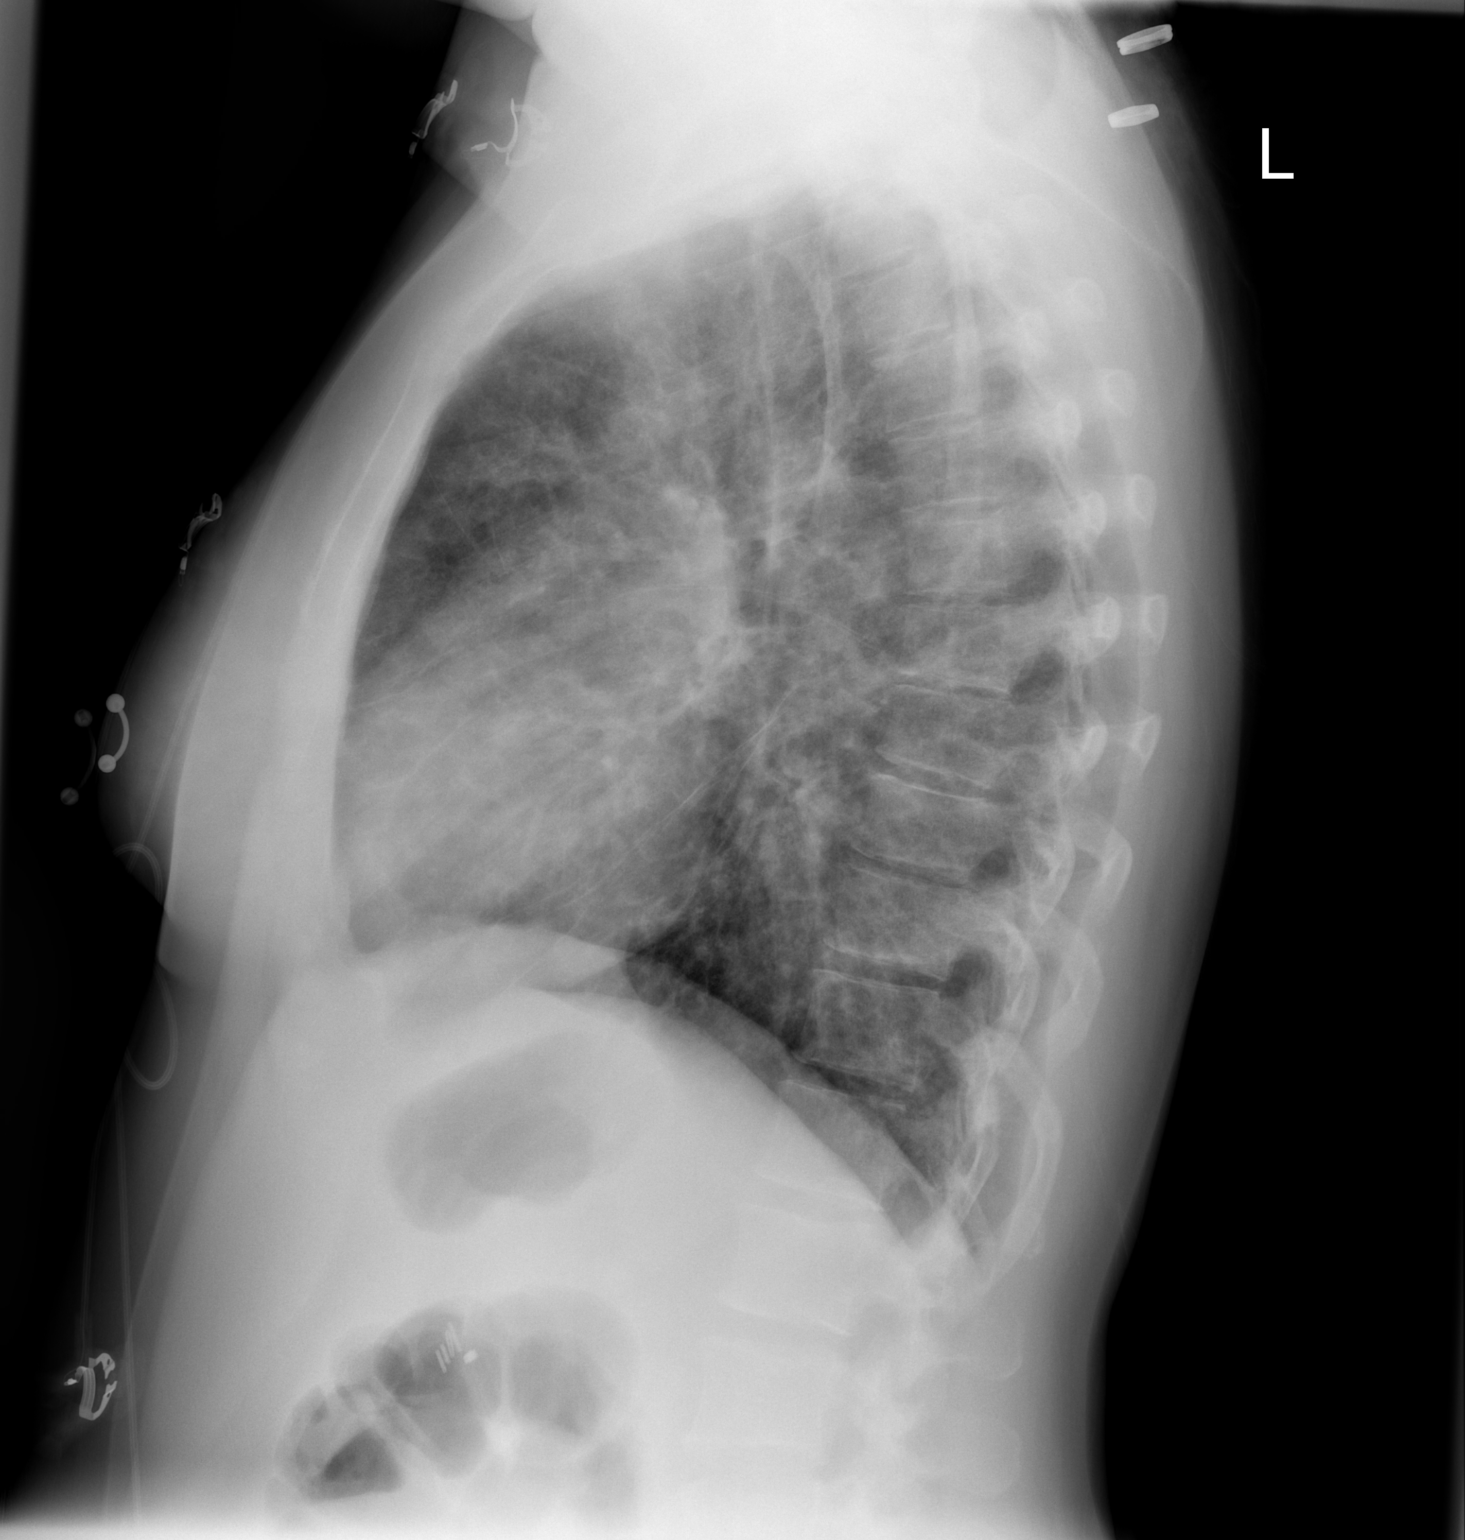

[2 of 2 positions shown; findings below may reference images not displayed]

FINDINGS: Diffuse interstitial and airspace infiltrates are once again
appreciated unchanged from prior study. No new focal regions of
consolidation are appreciated. Cardiac silhouette and osseous
structures are unremarkable.
IMPRESSION: Persistent bilateral mixed interstitial and airspace infiltrates.
Vilen differential considerations as described previously. Continued
surveillance evaluation recommended.

## 2014-06-29 ENCOUNTER — Emergency Department (HOSPITAL_COMMUNITY)
Admission: EM | Admit: 2014-06-29 | Discharge: 2014-06-29 | Disposition: A | Discharge status: Home / Self Care | Payer: Self-pay | Attending: Emergency Medicine | Admitting: Emergency Medicine

## 2014-06-29 ENCOUNTER — Encounter (HOSPITAL_COMMUNITY): Payer: Self-pay | Admitting: Emergency Medicine

## 2014-06-29 ENCOUNTER — Emergency Department (HOSPITAL_COMMUNITY): Payer: MEDICAID

## 2014-06-29 DIAGNOSIS — B9789 Other viral agents as the cause of diseases classified elsewhere: Secondary | ICD-10-CM

## 2014-06-29 DIAGNOSIS — F3289 Other specified depressive episodes: Secondary | ICD-10-CM | POA: Insufficient documentation

## 2014-06-29 DIAGNOSIS — J45901 Unspecified asthma with (acute) exacerbation: Secondary | ICD-10-CM

## 2014-06-29 DIAGNOSIS — R0602 Shortness of breath: Secondary | ICD-10-CM | POA: Insufficient documentation

## 2014-06-29 DIAGNOSIS — J988 Other specified respiratory disorders: Secondary | ICD-10-CM

## 2014-06-29 DIAGNOSIS — F329 Major depressive disorder, single episode, unspecified: Secondary | ICD-10-CM | POA: Insufficient documentation

## 2014-06-29 DIAGNOSIS — Z87891 Personal history of nicotine dependence: Secondary | ICD-10-CM | POA: Insufficient documentation

## 2014-06-29 DIAGNOSIS — J069 Acute upper respiratory infection, unspecified: Secondary | ICD-10-CM | POA: Insufficient documentation

## 2014-06-29 DIAGNOSIS — Z79899 Other long term (current) drug therapy: Secondary | ICD-10-CM | POA: Insufficient documentation

## 2014-06-29 DIAGNOSIS — J441 Chronic obstructive pulmonary disease with (acute) exacerbation: Secondary | ICD-10-CM | POA: Insufficient documentation

## 2014-06-29 LAB — CBC
HEMATOCRIT: 42 % (ref 36.0–46.0)
Hemoglobin: 14.6 g/dL (ref 12.0–15.0)
MCH: 32.7 pg (ref 26.0–34.0)
MCHC: 34.8 g/dL (ref 30.0–36.0)
MCV: 94 fL (ref 78.0–100.0)
Platelets: 181 10*3/uL (ref 150–400)
RBC: 4.47 MIL/uL (ref 3.87–5.11)
RDW: 13.2 % (ref 11.5–15.5)
WBC: 8.9 10*3/uL (ref 4.0–10.5)

## 2014-06-29 LAB — BASIC METABOLIC PANEL
ANION GAP: 16 — AB (ref 5–15)
BUN: 6 mg/dL (ref 6–23)
CALCIUM: 9.7 mg/dL (ref 8.4–10.5)
CO2: 22 meq/L (ref 19–32)
Chloride: 98 mEq/L (ref 96–112)
Creatinine, Ser: 0.68 mg/dL (ref 0.50–1.10)
GFR calc Af Amer: >90 mL/min (ref >90)
GFR calc non Af Amer: >90 mL/min (ref >90)
GLUCOSE: 125 mg/dL — AB (ref 70–99)
POTASSIUM: 4.3 meq/L (ref 3.7–5.3)
SODIUM: 136 meq/L — AB (ref 137–147)

## 2014-06-29 LAB — I-STAT TROPONIN, ED: Troponin i, poc: 0 ng/mL (ref 0.00–0.08)

## 2014-06-29 MED ORDER — IPRATROPIUM-ALBUTEROL 0.5-2.5 (3) MG/3ML IN SOLN
3.0000 mL | Freq: Once | RESPIRATORY_TRACT | Status: AC
  Administered 2014-06-29: 3 mL via RESPIRATORY_TRACT
  Filled 2014-06-29: qty 3

## 2014-06-29 MED ORDER — ALBUTEROL SULFATE HFA 108 (90 BASE) MCG/ACT IN AERS: 2.0000 | INHALATION_SPRAY | Freq: Four times a day (QID) | RESPIRATORY_TRACT | Status: DC | PRN

## 2014-06-29 MED ORDER — AZITHROMYCIN 250 MG PO TABS
250.0000 mg | ORAL_TABLET | Freq: Every day | ORAL | Status: DC
Start: 2014-06-29 — End: 2014-12-15

## 2014-06-29 MED ORDER — PREDNISONE 50 MG PO TABS: 50.0000 mg | ORAL_TABLET | Freq: Every day | ORAL | Status: DC

## 2014-06-29 MED ORDER — PREDNISONE 20 MG PO TABS
60.0000 mg | ORAL_TABLET | Freq: Once | ORAL | Status: AC
  Administered 2014-06-29: 60 mg via ORAL
  Filled 2014-06-29: qty 3

## 2014-06-29 NOTE — Discharge Instructions (Signed)

## 2014-06-29 NOTE — ED Provider Notes (Signed)
CSN: [id number]     Arrival date & time 06/29/14  1449 History   First MD Initiated Contact with Patient 06/29/14 1754     Chief Complaint  Patient presents with  . Shortness of Breath    Patient is a 38 y.o. female presenting with shortness of breath. The history is provided by the patient.  Shortness of Breath Severity:  Moderate Onset quality:  Gradual Duration:  3 days Timing:  Constant Progression:  Worsening Chronicity:  Recurrent (similar to prior PNA) Context: URI   Relieved by:  Nothing Worsened by:  Deep breathing Ineffective treatments:  None tried Associated symptoms: cough, sputum production (green) and wheezing   Associated symptoms: no abdominal pain, no chest pain, no fever, no headaches, no hemoptysis, no rash, no sore throat and no vomiting   Risk factors: no prolonged immobilization    Ran out of inhaler.  Unsure of hx of COPD, last lung doctor 2 yrs ago said no.   Past Medical History  Diagnosis Date  . Depression   . Smoker   . Chronic narcotic dependence   . COPD (chronic obstructive pulmonary disease)   . Shortness of breath   . Asthma     IN CHILDHOOD   History reviewed. No pertinent past surgical history. Family History  Problem Relation Age of Onset  . Multiple sclerosis Mother   . Other Father     Suicide   History  Substance Use Topics  . Smoking status: Former Smoker    Types: Cigarettes    Quit date: 03/16/2013  . Smokeless tobacco: Never Used     Comment: 1-2 puffs from time to time    uses vapor cigarette  . Alcohol Use: 1.8 oz/week    3 Cans of beer per week     Comment: daily   OB History   Grav Para Term Preterm Abortions TAB SAB Ect Mult Living                 Review of Systems  Constitutional: Negative for fever.  HENT: Negative for sore throat.   Respiratory: Positive for cough, sputum production (green), shortness of breath and wheezing. Negative for hemoptysis.   Cardiovascular: Negative for chest pain.   Gastrointestinal: Negative for vomiting and abdominal pain.  Skin: Negative for rash.  Neurological: Negative for headaches.      Allergies  Bee venom and Sulfa antibiotics  Home Medications   Prior to Admission medications   Medication Sig Start Date End Date Taking? Authorizing Provider  albuterol (PROVENTIL HFA;VENTOLIN HFA) 108 (90 BASE) MCG/ACT inhaler Inhale 2 puffs into the lungs every 6 (six) hours as needed for wheezing. 05/17/13  Yes Rhetta Mura, MD  albuterol (PROVENTIL) (5 MG/ML) 0.5% nebulizer solution Take 0.5 mLs (2.5 mg total) by nebulization 3 (three) times daily. 10/19/13  Yes Shanker Levora Dredge, MD  ARIPiprazole (ABILIFY) 2 MG tablet Take 2 mg by mouth daily.   Yes Historical Provider, MD  citalopram (CELEXA) 40 MG tablet Take 40 mg by mouth daily.   Yes Historical Provider, MD  gabapentin (NEURONTIN) 600 MG tablet Take 600 mg by mouth 2 (two) times daily.   Yes Historical Provider, MD  guaiFENesin (MUCINEX) 600 MG 12 hr tablet Take 1,200 mg by mouth 2 (two) times daily as needed for cough or to loosen phlegm. 10/19/13  Yes Shanker Levora Dredge, MD  hydrOXYzine (VISTARIL) 25 MG capsule Take 25 mg by mouth every 6 (six) hours as needed for anxiety.   Yes  Historical Provider, MD  ipratropium (ATROVENT) 0.02 % nebulizer solution Take 2.5 mLs (0.5 mg total) by nebulization 3 (three) times daily. 10/19/13  Yes Shanker Levora Dredge, MD  methadone (DOLOPHINE) 10 MG/ML solution Take 75 mg by mouth daily.    Yes Historical Provider, MD  Multiple Vitamin (MULTIVITAMIN WITH MINERALS) TABS tablet Take 1 tablet by mouth daily.   Yes Historical Provider, MD   BP 129/70  Pulse 76  Temp(Src) 98.1 F (36.7 C) (Oral)  Resp 18  Ht  (1.676 m)  Wt 145 lb (65.772 kg)  BMI 23.41 kg/m2  SpO2 93%  LMP 05/29/2014 Physical Exam  Nursing note and vitals reviewed. Constitutional: She is oriented to person, place, and time. She appears well-developed and well-nourished. No distress.   Comfortable  HENT:  Head: Normocephalic and atraumatic.  Nose: Nose normal.  Eyes: Conjunctivae are normal.  Neck: Normal range of motion. Neck supple. No tracheal deviation present.  Cardiovascular: Normal rate, regular rhythm and normal heart sounds.   No murmur heard. Pulmonary/Chest: Effort normal. No respiratory distress. She has wheezes (r sided end exp). She has no rales. She exhibits no tenderness.  No tachypnea. Exp phase prolonged.   Abdominal: Soft. Bowel sounds are normal. She exhibits no distension and no mass. There is no tenderness.  Musculoskeletal: Normal range of motion. She exhibits no edema.  Neurological: She is alert and oriented to person, place, and time.  Skin: Skin is warm and dry. No rash noted.  Psychiatric: She has a normal mood and affect.    ED Course  Procedures (including critical care time) Labs Review Labs Reviewed  BASIC METABOLIC PANEL - Abnormal; Notable for the following:    Sodium 136 (*)    Glucose, Bld 125 (*)    Anion gap 16 (*)    All other components within normal limits  CBC  I-STAT TROPOININ, ED    Imaging Review Dg Chest 2 View (if Patient Has Fever And/or Copd)  06/29/2014   CLINICAL DATA:  Mid chest pain.  EXAM: CHEST  2 VIEW  COMPARISON:  Chest radiograph 10/18/2013  FINDINGS: Stable cardiac and mediastinal contours. No consolidative pulmonary opacities. No pleural effusion or pneumothorax. Regional skeleton is unremarkable.  IMPRESSION: No acute cardiopulmonary process.   Electronically Signed   By: Annia Belt M.D.   On: 06/29/2014 17:57     EKG Interpretation None      MDM   Final diagnoses:  Asthma exacerbation  Viral respiratory illness    Hx of asthma, maybe COPD but unlikely who presents for dyspnea, wheeze, myalgias, congestion.  Wheeze on exam.  No sign of resp distress.  No sign of DVT/PE. VSS. Gave duoneb, steroid.  CXR w/o PNA or hyperexpansion. Treat as asthma exacerbation complicated by underlying viral  illness.   Repeat exam after neb x2: Sx improved significantly. VSS, no hypoxia. Lungs without wheeze  D/c home with pred burst and zpack.  Wrote for refill of albuterol.  Strict return precautions for dyspnea, chest pain, syncope.   Sofie Rower, MD 06/29/14 2351

## 2014-06-29 NOTE — ED Notes (Addendum)
Presents with SOB and productive cough with green sputum for 2 days associated with bodyaches and runny nose. Denies pain, denies fevers. Bilateral breath sounds diminished.

## 2014-07-06 NOTE — ED Provider Notes (Signed)
I saw and evaluated the patient, reviewed the resident's note and I agree with the findings and plan.   EKG Interpretation   Date/Time:  Thursday June 29 2014 15:22:01 EDT Ventricular Rate:  78 PR Interval:  132 QRS Duration: 82 QT Interval:  378 QTC Calculation: 430 R Axis:   76 Text Interpretation:  Normal sinus rhythm Nonspecific T wave abnormality  Abnormal ECG ED PHYSICIAN INTERPRETATION AVAILABLE IN [facility]  Confirmed by TEST, Record ([id number]) on 07/01/2014 8:54:06 AM         Patient seen and examined. Discussed with Dr. stable. Exam shows wheezing and prolongation. Improved after first neb. Nearly resolved after second. Given by mouth prednisone. Clear lungs well oxygenated. I agree with disposition.  Rolland Porter, MD 07/06/14 2112

## 2014-12-15 ENCOUNTER — Encounter (HOSPITAL_COMMUNITY): Payer: Self-pay

## 2014-12-15 ENCOUNTER — Emergency Department (HOSPITAL_COMMUNITY): Payer: Self-pay

## 2014-12-15 ENCOUNTER — Emergency Department (HOSPITAL_COMMUNITY)
Admission: EM | Admit: 2014-12-15 | Discharge: 2014-12-15 | Disposition: A | Discharge status: Home / Self Care | Payer: Self-pay | Attending: Emergency Medicine | Admitting: Emergency Medicine

## 2014-12-15 DIAGNOSIS — J441 Chronic obstructive pulmonary disease with (acute) exacerbation: Secondary | ICD-10-CM | POA: Insufficient documentation

## 2014-12-15 DIAGNOSIS — R52 Pain, unspecified: Secondary | ICD-10-CM | POA: Insufficient documentation

## 2014-12-15 DIAGNOSIS — Z792 Long term (current) use of antibiotics: Secondary | ICD-10-CM | POA: Insufficient documentation

## 2014-12-15 DIAGNOSIS — R531 Weakness: Secondary | ICD-10-CM | POA: Insufficient documentation

## 2014-12-15 DIAGNOSIS — F329 Major depressive disorder, single episode, unspecified: Secondary | ICD-10-CM | POA: Insufficient documentation

## 2014-12-15 DIAGNOSIS — Z720 Tobacco use: Secondary | ICD-10-CM | POA: Insufficient documentation

## 2014-12-15 DIAGNOSIS — J208 Acute bronchitis due to other specified organisms: Secondary | ICD-10-CM

## 2014-12-15 DIAGNOSIS — Z79899 Other long term (current) drug therapy: Secondary | ICD-10-CM | POA: Insufficient documentation

## 2014-12-15 DIAGNOSIS — Z7952 Long term (current) use of systemic steroids: Secondary | ICD-10-CM | POA: Insufficient documentation

## 2014-12-15 MED ORDER — ALBUTEROL SULFATE HFA 108 (90 BASE) MCG/ACT IN AERS: 2.0000 | INHALATION_SPRAY | Freq: Four times a day (QID) | RESPIRATORY_TRACT | Status: DC | PRN

## 2014-12-15 MED ORDER — AZITHROMYCIN 250 MG PO TABS: 250.0000 mg | ORAL_TABLET | Freq: Every day | ORAL | Status: DC

## 2014-12-15 MED ORDER — ALBUTEROL SULFATE (5 MG/ML) 0.5% IN NEBU: 2.5000 mg | INHALATION_SOLUTION | Freq: Three times a day (TID) | RESPIRATORY_TRACT | Status: DC

## 2014-12-15 NOTE — ED Provider Notes (Signed)
CSN: [id number]     Arrival date & time 12/15/14  1343 History  This chart was scribed for Wendy Schaumann, PA-C, working with Toy Cookey, MD by Chestine Spore, ED Scribe. The patient was seen in room TR07C/TR07C at 3:18 PM.    Chief Complaint  Patient presents with  . Generalized Body Aches  . Cough     The history is provided by the patient. No language interpreter was used.   HPI Comments: Wendy Douglas is a 39 y.o. female who presents to the Emergency Department complaining of generalized body aches onset 4 days ago. She reports that she was off Wednesday and slept all day. She notes that she left work early yesterday because of fatigue. She got a flu shot in November and pneumonia shot last year. She has had pneumonia and flu before. She states that she is having associated symptoms of cough, fatigue. She denies any other symptoms. She reports that she has had to be hospitalized for the past two winters with pneumonia. She states that she is a smoker. She denies any medical problems. She denies having a PCP. She notes that she has an albuterol inhaler and she uses it PRN. She also notes that she is almost out of her medications.  Pt complains of a cough   Past Medical History  Diagnosis Date  . Depression   . Smoker   . Chronic narcotic dependence   . COPD (chronic obstructive pulmonary disease)   . Shortness of breath   . Asthma     IN CHILDHOOD   Past Surgical History  Procedure Laterality Date  . Tubal ligation    . Cholecystectomy     Family History  Problem Relation Age of Onset  . Multiple sclerosis Mother   . Other Father     Suicide   History  Substance Use Topics  . Smoking status: Current Every Day Smoker    Types: Cigarettes  . Smokeless tobacco: Never Used     Comment: 1-2 puffs from time to time    uses vapor cigarette  . Alcohol Use: No   OB History    No data available     Review of Systems  Constitutional:       Generalized body aches   Respiratory: Positive for cough.   Neurological: Positive for weakness.  All other systems reviewed and are negative.     Allergies  Bee venom and Sulfa antibiotics  Home Medications   Prior to Admission medications   Medication Sig Start Date End Date Taking? Authorizing Provider  albuterol (PROVENTIL HFA;VENTOLIN HFA) 108 (90 BASE) MCG/ACT inhaler Inhale 2 puffs into the lungs every 6 (six) hours as needed for wheezing. 06/29/14  Yes Sofie Rower, MD  albuterol (PROVENTIL) (5 MG/ML) 0.5% nebulizer solution Take 0.5 mLs (2.5 mg total) by nebulization 3 (three) times daily. 10/19/13  Yes Shanker Levora Dredge, MD  ARIPiprazole (ABILIFY) 2 MG tablet Take 2 mg by mouth daily.   Yes Historical Provider, MD  citalopram (CELEXA) 40 MG tablet Take 40 mg by mouth daily.   Yes Historical Provider, MD  gabapentin (NEURONTIN) 600 MG tablet Take 300 mg by mouth 3 (three) times daily.    Yes Historical Provider, MD  hydrOXYzine (VISTARIL) 25 MG capsule Take 25 mg by mouth 3 (three) times daily.    Yes Historical Provider, MD  Iron Combinations (IRON COMPLEX PO) Take 1 tablet by mouth daily.   Yes Historical Provider, MD  methadone (DOLOPHINE) 10 MG/ML solution  Take 94 mg by mouth daily.    Yes Historical Provider, MD  Multiple Vitamin (MULTIVITAMIN WITH MINERALS) TABS tablet Take 1 tablet by mouth daily.   Yes Historical Provider, MD  azithromycin (ZITHROMAX Z-PAK) 250 MG tablet Take 1 tablet (250 mg total) by mouth daily. Take 2 first day, then 1 for 4 days Patient not taking: Reported on 12/15/2014 06/29/14   Sofie RowerMike Stengel, MD  ipratropium (ATROVENT) 0.02 % nebulizer solution Take 2.5 mLs (0.5 mg total) by nebulization 3 (three) times daily. Patient not taking: Reported on 12/15/2014 10/19/13   Maretta BeesShanker M Ghimire, MD  predniSONE (DELTASONE) 50 MG tablet Take 1 tablet (50 mg total) by mouth daily with breakfast. Patient not taking: Reported on 12/15/2014 06/29/14   Sofie RowerMike Stengel, MD   BP 122/85 mmHg  Pulse  74  Temp(Src) 98.6 F (37 C) (Oral)  Resp 14  Ht 5\' 6"  (1.676 m)  Wt 150 lb (68.04 kg)  BMI 24.22 kg/m2  SpO2 97%  LMP 12/02/2014  Physical Exam  Constitutional: She is oriented to person, place, and time. She appears well-developed and well-nourished. No distress.  HENT:  Head: Normocephalic and atraumatic.  Eyes: EOM are normal.  Neck: Normal range of motion. Neck supple. No tracheal deviation present.  Cardiovascular: Normal rate.   Pulmonary/Chest: Effort normal. No respiratory distress. She has wheezes in the right lower field and the left lower field.  Abdominal: Soft. She exhibits no distension.  Musculoskeletal: Normal range of motion.  Neurological: She is alert and oriented to person, place, and time.  Skin: Skin is warm and dry.  Psychiatric: She has a normal mood and affect. Her behavior is normal.  Nursing note and vitals reviewed.   ED Course  Procedures (including critical care time) DIAGNOSTIC STUDIES: Oxygen Saturation is 97% on room air, normal by my interpretation.    COORDINATION OF CARE: 3:21 PM-Discussed treatment plan which includes antibiotic Rx, inhaler, and CXR with pt at bedside and pt agreed to plan.   Labs Review Labs Reviewed - No data to display  Imaging Review Dg Chest 2 View  12/15/2014   CLINICAL DATA:  History of COPD, now with flu-like symptoms. Dry cough.  EXAM: CHEST  2 VIEW  COMPARISON:  06/29/2014 10/16/2013; chest CT -10/16/2013  FINDINGS: Grossly unchanged cardiac silhouette and mediastinal contours. The lungs appear hyperexpanded. There is worsening slightly nodular thickening of the pulmonary interstitium. Worsening bilateral infrahilar opacities favored to represent atelectasis. No discrete focal airspace opacities. No pleural effusion or pneumothorax. No evidence of edema. No acute osseus abnormalities. Post cholecystectomy.  IMPRESSION: Findings suggestive of airways disease / bronchitis superimposed on emphysematous change. No  focal airspace opacities to suggest pneumonia.   Electronically Signed   By: Simonne ComeJohn  Watts M.D.   On: 12/15/2014 15:21     EKG Interpretation None         MDM   Final diagnoses:  Acute bronchitis due to other specified organisms    zithromax lbuterol   I personally performed the services described in this documentation, which was scribed in my presence. The recorded information has been reviewed and is accurate.       Lonia SkinnerLeslie K MifflinvilleSofia, PA-C 12/15/14 1547  Toy CookeyMegan Docherty, MD 12/18/14 1004

## 2014-12-15 NOTE — Discharge Instructions (Signed)

## 2014-12-15 NOTE — ED Notes (Signed)
Pt reports she had her flu shot this year, but feels like she is getting the flu.  Pt reports being tired, having body aches, and a dry cough.

## 2014-12-15 NOTE — ED Notes (Signed)
Pt started 4 days ago with cough, body aches and weakness. States has been hospitalized for the past two winters with PNA. Pt is a smoker.

## 2016-05-31 ENCOUNTER — Encounter (HOSPITAL_COMMUNITY): Payer: Self-pay

## 2016-05-31 ENCOUNTER — Emergency Department (HOSPITAL_COMMUNITY): Payer: Self-pay

## 2016-05-31 ENCOUNTER — Emergency Department (HOSPITAL_COMMUNITY)
Admission: EM | Admit: 2016-05-31 | Discharge: 2016-05-31 | Disposition: A | Discharge status: Home / Self Care | Payer: Self-pay | Attending: Emergency Medicine | Admitting: Emergency Medicine

## 2016-05-31 DIAGNOSIS — T50901A Poisoning by unspecified drugs, medicaments and biological substances, accidental (unintentional), initial encounter: Secondary | ICD-10-CM

## 2016-05-31 DIAGNOSIS — Z79899 Other long term (current) drug therapy: Secondary | ICD-10-CM | POA: Insufficient documentation

## 2016-05-31 DIAGNOSIS — F17210 Nicotine dependence, cigarettes, uncomplicated: Secondary | ICD-10-CM | POA: Insufficient documentation

## 2016-05-31 DIAGNOSIS — R062 Wheezing: Secondary | ICD-10-CM | POA: Insufficient documentation

## 2016-05-31 DIAGNOSIS — J449 Chronic obstructive pulmonary disease, unspecified: Secondary | ICD-10-CM | POA: Insufficient documentation

## 2016-05-31 DIAGNOSIS — T401X1A Poisoning by heroin, accidental (unintentional), initial encounter: Secondary | ICD-10-CM | POA: Insufficient documentation

## 2016-05-31 LAB — URINE MICROSCOPIC-ADD ON

## 2016-05-31 LAB — COMPREHENSIVE METABOLIC PANEL
ALBUMIN: 3.7 g/dL (ref 3.5–5.0)
ALT: 22 U/L (ref 14–54)
ANION GAP: 10 (ref 5–15)
AST: 38 U/L (ref 15–41)
Alkaline Phosphatase: 90 U/L (ref 38–126)
BUN: <5 mg/dL — ABNORMAL LOW (ref 6–20)
CO2: 20 mmol/L — AB (ref 22–32)
Calcium: 8.6 mg/dL — ABNORMAL LOW (ref 8.9–10.3)
Chloride: 111 mmol/L (ref 101–111)
Creatinine, Ser: 0.71 mg/dL (ref 0.44–1.00)
GFR calc Af Amer: >60 mL/min (ref >60)
GFR calc non Af Amer: >60 mL/min (ref >60)
Glucose, Bld: 115 mg/dL — ABNORMAL HIGH (ref 65–99)
POTASSIUM: 3.1 mmol/L — AB (ref 3.5–5.1)
SODIUM: 141 mmol/L (ref 135–145)
TOTAL PROTEIN: 7.1 g/dL (ref 6.5–8.1)
Total Bilirubin: 0.4 mg/dL (ref 0.3–1.2)

## 2016-05-31 LAB — CBC WITH DIFFERENTIAL/PLATELET
BASOS PCT: 0 %
Basophils Absolute: 0 10*3/uL (ref 0.0–0.1)
EOS ABS: 0 10*3/uL (ref 0.0–0.7)
EOS PCT: 0 %
HCT: 38.9 % (ref 36.0–46.0)
HEMOGLOBIN: 13.1 g/dL (ref 12.0–15.0)
Lymphocytes Relative: 18 %
Lymphs Abs: 1.5 10*3/uL (ref 0.7–4.0)
MCH: 31.8 pg (ref 26.0–34.0)
MCHC: 33.7 g/dL (ref 30.0–36.0)
MCV: 94.4 fL (ref 78.0–100.0)
MONO ABS: 0.5 10*3/uL (ref 0.1–1.0)
Monocytes Relative: 6 %
NEUTROS PCT: 76 %
Neutro Abs: 6.2 10*3/uL (ref 1.7–7.7)
Platelets: 198 10*3/uL (ref 150–400)
RBC: 4.12 MIL/uL (ref 3.87–5.11)
RDW: 14 % (ref 11.5–15.5)
WBC: 8.2 10*3/uL (ref 4.0–10.5)

## 2016-05-31 LAB — URINALYSIS, ROUTINE W REFLEX MICROSCOPIC
Bilirubin Urine: NEGATIVE
GLUCOSE, UA: NEGATIVE mg/dL
Ketones, ur: NEGATIVE mg/dL
Nitrite: NEGATIVE
PH: 5.5 (ref 5.0–8.0)
Protein, ur: 100 mg/dL — AB
Specific Gravity, Urine: 1.015 (ref 1.005–1.030)

## 2016-05-31 LAB — RAPID URINE DRUG SCREEN, HOSP PERFORMED
AMPHETAMINES: NOT DETECTED
BENZODIAZEPINES: NOT DETECTED
Barbiturates: NOT DETECTED
Cocaine: POSITIVE — AB
Opiates: POSITIVE — AB
Tetrahydrocannabinol: POSITIVE — AB

## 2016-05-31 LAB — ACETAMINOPHEN LEVEL: Acetaminophen (Tylenol), Serum: <10 ug/mL — ABNORMAL LOW (ref 10–30)

## 2016-05-31 LAB — SALICYLATE LEVEL: Salicylate Lvl: <4 mg/dL (ref 2.8–30.0)

## 2016-05-31 LAB — PREGNANCY, URINE: Preg Test, Ur: NEGATIVE

## 2016-05-31 LAB — ETHANOL: ALCOHOL ETHYL (B): 86 mg/dL — AB (ref <5)

## 2016-05-31 MED ORDER — SODIUM CHLORIDE 0.9 % IV BOLUS (SEPSIS)
1000.0000 mL | Freq: Once | INTRAVENOUS | Status: AC
  Administered 2016-05-31: 1000 mL via INTRAVENOUS

## 2016-05-31 MED ORDER — SODIUM CHLORIDE 0.9 % IV SOLN: INTRAVENOUS | Status: DC

## 2016-05-31 MED ORDER — DEXAMETHASONE 4 MG PO TABS
10.0000 mg | ORAL_TABLET | Freq: Once | ORAL | Status: AC
  Administered 2016-05-31: 10 mg via ORAL
  Filled 2016-05-31: qty 3

## 2016-05-31 MED ORDER — ALBUTEROL SULFATE HFA 108 (90 BASE) MCG/ACT IN AERS: 1.0000 | INHALATION_SPRAY | Freq: Four times a day (QID) | RESPIRATORY_TRACT | 0 refills | Status: DC | PRN

## 2016-05-31 MED ORDER — ALBUTEROL SULFATE HFA 108 (90 BASE) MCG/ACT IN AERS
2.0000 | INHALATION_SPRAY | Freq: Once | RESPIRATORY_TRACT | Status: AC
  Administered 2016-05-31: 2 via RESPIRATORY_TRACT
  Filled 2016-05-31: qty 6.7

## 2016-05-31 MED ORDER — PREDNISONE 50 MG PO TABS: ORAL_TABLET | ORAL | 0 refills | Status: DC

## 2016-05-31 NOTE — ED Triage Notes (Signed)
[place] .  Pt. Injected a dose of heroine and went unresponsive. Pt.s boyfriend gave her 0.4 Narcan injection and when the Paramedics arrived Fire was doing CPR /compressions for 4-5 minutes.  Pt. Also was not breathing and was assisted with BVM by Paramedics .Paramedics gave her an additional 0.5 Narcan.  Pt. Became alert and oriented began to breathe on her own.   CBG was 131m, CAp 35-38, HR 110, resp 16 and BP 140 palpated. Pt. Arrived with a breathing treatment in process.  Pt. Is alert and oriented and very tearful  [employer] accompanied pt.

## 2016-05-31 NOTE — ED Notes (Signed)
Discharged home.  Paper scrubs given.  Bus pass provided.

## 2016-05-31 NOTE — ED Provider Notes (Signed)
[facility]   CSN: [id number] Arrival date & time: 05/31/16  1807  First Provider Contact:  None       History   Chief Complaint Chief Complaint  Patient presents with  . Other    HPI Wendy Douglas is a 40 y.o. female.  Pt brought in via EMS due to heroin od.  She injected heroin this afternoon and became unresponsive.  Her boyfriend gave her 0.4 mg of narcan which did not help.  First responders did CPR for about 4-5 minutes and EMS started BMV.  Pt given 0.5 mg additional narcan which aroused pt and she could breathe on her own.     The history is provided by the patient and the EMS personnel.    Past Medical History:  Diagnosis Date  . Asthma    IN CHILDHOOD  . Chronic narcotic dependence (HCC)   . COPD (chronic obstructive pulmonary disease) (HCC)   . Depression   . Shortness of breath   . Smoker     Patient Active Problem List   Diagnosis Date Noted  . Acute respiratory failure (HCC) 05/16/2013  . 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 Hypokalemia 05/16/2013  . Pneumonia 11/15/2012  . Acute respiratory failure with hypoxia (HCC) 11/15/2012  . Influenza A (H1N1) 11/15/2012  . COPD 11/15/2012  . Dehydration 11/15/2012  . Leukocytosis 11/15/2012  . Acute hyponatremia 11/15/2012  . SIRS (systemic inflammatory response syndrome) (HCC) 11/15/2012  . Chronic narcotic dependence (HCC)   . Smoker   . Depression     Past Surgical History:  Procedure Laterality Date  . CHOLECYSTECTOMY    . TUBAL LIGATION      OB History    No data available       Home Medications    Prior to Admission medications   Medication Sig Start Date End Date Taking? Authorizing Provider  albuterol (PROVENTIL HFA;VENTOLIN HFA) 108 (90 Base) MCG/ACT inhaler Inhale 1-2 puffs into the lungs every 6 (six) hours as needed for wheezing or shortness of breath. 05/31/16   Dan Humphreys, MD  ARIPiprazole (ABILIFY) 2 MG tablet Take 2 mg by mouth  daily.    Historical Provider, MD  azithromycin (ZITHROMAX Z-PAK) 250 MG tablet Take 1 tablet (250 mg total) by mouth daily. Take 2 first day, then 1 for 4 days 12/15/14   Elson Areas, PA-C  citalopram (CELEXA) 40 MG tablet Take 40 mg by mouth daily.    Historical Provider, MD  gabapentin (NEURONTIN) 600 MG tablet Take 300 mg by mouth 3 (three) times daily.     Historical Provider, MD  hydrOXYzine (VISTARIL) 25 MG capsule Take 25 mg by mouth 3 (three) times daily.     Historical Provider, MD  ipratropium (ATROVENT) 0.02 % nebulizer solution Take 2.5 mLs (0.5 mg total) by nebulization 3 (three) times daily. Patient not taking: Reported on 12/15/2014 10/19/13   Maretta Bees, MD  Iron Combinations (IRON COMPLEX PO) Take 1 tablet by mouth daily.    Historical Provider, MD  methadone (DOLOPHINE) 10 MG/ML solution Take 94 mg by mouth daily.     Historical Provider, MD  Multiple Vitamin (MULTIVITAMIN WITH MINERALS) TABS tablet Take 1 tablet by mouth daily.    Historical Provider, MD  predniSONE (DELTASONE) 50 MG tablet Please take one pill per day 5 days. 05/31/16   Dan Humphreys, MD    Family History Family History  Problem Relation Age of Onset  . Multiple sclerosis Mother   .  Other Father     Suicide    Social History Social History  Substance Use Topics  . Smoking status: Current Every Day Smoker    Types: Cigarettes  . Smokeless tobacco: Never Used     Comment: 1-2 puffs from time to time    uses vapor cigarette  . Alcohol use No     Allergies   Bee venom and Sulfa antibiotics   Review of Systems Review of Systems  Respiratory: Positive for shortness of breath and wheezing.   All other systems reviewed and are negative.    Physical Exam Updated Vital Signs BP 144/74   Pulse 104   Temp 97.8 F (36.6 C) (Oral)   Resp (!) 34   Ht 5\' 6"  (1.676 m)   Wt 120 lb (54.4 kg)   LMP 05/31/2016   SpO2 97%   BMI 19.37 kg/m   Physical Exam  Constitutional: She is  oriented to person, place, and time. She appears well-developed and well-nourished.  HENT:  Head: Normocephalic and atraumatic.  Right Ear: External ear normal.  Left Ear: External ear normal.  Nose: Nose normal.  Mouth/Throat: Oropharynx is clear and moist.  Eyes: Conjunctivae and EOM are normal. Pupils are equal, round, and reactive to light.  Neck: Normal range of motion. Neck supple.  Cardiovascular: Regular rhythm, normal heart sounds and intact distal pulses.  Tachycardia present.   Pulmonary/Chest: She has wheezes.  Abdominal: Soft. Bowel sounds are normal.  Musculoskeletal: Normal range of motion.       Arms: Neurological: She is alert and oriented to person, place, and time.  Skin: Skin is warm and dry.  Track marks everywhere  Psychiatric:  tearful  Nursing note and vitals reviewed.    ED Treatments / Results  Labs (all labs ordered are listed, but only abnormal results are displayed) Labs Reviewed  COMPREHENSIVE METABOLIC PANEL - Abnormal; Notable for the following:       Result Value   Potassium 3.1 (*)    CO2 20 (*)    Glucose, Bld 115 (*)    BUN <5 (*)    Calcium 8.6 (*)    All other components within normal limits  URINE RAPID DRUG SCREEN, HOSP PERFORMED - Abnormal; Notable for the following:    Opiates POSITIVE (*)    Cocaine POSITIVE (*)    Tetrahydrocannabinol POSITIVE (*)    All other components within normal limits  URINALYSIS, ROUTINE W REFLEX MICROSCOPIC (NOT AT [facility]) - Abnormal; Notable for the following:    APPearance CLOUDY (*)    Hgb urine dipstick LARGE (*)    Protein, ur 100 (*)    Leukocytes, UA SMALL (*)    All other components within normal limits  ACETAMINOPHEN LEVEL - Abnormal; Notable for the following:    Acetaminophen (Tylenol), Serum <10 (*)    All other components within normal limits  ETHANOL - Abnormal; Notable for the following:    Alcohol, Ethyl (B) 86 (*)    All other components within normal limits  URINE MICROSCOPIC-ADD  ON - Abnormal; Notable for the following:    Squamous Epithelial / LPF 6-30 (*)    Bacteria, UA RARE (*)    All other components within normal limits  CBC WITH DIFFERENTIAL/PLATELET  SALICYLATE LEVEL  PREGNANCY, URINE    EKG  EKG Interpretation  Date/Time:  Saturday May 31 2016 18:08:20 EDT Ventricular Rate:  119 PR Interval:    QRS Duration: 80 QT Interval:  326 QTC Calculation: 459 R  Axis:   79 Text Interpretation:  Sinus tachycardia Biatrial enlargement Left ventricular hypertrophy Confirmed by Particia Nearing MD, Makhai Fulco ([id number]) on 05/31/2016 7:02:45 PM       Radiology Dg Chest Port 1 View  Result Date: 05/31/2016 CLINICAL DATA:  Heroin overdose. EXAM: PORTABLE CHEST 1 VIEW COMPARISON:  Two-view chest x-ray 12/15/2014 FINDINGS: Defibrillator pads are in place. The heart size is normal. There is no edema or effusion to suggest failure. No focal airspace disease is present. IMPRESSION: 1. No acute cardiopulmonary disease. 2. Defibrillator pads in place. Electronically Signed   By: Marin Roberts M.D.   On: 05/31/2016 19:01   Procedures Procedures (including critical care time)  Medications Ordered in ED Medications  sodium chloride 0.9 % bolus 1,000 mL (0 mLs Intravenous Stopped 05/31/16 2145)  albuterol (PROVENTIL HFA;VENTOLIN HFA) 108 (90 Base) MCG/ACT inhaler 2 puff (2 puffs Inhalation Given 05/31/16 2152)  dexamethasone (DECADRON) tablet 10 mg (10 mg Oral Given 05/31/16 2152)     Initial Impression / Assessment and Plan / ED Course  I have reviewed the triage vital signs and the nursing notes.  Pertinent labs & imaging results that were available during my care of the patient were reviewed by me and considered in my medical decision making (see chart for details).  Clinical Course    Pt was watched for a number of hours and has remained stable.  She wants to leave.  She knows to return if worse.  Final Clinical Impressions(s) / ED Diagnoses   Final diagnoses:    Drug overdose, accidental or unintentional, initial encounter  Wheezing    New Prescriptions Discharge Medication List as of 05/31/2016  9:46 PM       Jacalyn Lefevre, MD 06/01/16 2239

## 2016-05-31 NOTE — ED Notes (Signed)
Pt given water at RN's request.

## 2016-05-31 NOTE — ED Notes (Signed)
Pt stated she would like to speak to the nurse about leaving. States she does not want to be here any longer with all of the wires and cords attached to her.
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
## 2016-07-15 ENCOUNTER — Emergency Department (HOSPITAL_COMMUNITY): Payer: Self-pay

## 2016-07-15 ENCOUNTER — Encounter (HOSPITAL_COMMUNITY)
Admission: EM | Disposition: A | Discharge status: Home / Self Care | Payer: Self-pay | Source: Home / Self Care | Attending: Internal Medicine

## 2016-07-15 ENCOUNTER — Inpatient Hospital Stay (HOSPITAL_COMMUNITY)
Admission: EM | Admit: 2016-07-15 | Discharge: 2016-07-22 | DRG: 982 | Disposition: A | Discharge status: Home / Self Care | Payer: Self-pay | Attending: Internal Medicine | Admitting: Internal Medicine

## 2016-07-15 ENCOUNTER — Encounter (HOSPITAL_COMMUNITY): Payer: Self-pay | Admitting: Emergency Medicine

## 2016-07-15 ENCOUNTER — Inpatient Hospital Stay (HOSPITAL_COMMUNITY): Payer: Self-pay | Admitting: Certified Registered"

## 2016-07-15 ENCOUNTER — Inpatient Hospital Stay (HOSPITAL_COMMUNITY): Payer: Self-pay

## 2016-07-15 DIAGNOSIS — F1910 Other psychoactive substance abuse, uncomplicated: Secondary | ICD-10-CM

## 2016-07-15 DIAGNOSIS — F1120 Opioid dependence, uncomplicated: Secondary | ICD-10-CM | POA: Diagnosis present

## 2016-07-15 DIAGNOSIS — Z23 Encounter for immunization: Secondary | ICD-10-CM

## 2016-07-15 DIAGNOSIS — Z6820 Body mass index (BMI) 20.0-20.9, adult: Secondary | ICD-10-CM

## 2016-07-15 DIAGNOSIS — F319 Bipolar disorder, unspecified: Secondary | ICD-10-CM | POA: Diagnosis present

## 2016-07-15 DIAGNOSIS — M65142 Other infective (teno)synovitis, left hand: Secondary | ICD-10-CM | POA: Diagnosis present

## 2016-07-15 DIAGNOSIS — B182 Chronic viral hepatitis C: Secondary | ICD-10-CM

## 2016-07-15 DIAGNOSIS — R209 Unspecified disturbances of skin sensation: Secondary | ICD-10-CM | POA: Diagnosis present

## 2016-07-15 DIAGNOSIS — E440 Moderate protein-calorie malnutrition: Secondary | ICD-10-CM | POA: Diagnosis present

## 2016-07-15 DIAGNOSIS — F419 Anxiety disorder, unspecified: Secondary | ICD-10-CM | POA: Diagnosis present

## 2016-07-15 DIAGNOSIS — F17210 Nicotine dependence, cigarettes, uncomplicated: Secondary | ICD-10-CM | POA: Diagnosis present

## 2016-07-15 DIAGNOSIS — B1920 Unspecified viral hepatitis C without hepatic coma: Secondary | ICD-10-CM | POA: Diagnosis present

## 2016-07-15 DIAGNOSIS — L03114 Cellulitis of left upper limb: Principal | ICD-10-CM | POA: Diagnosis present

## 2016-07-15 DIAGNOSIS — A599 Trichomoniasis, unspecified: Secondary | ICD-10-CM | POA: Diagnosis present

## 2016-07-15 DIAGNOSIS — L03119 Cellulitis of unspecified part of limb: Secondary | ICD-10-CM

## 2016-07-15 DIAGNOSIS — J449 Chronic obstructive pulmonary disease, unspecified: Secondary | ICD-10-CM | POA: Diagnosis present

## 2016-07-15 HISTORY — DX: Anxiety disorder, unspecified: F41.9

## 2016-07-15 HISTORY — DX: Other psychoactive substance use, unspecified, uncomplicated: F19.90

## 2016-07-15 HISTORY — PX: I & D EXTREMITY: SHX5045

## 2016-07-15 HISTORY — PX: INCISION AND DRAINAGE ABSCESS: SHX5864

## 2016-07-15 HISTORY — DX: Pneumonia, unspecified organism: J18.9

## 2016-07-15 HISTORY — DX: Bipolar disorder, unspecified: F31.9

## 2016-07-15 LAB — I-STAT CG4 LACTIC ACID, ED
LACTIC ACID, VENOUS: 1.46 mmol/L (ref 0.5–1.9)
LACTIC ACID, VENOUS: 0.98 mmol/L (ref 0.5–1.9)

## 2016-07-15 LAB — URINALYSIS, ROUTINE W REFLEX MICROSCOPIC
BILIRUBIN URINE: NEGATIVE
Glucose, UA: NEGATIVE mg/dL
KETONES UR: NEGATIVE mg/dL
NITRITE: NEGATIVE
Protein, ur: NEGATIVE mg/dL
Specific Gravity, Urine: <1.005 — ABNORMAL LOW (ref 1.005–1.030)
pH: 6 (ref 5.0–8.0)

## 2016-07-15 LAB — COMPREHENSIVE METABOLIC PANEL
ALK PHOS: 123 U/L (ref 38–126)
ALT: 18 U/L (ref 14–54)
ANION GAP: 11 (ref 5–15)
AST: 22 U/L (ref 15–41)
Albumin: 4.1 g/dL (ref 3.5–5.0)
BUN: 8 mg/dL (ref 6–20)
CALCIUM: 9.2 mg/dL (ref 8.9–10.3)
CO2: 22 mmol/L (ref 22–32)
Chloride: 102 mmol/L (ref 101–111)
Creatinine, Ser: 0.69 mg/dL (ref 0.44–1.00)
Glucose, Bld: 98 mg/dL (ref 65–99)
Potassium: 3.9 mmol/L (ref 3.5–5.1)
SODIUM: 135 mmol/L (ref 135–145)
TOTAL PROTEIN: 7.8 g/dL (ref 6.5–8.1)
Total Bilirubin: 0.6 mg/dL (ref 0.3–1.2)

## 2016-07-15 LAB — CBC WITH DIFFERENTIAL/PLATELET
BASOS ABS: 0 10*3/uL (ref 0.0–0.1)
BASOS PCT: 0 %
EOS ABS: 0.1 10*3/uL (ref 0.0–0.7)
EOS PCT: 1 %
HCT: 42.4 % (ref 36.0–46.0)
Hemoglobin: 14.2 g/dL (ref 12.0–15.0)
Lymphocytes Relative: 20 %
Lymphs Abs: 2.5 10*3/uL (ref 0.7–4.0)
MCH: 31.9 pg (ref 26.0–34.0)
MCHC: 33.5 g/dL (ref 30.0–36.0)
MCV: 95.3 fL (ref 78.0–100.0)
MONO ABS: 0.8 10*3/uL (ref 0.1–1.0)
Monocytes Relative: 7 %
NEUTROS ABS: 8.9 10*3/uL — AB (ref 1.7–7.7)
Neutrophils Relative %: 72 %
PLATELETS: 216 10*3/uL (ref 150–400)
RBC: 4.45 MIL/uL (ref 3.87–5.11)
RDW: 14 % (ref 11.5–15.5)
WBC: 12.3 10*3/uL — ABNORMAL HIGH (ref 4.0–10.5)

## 2016-07-15 LAB — SURGICAL PCR SCREEN
MRSA, PCR: POSITIVE — AB
Staphylococcus aureus: POSITIVE — AB

## 2016-07-15 LAB — RAPID URINE DRUG SCREEN, HOSP PERFORMED
Amphetamines: NOT DETECTED
BARBITURATES: NOT DETECTED
Benzodiazepines: NOT DETECTED
COCAINE: POSITIVE — AB
OPIATES: POSITIVE — AB
Tetrahydrocannabinol: POSITIVE — AB

## 2016-07-15 LAB — URINE MICROSCOPIC-ADD ON: RBC / HPF: NONE SEEN RBC/hpf (ref 0–5)

## 2016-07-15 LAB — I-STAT BETA HCG BLOOD, ED (MC, WL, AP ONLY)

## 2016-07-15 SURGERY — IRRIGATION AND DEBRIDEMENT EXTREMITY
Anesthesia: General | Site: Hand | Laterality: Left

## 2016-07-15 MED ORDER — GABAPENTIN 600 MG PO TABS: 300.0000 mg | ORAL_TABLET | Freq: Three times a day (TID) | ORAL | Status: DC

## 2016-07-15 MED ORDER — PROMETHAZINE HCL 25 MG/ML IJ SOLN: 6.2500 mg | INTRAMUSCULAR | Status: DC | PRN

## 2016-07-15 MED ORDER — BUPIVACAINE HCL (PF) 0.25 % IJ SOLN
INTRAMUSCULAR | Status: AC
  Filled 2016-07-15: qty 30

## 2016-07-15 MED ORDER — KETOROLAC TROMETHAMINE 30 MG/ML IJ SOLN
30.0000 mg | Freq: Once | INTRAMUSCULAR | Status: AC
  Administered 2016-07-15: 30 mg via INTRAVENOUS
  Filled 2016-07-15: qty 1

## 2016-07-15 MED ORDER — SODIUM CHLORIDE 0.9 % IV BOLUS (SEPSIS)
1000.0000 mL | Freq: Once | INTRAVENOUS | Status: AC
  Administered 2016-07-15: 1000 mL via INTRAVENOUS

## 2016-07-15 MED ORDER — HYDROXYZINE PAMOATE 25 MG PO CAPS
25.0000 mg | ORAL_CAPSULE | Freq: Three times a day (TID) | ORAL | Status: DC
  Filled 2016-07-15 (×2): qty 1

## 2016-07-15 MED ORDER — SODIUM CHLORIDE 0.9 % IR SOLN
Status: DC | PRN
  Administered 2016-07-15: 3000 mL
  Administered 2016-07-15: 1000 mL

## 2016-07-15 MED ORDER — GADOBENATE DIMEGLUMINE 529 MG/ML IV SOLN
10.0000 mL | Freq: Once | INTRAVENOUS | Status: AC
  Administered 2016-07-15: 10 mL via INTRAVENOUS

## 2016-07-15 MED ORDER — LIDOCAINE HCL (CARDIAC) 20 MG/ML IV SOLN
INTRAVENOUS | Status: DC | PRN
  Administered 2016-07-15: 20 mg via INTRAVENOUS

## 2016-07-15 MED ORDER — IPRATROPIUM-ALBUTEROL 0.5-2.5 (3) MG/3ML IN SOLN: 3.0000 mL | Freq: Once | RESPIRATORY_TRACT | Status: DC

## 2016-07-15 MED ORDER — ARIPIPRAZOLE 2 MG PO TABS: 2.0000 mg | ORAL_TABLET | Freq: Every day | ORAL | Status: DC

## 2016-07-15 MED ORDER — MIDAZOLAM HCL 2 MG/2ML IJ SOLN
INTRAMUSCULAR | Status: AC
  Filled 2016-07-15: qty 2

## 2016-07-15 MED ORDER — SODIUM CHLORIDE 0.9 % IV SOLN
INTRAVENOUS | Status: DC
  Administered 2016-07-15 – 2016-07-18 (×7): via INTRAVENOUS
  Administered 2016-07-19: 1 mL via INTRAVENOUS
  Administered 2016-07-19: 17:00:00 via INTRAVENOUS

## 2016-07-15 MED ORDER — PROPOFOL 10 MG/ML IV BOLUS
INTRAVENOUS | Status: DC | PRN
  Administered 2016-07-15: 200 mg via INTRAVENOUS

## 2016-07-15 MED ORDER — METHADONE HCL 10 MG/ML PO CONC: 94.0000 mg | Freq: Every day | ORAL | Status: DC

## 2016-07-15 MED ORDER — KETOROLAC TROMETHAMINE 30 MG/ML IJ SOLN
30.0000 mg | Freq: Four times a day (QID) | INTRAMUSCULAR | Status: AC | PRN
  Administered 2016-07-15 – 2016-07-20 (×11): 30 mg via INTRAVENOUS
  Filled 2016-07-15 (×10): qty 1

## 2016-07-15 MED ORDER — METRONIDAZOLE 500 MG PO TABS
2000.0000 mg | ORAL_TABLET | Freq: Once | ORAL | Status: AC
  Administered 2016-07-15: 2000 mg via ORAL
  Filled 2016-07-15: qty 4

## 2016-07-15 MED ORDER — ONDANSETRON HCL 4 MG/2ML IJ SOLN
INTRAMUSCULAR | Status: AC
  Filled 2016-07-15: qty 8

## 2016-07-15 MED ORDER — HYDROMORPHONE HCL 1 MG/ML IJ SOLN
INTRAMUSCULAR | Status: AC
  Administered 2016-07-15: 0.5 mg via INTRAVENOUS
  Filled 2016-07-15: qty 1

## 2016-07-15 MED ORDER — INFLUENZA VAC SPLIT QUAD 0.5 ML IM SUSY
0.5000 mL | PREFILLED_SYRINGE | INTRAMUSCULAR | Status: AC
  Administered 2016-07-18: 0.5 mL via INTRAMUSCULAR
  Filled 2016-07-15: qty 0.5

## 2016-07-15 MED ORDER — HYDROMORPHONE HCL 1 MG/ML IJ SOLN
INTRAMUSCULAR | Status: AC
  Filled 2016-07-15: qty 1

## 2016-07-15 MED ORDER — MEPERIDINE HCL 25 MG/ML IJ SOLN: 6.2500 mg | INTRAMUSCULAR | Status: DC | PRN

## 2016-07-15 MED ORDER — CLINDAMYCIN PHOSPHATE 600 MG/50ML IV SOLN
600.0000 mg | Freq: Three times a day (TID) | INTRAVENOUS | Status: DC
Start: 2016-07-15 — End: 2016-07-17
  Administered 2016-07-15 – 2016-07-17 (×6): 600 mg via INTRAVENOUS
  Filled 2016-07-15 (×9): qty 50

## 2016-07-15 MED ORDER — PROPOFOL 10 MG/ML IV BOLUS
INTRAVENOUS | Status: AC
  Filled 2016-07-15: qty 20

## 2016-07-15 MED ORDER — CHLORHEXIDINE GLUCONATE CLOTH 2 % EX PADS
6.0000 | MEDICATED_PAD | Freq: Every day | CUTANEOUS | Status: AC
  Administered 2016-07-16 – 2016-07-20 (×5): 6 via TOPICAL

## 2016-07-15 MED ORDER — HYDROMORPHONE HCL 1 MG/ML IJ SOLN
1.0000 mg | Freq: Once | INTRAMUSCULAR | Status: AC
  Administered 2016-07-15: 1 mg via INTRAVENOUS
  Filled 2016-07-15: qty 1

## 2016-07-15 MED ORDER — FENTANYL CITRATE (PF) 100 MCG/2ML IJ SOLN
INTRAMUSCULAR | Status: AC
  Filled 2016-07-15: qty 4

## 2016-07-15 MED ORDER — ENSURE ENLIVE PO LIQD
237.0000 mL | Freq: Two times a day (BID) | ORAL | Status: DC
  Administered 2016-07-16 – 2016-07-21 (×11): 237 mL via ORAL

## 2016-07-15 MED ORDER — OXYCODONE HCL 5 MG PO TABS
5.0000 mg | ORAL_TABLET | ORAL | Status: DC | PRN
  Administered 2016-07-15 – 2016-07-22 (×25): 5 mg via ORAL
  Filled 2016-07-15 (×26): qty 1

## 2016-07-15 MED ORDER — ONDANSETRON HCL 4 MG PO TABS
4.0000 mg | ORAL_TABLET | Freq: Four times a day (QID) | ORAL | Status: DC | PRN
Start: 2016-07-15 — End: 2016-07-22

## 2016-07-15 MED ORDER — METRONIDAZOLE 500 MG PO TABS: 500.0000 mg | ORAL_TABLET | Freq: Two times a day (BID) | ORAL | Status: DC

## 2016-07-15 MED ORDER — FENTANYL CITRATE (PF) 100 MCG/2ML IJ SOLN
INTRAMUSCULAR | Status: DC | PRN
  Administered 2016-07-15 (×2): 100 ug via INTRAVENOUS

## 2016-07-15 MED ORDER — MIDAZOLAM HCL 5 MG/5ML IJ SOLN
INTRAMUSCULAR | Status: DC | PRN
  Administered 2016-07-15: 2 mg via INTRAVENOUS

## 2016-07-15 MED ORDER — MUPIROCIN 2 % EX OINT
1.0000 "application " | TOPICAL_OINTMENT | Freq: Two times a day (BID) | CUTANEOUS | Status: AC
  Administered 2016-07-15 – 2016-07-20 (×10): 1 via NASAL
  Filled 2016-07-15: qty 22

## 2016-07-15 MED ORDER — VANCOMYCIN HCL IN DEXTROSE 1-5 GM/200ML-% IV SOLN
INTRAVENOUS | Status: AC
  Filled 2016-07-15: qty 200

## 2016-07-15 MED ORDER — HYDROMORPHONE HCL 1 MG/ML IJ SOLN
0.2500 mg | INTRAMUSCULAR | Status: DC | PRN
  Administered 2016-07-15 (×4): 0.5 mg via INTRAVENOUS

## 2016-07-15 MED ORDER — ACETAMINOPHEN 325 MG PO TABS
650.0000 mg | ORAL_TABLET | Freq: Four times a day (QID) | ORAL | Status: DC | PRN
  Administered 2016-07-16: 650 mg via ORAL
  Filled 2016-07-15: qty 2

## 2016-07-15 MED ORDER — LACTATED RINGERS IV SOLN
INTRAVENOUS | Status: DC | PRN
Start: 2016-07-15 — End: 2016-07-15
  Administered 2016-07-15: 19:00:00 via INTRAVENOUS

## 2016-07-15 MED ORDER — KETAMINE HCL 100 MG/ML IJ SOLN
INTRAMUSCULAR | Status: AC
  Filled 2016-07-15: qty 1

## 2016-07-15 MED ORDER — ONDANSETRON HCL 4 MG/2ML IJ SOLN: 4.0000 mg | Freq: Four times a day (QID) | INTRAMUSCULAR | Status: DC | PRN

## 2016-07-15 MED ORDER — SODIUM CHLORIDE 0.9 % IV SOLN
INTRAVENOUS | Status: DC | PRN
  Administered 2016-07-15: 1000 mg via INTRAVENOUS

## 2016-07-15 MED ORDER — VANCOMYCIN HCL IN DEXTROSE 1-5 GM/200ML-% IV SOLN
1000.0000 mg | Freq: Once | INTRAVENOUS | Status: AC
  Administered 2016-07-15: 1000 mg via INTRAVENOUS
  Filled 2016-07-15: qty 200

## 2016-07-15 MED ORDER — KETAMINE HCL 100 MG/ML IJ SOLN
INTRAMUSCULAR | Status: DC | PRN
  Administered 2016-07-15: 100 mg via INTRAVENOUS
  Administered 2016-07-15: 50 mg via INTRAVENOUS

## 2016-07-15 MED ORDER — VANCOMYCIN HCL IN DEXTROSE 750-5 MG/150ML-% IV SOLN
750.0000 mg | Freq: Two times a day (BID) | INTRAVENOUS | Status: DC
  Filled 2016-07-15: qty 150

## 2016-07-15 MED ORDER — ROCURONIUM BROMIDE 10 MG/ML (PF) SYRINGE
PREFILLED_SYRINGE | INTRAVENOUS | Status: AC
  Filled 2016-07-15: qty 10

## 2016-07-15 MED ORDER — HYDROXYZINE HCL 25 MG PO TABS
25.0000 mg | ORAL_TABLET | Freq: Three times a day (TID) | ORAL | Status: DC
  Administered 2016-07-15 – 2016-07-22 (×20): 25 mg via ORAL
  Filled 2016-07-15 (×20): qty 1

## 2016-07-15 MED ORDER — METRONIDAZOLE 500 MG PO TABS
500.0000 mg | ORAL_TABLET | Freq: Two times a day (BID) | ORAL | Status: DC
  Administered 2016-07-16 – 2016-07-17 (×3): 500 mg via ORAL
  Filled 2016-07-15 (×3): qty 1

## 2016-07-15 MED ORDER — CITALOPRAM HYDROBROMIDE 10 MG PO TABS: 40.0000 mg | ORAL_TABLET | Freq: Every day | ORAL | Status: DC

## 2016-07-15 MED ORDER — ACETAMINOPHEN 650 MG RE SUPP
650.0000 mg | Freq: Four times a day (QID) | RECTAL | Status: DC | PRN
Start: 2016-07-15 — End: 2016-07-22

## 2016-07-15 MED ORDER — MIDAZOLAM HCL 2 MG/2ML IJ SOLN
0.5000 mg | Freq: Once | INTRAMUSCULAR | Status: AC | PRN
  Administered 2016-07-15: 1 mg via INTRAVENOUS

## 2016-07-15 MED ORDER — HYDROMORPHONE HCL 1 MG/ML IJ SOLN
INTRAMUSCULAR | Status: DC | PRN
  Administered 2016-07-15 (×2): 1 mg via INTRAVENOUS

## 2016-07-15 MED ORDER — ONDANSETRON HCL 4 MG/2ML IJ SOLN
INTRAMUSCULAR | Status: DC | PRN
  Administered 2016-07-15: 4 mg via INTRAVENOUS

## 2016-07-15 SURGICAL SUPPLY — 46 items
BANDAGE ACE 4X5 VEL STRL LF (GAUZE/BANDAGES/DRESSINGS) IMPLANT
BANDAGE ELASTIC 3 VELCRO ST LF (GAUZE/BANDAGES/DRESSINGS) ×3 IMPLANT
BANDAGE ELASTIC 4 VELCRO ST LF (GAUZE/BANDAGES/DRESSINGS) ×3 IMPLANT
BNDG CONFORM 2 STRL LF (GAUZE/BANDAGES/DRESSINGS) IMPLANT
BNDG GAUZE ELAST 4 BULKY (GAUZE/BANDAGES/DRESSINGS) ×3 IMPLANT
CONT SPEC STER OR (MISCELLANEOUS) ×3 IMPLANT
CORDS BIPOLAR (ELECTRODE) ×3 IMPLANT
CUFF TOURNIQUET SINGLE 18IN (TOURNIQUET CUFF) ×3 IMPLANT
CUFF TOURNIQUET SINGLE 24IN (TOURNIQUET CUFF) IMPLANT
DRAIN PENROSE 1/4X12 LTX STRL (WOUND CARE) ×3 IMPLANT
DRSG ADAPTIC 3X8 NADH LF (GAUZE/BANDAGES/DRESSINGS) IMPLANT
DRSG EMULSION OIL 3X3 NADH (GAUZE/BANDAGES/DRESSINGS) ×3 IMPLANT
GAUZE SPONGE 4X4 12PLY STRL (GAUZE/BANDAGES/DRESSINGS) ×3 IMPLANT
GAUZE XEROFORM 1X8 LF (GAUZE/BANDAGES/DRESSINGS) ×3 IMPLANT
GLOVE BIOGEL M 8.0 STRL (GLOVE) ×3 IMPLANT
GLOVE SS BIOGEL STRL SZ 8 (GLOVE) ×1 IMPLANT
GLOVE SUPERSENSE BIOGEL SZ 8 (GLOVE) ×2
GOWN STRL REUS W/ TWL LRG LVL3 (GOWN DISPOSABLE) ×1 IMPLANT
GOWN STRL REUS W/ TWL XL LVL3 (GOWN DISPOSABLE) ×2 IMPLANT
GOWN STRL REUS W/TWL LRG LVL3 (GOWN DISPOSABLE) ×2
GOWN STRL REUS W/TWL XL LVL3 (GOWN DISPOSABLE) ×4
HANDPIECE INTERPULSE COAX TIP (DISPOSABLE)
KIT BASIN OR (CUSTOM PROCEDURE TRAY) ×3 IMPLANT
KIT ROOM TURNOVER OR (KITS) ×3 IMPLANT
MANIFOLD NEPTUNE II (INSTRUMENTS) ×3 IMPLANT
NEEDLE HYPO 25GX1X1/2 BEV (NEEDLE) ×3 IMPLANT
NS IRRIG 1000ML POUR BTL (IV SOLUTION) ×3 IMPLANT
PACK ORTHO EXTREMITY (CUSTOM PROCEDURE TRAY) ×3 IMPLANT
PAD ARMBOARD 7.5X6 YLW CONV (MISCELLANEOUS) ×3 IMPLANT
PAD CAST 3X4 CTTN HI CHSV (CAST SUPPLIES) ×1 IMPLANT
PAD CAST 4YDX4 CTTN HI CHSV (CAST SUPPLIES) ×1 IMPLANT
PADDING CAST COTTON 3X4 STRL (CAST SUPPLIES) ×2
PADDING CAST COTTON 4X4 STRL (CAST SUPPLIES) ×2
PILLOW ARM CARTER ADULT (MISCELLANEOUS) ×3 IMPLANT
SET CYSTO W/LG BORE CLAMP LF (SET/KITS/TRAYS/PACK) ×3 IMPLANT
SET HNDPC FAN SPRY TIP SCT (DISPOSABLE) IMPLANT
SPONGE LAP 4X18 X RAY DECT (DISPOSABLE) IMPLANT
SUT PROLENE 3 0 PS 2 (SUTURE) ×3 IMPLANT
SWAB COLLECTION DEVICE MRSA (MISCELLANEOUS) ×3 IMPLANT
SYR CONTROL 10ML LL (SYRINGE) ×3 IMPLANT
TOWEL OR 17X24 6PK STRL BLUE (TOWEL DISPOSABLE) ×3 IMPLANT
TOWEL OR 17X26 10 PK STRL BLUE (TOWEL DISPOSABLE) ×3 IMPLANT
TUBE ANAEROBIC SPECIMEN COL (MISCELLANEOUS) ×3 IMPLANT
TUBE CONNECTING 12'X1/4 (SUCTIONS) ×1
TUBE CONNECTING 12X1/4 (SUCTIONS) ×2 IMPLANT
YANKAUER SUCT BULB TIP NO VENT (SUCTIONS) ×3 IMPLANT

## 2016-07-15 NOTE — Progress Notes (Signed)
Pharmacy Antibiotic Note  Wendy Douglas is a 40 y.o. female admitted on 07/15/2016 with cellulitis.  Pharmacy has been consulted for vancomycin dosing.  Pt received vancomycin 1g IV once in the ED.  Plan: Vancomycin 750mg  IV every 12 hours.  Goal trough 10-15 mcg/mL.  Monitor culture data, renal function and clinical course VT at SS prn     Temp (24hrs), Avg:98.8 F (37.1 C), Min:98.8 F (37.1 C), Max:98.8 F (37.1 C)   Recent Labs Lab 07/15/16 0816 07/15/16 0828  WBC 12.3*  --   CREATININE 0.69  --   LATICACIDVEN  --  1.46    CrCl cannot be calculated (Unknown ideal weight.).    Allergies  Allergen Reactions  . Bee Venom Shortness Of Breath and Swelling  . Sulfa Antibiotics Rash    Antimicrobials this admission: Vanc 9/12 >>    Arlean HoppingCorey M. Newman PiesBall, PharmD, BCPS Clinical Pharmacist Pager [phone] 07/15/2016 9:02 AM
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
## 2016-07-15 NOTE — Op Note (Signed)
Op note [id number] Aldric Wenzler MD

## 2016-07-15 NOTE — ED Triage Notes (Signed)
Pt from home with c/o left hand swelling starting a few days ago after she inject her hand with cocaine stating she "missed."  Mild swelling started initially but significant swelling has occurred in the last 4 hours now to her forearm with numbness and tingling.  Pt reports using a clean needle.  Ambulatory, NAD, A&O.

## 2016-07-15 NOTE — ED Notes (Signed)
Pt given a Mal[place]key sandwich and graham crackers, per Jackalyn LombardErin H, RN.

## 2016-07-15 NOTE — Transfer of Care (Signed)
Immediate Anesthesia Transfer of Care Note  Patient: Wendy Douglas  Procedure(s) Performed: Procedure(s): IRRIGATION AND DEBRIDEMENT LEFT HAND WITH TENOSYNOVECTOMY (Left)  Patient Location: PACU  Anesthesia Type:General  Level of Consciousness: responds to stimulation  Airway & Oxygen Therapy: Patient Spontanous Breathing and Patient connected to face mask oxygen  Post-op Assessment: Report given to RN and Post -op Vital signs reviewed and stable  Post vital signs: Reviewed and stable  Last Vitals:  Vitals:   07/15/16 1831 07/15/16 2029  BP: 140/74   Pulse: 73 (P) 67  Resp: 18 (P) 16  Temp: 36.8 C (P) 36.5 C    Last Pain:  Vitals:   07/15/16 1834  TempSrc:   PainSc: Asleep         Complications: No apparent anesthesia complications

## 2016-07-15 NOTE — H&P (Signed)
History and Physical    Docia Klar BMW:[id number] DOB: 11-Sep-1976 DOA: 07/15/2016  PCP: Default, Provider, MD Patient coming from: home  Chief Complaint: L hand and arm swelling  HPI: Wendy Douglas is a 40 y.o. female with medical history significant of COPD, polysubstance abuse/IVDU, presenting with left hand swelling pain in the redness. Started 1 day ago. Patient endorses attempted cocaine injection between the fourth and fifth fingers on the dorsum of the hand 3 days ago. States that this did not go into the vein directly. Symptoms are constant and getting worse. Denies fevers, chills, neck stiffness, headache, palpitations, chest pain, shortness of breath, nausea, vomiting, diarrhea, dysuria, frequency, vaginal discharge, back pain.     ED Course: Objective findings outlined below. Hand surgery consult by EDP.  Review of Systems: As per HPI otherwise 10 point review of systems negative.   Ambulatory Status: Number stricture  Past Medical History:  Diagnosis Date  . Asthma    IN CHILDHOOD  . Chronic narcotic dependence (HCC)   . COPD (chronic obstructive pulmonary disease) (HCC)   . Depression   . IVDU (intravenous drug user)   . Shortness of breath   . Smoker     Past Surgical History:  Procedure Laterality Date  . CHOLECYSTECTOMY    . TUBAL LIGATION      Social History   Social History  . Marital status: Legally Separated    Spouse name: N/A  . Number of children: N/A  . Years of education: N/A   Occupational History  . Not on file.   Social History Main Topics  . Smoking status: Current Every Day Smoker    Packs/day: 0.50    Types: Cigarettes  . Smokeless tobacco: Never Used     Comment: 1-2 puffs from time to time    uses vapor cigarette  . Alcohol use 3.6 oz/week    6 Cans of beer per week  . Drug use:     Types: IV, Cocaine, Marijuana  . Sexual activity: Yes   Other Topics Concern  . Not on file   Social History Narrative  . No narrative  on file    Allergies  Allergen Reactions  . Bee Venom Shortness Of Breath and Swelling  . Sulfa Antibiotics Rash    Family History  Problem Relation Age of Onset  . Multiple sclerosis Mother   . Other Father     Suicide    Prior to Admission medications   Medication Sig Start Date End Date Taking? Authorizing Provider  albuterol (PROVENTIL HFA;VENTOLIN HFA) 108 (90 Base) MCG/ACT inhaler Inhale 1-2 puffs into the lungs every 6 (six) hours as needed for wheezing or shortness of breath. Patient not taking: Reported on 07/15/2016 05/31/16   Dan Humphreys, MD  ARIPiprazole (ABILIFY) 2 MG tablet Take 2 mg by mouth daily.    Historical Provider, MD  azithromycin (ZITHROMAX Z-PAK) 250 MG tablet Take 1 tablet (250 mg total) by mouth daily. Take 2 first day, then 1 for 4 days Patient not taking: Reported on 07/15/2016 12/15/14   Elson Areas, PA-C  citalopram (CELEXA) 40 MG tablet Take 40 mg by mouth daily.    Historical Provider, MD  gabapentin (NEURONTIN) 600 MG tablet Take 300 mg by mouth 3 (three) times daily.     Historical Provider, MD  hydrOXYzine (VISTARIL) 25 MG capsule Take 25 mg by mouth 3 (three) times daily.     Historical Provider, MD  ipratropium (ATROVENT) 0.02 % nebulizer solution Take 2.5  mLs (0.5 mg total) by nebulization 3 (three) times daily. Patient not taking: Reported on 12/15/2014 10/19/13   Maretta Bees, MD  Iron Combinations (IRON COMPLEX PO) Take 1 tablet by mouth daily.    Historical Provider, MD  methadone (DOLOPHINE) 10 MG/ML solution Take 94 mg by mouth daily.     Historical Provider, MD  Multiple Vitamin (MULTIVITAMIN WITH MINERALS) TABS tablet Take 1 tablet by mouth daily.    Historical Provider, MD  predniSONE (DELTASONE) 50 MG tablet Please take one pill per day 5 days. Patient not taking: Reported on 07/15/2016 05/31/16   Dan Humphreys, MD    Physical Exam: Vitals:   07/15/16 1330 07/15/16 1423 07/15/16 1430 07/15/16 1612  BP: 123/73 136/88 137/86  134/82  Pulse: 84 78 76 78  Resp:  15 20   Temp:      TempSrc:      SpO2: 98% 98% 100% 100%  Weight:    56.7 kg (125 lb)  Height:    5\' 5"  (1.651 m)     General:  Appears calm and comfortable Eyes:  PERRL, EOMI, normal lids, iris ENT:  grossly normal hearing, lips & tongue, mmm Neck:  no LAD, masses or thyromegaly Cardiovascular:  RRR, no m/r/g. No LE edema.  Respiratory:  CTA bilaterally, no w/r/r. Normal respiratory effort. Abdomen:  soft, ntnd, NABS Skin:  Left hand/arm swollen, indurated and erythematous from approximately the metacarpals to the elbow Musculoskeletal:  grossly normal tone BUE/BLE, good ROM, no bony abnormality Psychiatric:  grossly normal mood and affect, speech fluent and appropriate, AOx3 Neurologic:  CN 2-12 grossly intact, moves all extremities in coordinated fashion, sensation intact  Labs on Admission: I have personally reviewed following labs and imaging studies  CBC:  Recent Labs Lab 07/15/16 0816  WBC 12.3*  NEUTROABS 8.9*  HGB 14.2  HCT 42.4  MCV 95.3  PLT 216   Basic Metabolic Panel:  Recent Labs Lab 07/15/16 0816  NA 135  K 3.9  CL 102  CO2 22  GLUCOSE 98  BUN 8  CREATININE 0.69  CALCIUM 9.2   GFR: Estimated Creatinine Clearance: 82.8 mL/min (by C-G formula based on SCr of 0.8 mg/dL). Liver Function Tests:  Recent Labs Lab 07/15/16 0816  AST 22  ALT 18  ALKPHOS 123  BILITOT 0.6  PROT 7.8  ALBUMIN 4.1   No results for input(s): LIPASE, AMYLASE in the last 168 hours. No results for input(s): AMMONIA in the last 168 hours. Coagulation Profile: No results for input(s): INR, PROTIME in the last 168 hours. Cardiac Enzymes: No results for input(s): CKTOTAL, CKMB, CKMBINDEX, TROPONINI in the last 168 hours. BNP (last 3 results) No results for input(s): PROBNP in the last 8760 hours. HbA1C: No results for input(s): HGBA1C in the last 72 hours. CBG: No results for input(s): GLUCAP in the last 168 hours. Lipid  Profile: No results for input(s): CHOL, HDL, LDLCALC, TRIG, CHOLHDL, LDLDIRECT in the last 72 hours. Thyroid Function Tests: No results for input(s): TSH, T4TOTAL, FREET4, T3FREE, THYROIDAB in the last 72 hours. Anemia Panel: No results for input(s): VITAMINB12, FOLATE, FERRITIN, TIBC, IRON, RETICCTPCT in the last 72 hours. Urine analysis:    Component Value Date/Time   COLORURINE COLORLESS (A) 07/15/2016 1020   APPEARANCEUR CLEAR 07/15/2016 1020   LABSPEC <1.005 (L) 07/15/2016 1020   PHURINE 6.0 07/15/2016 1020   GLUCOSEU NEGATIVE 07/15/2016 1020   HGBUR TRACE (A) 07/15/2016 1020   BILIRUBINUR NEGATIVE 07/15/2016 1020   KETONESUR NEGATIVE 07/15/2016  1020   PROTEINUR NEGATIVE 07/15/2016 1020   UROBILINOGEN 0.2 03/22/2010 1131   NITRITE NEGATIVE 07/15/2016 1020   LEUKOCYTESUR LARGE (A) 07/15/2016 1020    Creatinine Clearance: Estimated Creatinine Clearance: 82.8 mL/min (by C-G formula based on SCr of 0.8 mg/dL).  Sepsis Labs: @LABRCNTIP (procalcitonin:4,lacticidven:4) )No results found for this or any previous visit (from the past 240 hour(s)).   Radiological Exams on Admission: Dg Chest 2 View  Result Date: 07/15/2016 CLINICAL DATA:  Rhonchi on chest auscultation.  Smoker. EXAM: CHEST  2 VIEW COMPARISON:  Single-view of the chest 05/31/2016. PA and lateral chest 12/15/2014. CT chest 11/15/2012. FINDINGS: The chest is hyperexpanded with peribronchial thickening. A small focus of airspace opacity is seen in the right upper lobe on the frontal pain and appears new since the most recent examination. Heart size is normal. No pneumothorax or pleural effusion. IMPRESSION: Findings compatible with COPD. Small focus of airspace opacity in the right upper lobe may be atelectasis or infection. Recommend repeat PA and lateral films in 3-4 weeks to ensure resolution. Electronically Signed   By: Drusilla Kanner M.D.   On: 07/15/2016 15:02   Dg Forearm Left  Result Date: 07/15/2016 CLINICAL  DATA:  Left hand and 4 arm swelling. Complains of numbness and tingling. EXAM: LEFT FOREARM - 2 VIEW COMPARISON:  None. FINDINGS: Frontal and lateral views of the left forearm demonstrate diffuse subcutaneous soft tissue swelling of the left forearm. There is no acute displaced fracture. No significant elbow effusion. Radial head alignment normal. No radiopaque foreign body. No gas collections in the soft tissues. IMPRESSION: 1. Diffuse soft tissue swelling of the left forearm 2. No radiographic evidence for acute osseous abnormality Electronically Signed   By: Jasmine Pang M.D.   On: 07/15/2016 11:58   Dg Hand Complete Left  Result Date: 07/15/2016 CLINICAL DATA:  Left hand swelling that started a few days ago after injected hand with cocaine. For arm numbness and tingling. EXAM: LEFT HAND - COMPLETE 3+ VIEW COMPARISON:  None. FINDINGS: PA oblique and lateral views left hand. No acute displaced fracture or malalignment. Diffuse soft tissue swelling over the dorsum of the hand. No radiopaque foreign body or soft tissue gas collections. IMPRESSION: Marked swelling over the dorsum of the left hand. No acute underlying osseous abnormality. Electronically Signed   By: Jasmine Pang M.D.   On: 07/15/2016 11:57    EKG: Independently reviewed.  Assessment/Plan Active Problems:   COPD   Cellulitis of left hand   Cellulitis of hand   Trichomonosis   Polysubstance abuse   Cellulitis of hand: Likely secondary to attempted cocaine injection hand. CXR without evidence of acute prominent soft tissue swelling. Lactic acid 1.46, WBC 12.3, AF VSS - MRI to better evaluate for possible abscess (ordered by EDP) - follow up hand surgery recommendations. - BCX - IV clinda per protocol - IVF  Trichomonas: present on UA (contaminated sample). Suspect vaginal infection.  - [facility]  COPD: no acute exacerbation - continue albuterol prn  Polysubstance use: Patient's UDS positive for opiates, cocaine and THC.  Readily endorses use of drugs. 1/2ppd smoker.  - CSW for community resources - NIcotine - COntinue methadone (followed by pain clinic)  Depression/Anxiety: - continue vistaril, Abilify, Celexa  DVT prophylaxis: SCD  Code Status: full  Family Communication: none  Disposition Plan: pending improvemenmt  Consults called: Hand - Dr Amanda Pea  Admission status: inpt    Krishauna Schatzman J MD [facility]  If 7PM-7AM, please contact night-coverage [email or web address] Password  [email or web address]  07/15/2016, 5:16 PM

## 2016-07-15 NOTE — Progress Notes (Signed)
Report given to Darlene, RN

## 2016-07-15 NOTE — H&P (Signed)
Wendy Douglas is an 40 y.o. female.   Chief Complaint: Left hand infection secondary to IV drug abuse HPI: Patient is a 40 year old female who presents with a left hand infection due to IV drug abuse. She readily admits to injecting into her hand.  She has an MRI confirmed abscess and cellulitis tenosynovitis  She denies neck back chest or abdominal pain. She denies lower extremity pain she's been admitted to medicine and we're consulted to evaluate. I reviewed her chart at length and discussed all issues with the patient. She has an abscess will plan for drainage tonight acutely  Past Medical History:  Diagnosis Date  . Asthma    IN CHILDHOOD  . Chronic narcotic dependence ([place])   . COPD (chronic obstructive pulmonary disease) ([place])   . Depression   . IVDU (intravenous drug user)   . Shortness of breath   . Smoker     Past Surgical History:  Procedure Laterality Date  . CHOLECYSTECTOMY    . TUBAL LIGATION      Family History  Problem Relation Age of Onset  . Multiple sclerosis Mother   . Other Father     Suicide   Social History:  reports that she has been smoking Cigarettes.  She has been smoking about 0.50 packs per day. She has never used smokeless tobacco. She reports that she drinks about 3.6 oz of alcohol per week . She reports that she uses drugs, including IV, Cocaine, and Marijuana.  Allergies:  Allergies  Allergen Reactions  . Bee Venom Shortness Of Breath and Swelling  . Sulfa Antibiotics Rash    Medications Prior to Admission  Medication Sig Dispense Refill  . albuterol (PROVENTIL HFA;VENTOLIN HFA) 108 (90 Base) MCG/ACT inhaler Inhale 1-2 puffs into the lungs every 6 (six) hours as needed for wheezing or shortness of breath. (Patient not taking: Reported on 07/15/2016) 1 Inhaler 0  . ARIPiprazole (ABILIFY) 2 MG tablet Take 2 mg by mouth daily.    Marland Kitchen azithromycin (ZITHROMAX Z-PAK) 250 MG tablet Take 1 tablet (250 mg total) by mouth daily. Take 2 first day, then  1 for 4 days (Patient not taking: Reported on 07/15/2016) 6 each 0  . citalopram (CELEXA) 40 MG tablet Take 40 mg by mouth daily.    Marland Kitchen gabapentin (NEURONTIN) 600 MG tablet Take 300 mg by mouth 3 (three) times daily.     . hydrOXYzine (VISTARIL) 25 MG capsule Take 25 mg by mouth 3 (three) times daily.     Marland Kitchen ipratropium (ATROVENT) 0.02 % nebulizer solution Take 2.5 mLs (0.5 mg total) by nebulization 3 (three) times daily. (Patient not taking: Reported on 12/15/2014) 75 mL 0  . Iron Combinations (IRON COMPLEX PO) Take 1 tablet by mouth daily.    . methadone (DOLOPHINE) 10 MG/ML solution Take 94 mg by mouth daily.     . Multiple Vitamin (MULTIVITAMIN WITH MINERALS) TABS tablet Take 1 tablet by mouth daily.    . predniSONE (DELTASONE) 50 MG tablet Please take one pill per day 5 days. (Patient not taking: Reported on 07/15/2016) 5 tablet 0    Results for orders placed or performed during the hospital encounter of 07/15/16 (from the past 48 hour(s))  Comprehensive metabolic panel     Status: None   Collection Time: 07/15/16  8:16 AM  Result Value Ref Range   Sodium 135 135 - 145 mmol/L   Potassium 3.9 3.5 - 5.1 mmol/L   Chloride 102 101 - 111 mmol/L   CO2 22  22 - 32 mmol/L   Glucose, Bld 98 65 - 99 mg/dL   BUN 8 6 - 20 mg/dL   Creatinine, Ser 0.69 0.44 - 1.00 mg/dL   Calcium 9.2 8.9 - 10.3 mg/dL   Total Protein 7.8 6.5 - 8.1 g/dL   Albumin 4.1 3.5 - 5.0 g/dL   AST 22 15 - 41 U/L   ALT 18 14 - 54 U/L   Alkaline Phosphatase 123 38 - 126 U/L   Total Bilirubin 0.6 0.3 - 1.2 mg/dL   GFR calc non Af Amer >60 >60 mL/min   GFR calc Af Amer >60 >60 mL/min    Comment: (NOTE) The eGFR has been calculated using the CKD EPI equation. This calculation has not been validated in all clinical situations. eGFR's persistently <60 mL/min signify possible Chronic Kidney Disease.    Anion gap 11 5 - 15  CBC with Differential     Status: Abnormal   Collection Time: 07/15/16  8:16 AM  Result Value Ref Range    WBC 12.3 (H) 4.0 - 10.5 K/uL   RBC 4.45 3.87 - 5.11 MIL/uL   Hemoglobin 14.2 12.0 - 15.0 g/dL   HCT 42.4 36.0 - 46.0 %   MCV 95.3 78.0 - 100.0 fL   MCH 31.9 26.0 - 34.0 pg   MCHC 33.5 30.0 - 36.0 g/dL   RDW 14.0 11.5 - 15.5 %   Platelets 216 150 - 400 K/uL   Neutrophils Relative % 72 %   Neutro Abs 8.9 (H) 1.7 - 7.7 K/uL   Lymphocytes Relative 20 %   Lymphs Abs 2.5 0.7 - 4.0 K/uL   Monocytes Relative 7 %   Monocytes Absolute 0.8 0.1 - 1.0 K/uL   Eosinophils Relative 1 %   Eosinophils Absolute 0.1 0.0 - 0.7 K/uL   Basophils Relative 0 %   Basophils Absolute 0.0 0.0 - 0.1 K/uL  I-Stat CG4 Lactic Acid, ED     Status: None   Collection Time: 07/15/16  8:28 AM  Result Value Ref Range   Lactic Acid, Venous 1.46 0.5 - 1.9 mmol/L  I-Stat beta hCG blood, ED     Status: None   Collection Time: 07/15/16  9:17 AM  Result Value Ref Range   I-stat hCG, quantitative <5.0 <5 mIU/mL   Comment 3            Comment:   GEST. AGE      CONC.  (mIU/mL)   <=1 WEEK        5 - 50     2 WEEKS       50 - 500     3 WEEKS       100 - 10,000     4 WEEKS     1,000 - 30,000        FEMALE AND NON-PREGNANT FEMALE:     LESS THAN 5 mIU/mL   Urinalysis, Routine w reflex microscopic (not at [facility])     Status: Abnormal   Collection Time: 07/15/16 10:20 AM  Result Value Ref Range   Color, Urine COLORLESS (A) YELLOW   APPearance CLEAR CLEAR   Specific Gravity, Urine <1.005 (L) 1.005 - 1.030   pH 6.0 5.0 - 8.0   Glucose, UA NEGATIVE NEGATIVE mg/dL   Hgb urine dipstick TRACE (A) NEGATIVE   Bilirubin Urine NEGATIVE NEGATIVE   Ketones, ur NEGATIVE NEGATIVE mg/dL   Protein, ur NEGATIVE NEGATIVE mg/dL   Nitrite NEGATIVE NEGATIVE   Leukocytes, UA LARGE (A)  NEGATIVE  Rapid urine drug screen (hospital performed)     Status: Abnormal   Collection Time: 07/15/16 10:20 AM  Result Value Ref Range   Opiates POSITIVE (A) NONE DETECTED   Cocaine POSITIVE (A) NONE DETECTED   Benzodiazepines NONE DETECTED NONE DETECTED    Amphetamines NONE DETECTED NONE DETECTED   Tetrahydrocannabinol POSITIVE (A) NONE DETECTED   Barbiturates NONE DETECTED NONE DETECTED    Comment:        DRUG SCREEN FOR MEDICAL PURPOSES ONLY.  IF CONFIRMATION IS NEEDED FOR ANY PURPOSE, NOTIFY LAB WITHIN 5 DAYS.        LOWEST DETECTABLE LIMITS FOR URINE DRUG SCREEN Drug Class       Cutoff (ng/mL) Amphetamine      1000 Barbiturate      200 Benzodiazepine   664 Tricyclics       403 Opiates          300 Cocaine          300 THC              50   Urine microscopic-add on     Status: Abnormal   Collection Time: 07/15/16 10:20 AM  Result Value Ref Range   Squamous Epithelial / LPF 6-30 (A) NONE SEEN   WBC, UA 6-30 0 - 5 WBC/hpf   RBC / HPF NONE SEEN 0 - 5 RBC/hpf   Bacteria, UA MANY (A) NONE SEEN   Urine-Other TRICHOMONAS PRESENT   I-Stat CG4 Lactic Acid, ED  (not at  [facility])     Status: None   Collection Time: 07/15/16 12:09 PM  Result Value Ref Range   Lactic Acid, Venous 0.98 0.5 - 1.9 mmol/L   Dg Chest 2 View  Result Date: 07/15/2016 CLINICAL DATA:  Rhonchi on chest auscultation.  Smoker. EXAM: CHEST  2 VIEW COMPARISON:  Single-view of the chest 05/31/2016. PA and lateral chest 12/15/2014. CT chest 11/15/2012. FINDINGS: The chest is hyperexpanded with peribronchial thickening. A small focus of airspace opacity is seen in the right upper lobe on the frontal pain and appears new since the most recent examination. Heart size is normal. No pneumothorax or pleural effusion. IMPRESSION: Findings compatible with COPD. Small focus of airspace opacity in the right upper lobe may be atelectasis or infection. Recommend repeat PA and lateral films in 3-4 weeks to ensure resolution. Electronically Signed   By: Inge Rise M.D.   On: 07/15/2016 15:02   Dg Forearm Left  Result Date: 07/15/2016 CLINICAL DATA:  Left hand and 4 arm swelling. Complains of numbness and tingling. EXAM: LEFT FOREARM - 2 VIEW COMPARISON:  None. FINDINGS: Frontal  and lateral views of the left forearm demonstrate diffuse subcutaneous soft tissue swelling of the left forearm. There is no acute displaced fracture. No significant elbow effusion. Radial head alignment normal. No radiopaque foreign body. No gas collections in the soft tissues. IMPRESSION: 1. Diffuse soft tissue swelling of the left forearm 2. No radiographic evidence for acute osseous abnormality Electronically Signed   By: Donavan Foil M.D.   On: 07/15/2016 11:58   Mr Hand Left W Wo Contrast  Result Date: 07/15/2016 CLINICAL DATA:  Redness and swelling of the hand. Presents with left hand and distal left arm swelling over the past several hours. EXAM: MR OF THE LEFT WRIST WITHOUT AND WITH CONTRAST TECHNIQUE: Multiplanar multisequence MR imaging of the left wrist was performed both before and after the administration of intravenous contrast. CONTRAST:  54m MULTIHANCE GADOBENATE DIMEGLUMINE 529  MG/ML IV SOLN COMPARISON:  None. FINDINGS: Ligaments:  Intact scapholunate and lunotriquetral ligaments. Triangular fibrocartilage: Central perforation of the TFCC. Tendons: Intact flexor compartment tendons. Tenosynovitis of the extensor digitorum, extensor carpi radialis brevis and extensor carpi radialis longus. Carpal tunnel/median nerve: Normal carpal tunnel. Normal median nerve. Guyon's canal: Normal. Joint/cartilage: No joint effusion. No chondral defect. Bones/carpal alignment: No marrow signal abnormality. Normal alignment. No aggressive osseous lesion. Other: Severe soft tissue swelling enhancement of the dorsal aspect of the to lesser extent volar aspect of the hand along the thenar are eminence. Complex fluid collection infiltrating the dorsal soft tissues with the largest fluid collection measuring 2.2 x 81.4 x 0.8 cm at the level of the third-fourth mid metacarpal with a focal area extending deep to the extensor digitorum of the fourth digit at the level of the MCP joint. The fluid collection is located  both superficial and deep to the extensor digitorum tendons of the second, third, fourth and fifth digits. IMPRESSION: 1. Severe cellulitis of the dorsal aspect of the left hand with a complex infiltrative abscess along the dorsal aspect of the hand which is located both superficial and deep to the extensor digitorum tendons. Largest abscess measures 2.2 x 81.4 x 0.8 cm at the level of the third-fourth mid metacarpal with a focal area extending deep to the extensor digitorum of the fourth digit at the level of the MCP joint. 2. Tenosynovitis of the extensor digitorum, extensor carpi radialis brevis and extensor carpi radialis longus. These results were called by telephone at the time of interpretation on 07/15/2016 at 6:18 pm to Dr. Amedeo Plenty, who verbally acknowledged these results. Electronically Signed   By: Kathreen Devoid   On: 07/15/2016 18:19   Dg Hand Complete Left  Result Date: 07/15/2016 CLINICAL DATA:  Left hand swelling that started a few days ago after injected hand with cocaine. For arm numbness and tingling. EXAM: LEFT HAND - COMPLETE 3+ VIEW COMPARISON:  None. FINDINGS: PA oblique and lateral views left hand. No acute displaced fracture or malalignment. Diffuse soft tissue swelling over the dorsum of the hand. No radiopaque foreign body or soft tissue gas collections. IMPRESSION: Marked swelling over the dorsum of the left hand. No acute underlying osseous abnormality. Electronically Signed   By: Donavan Foil M.D.   On: 07/15/2016 11:57    Review of Systems  Constitutional: Negative.   HENT: Negative.   Eyes: Negative.   Respiratory: Negative.   Cardiovascular: Negative.   Gastrointestinal: Negative.   Genitourinary: Negative.   Skin: Negative.     Blood pressure 140/74, pulse 73, temperature 98.3 F (36.8 C), temperature source Oral, resp. rate 18, height _0  (1.651 m), weight 56.7 kg (125 lb), last menstrual period 07/10/2016, SpO2 100 %. Physical Exam female alert and  oriented  Left hand is tender swollen has marked inflammation and a focal area of abscess dorsally appears. There is no signs of dystrophic reaction however exam is limited due to pain  Elbow and forearm examination is fairly stable without loculated abscess but certainly she has some degree of bruising and eyes skin. There is no deep abscess in the forearm however. Opposite right upper extremity is intact.  MRIs reviewed great length by myself.  The patient is alert and oriented in no acute distress. The patient complains of pain in the affected upper extremity.  The patient is noted to have a normal HEENT exam. Lung fields show equal chest expansion and no shortness of breath. Abdomen exam is  nontender without distention. Lower extremity examination does not show any fracture dislocation or blood clot symptoms. Pelvis is stable and the neck and back are stable and nontender.  Assessment/Plan Left hand abscess  We'll plan for I and D repair reconstruction is necessary. I discussed with patient all issues and plans.  At present juncture we have discussed many issues involved in her infection planning care  All questions have been encouraged and answered   We are planning surgery for your upper extremity. The risk and benefits of surgery to include risk of bleeding, infection, anesthesia,  damage to normal structures and failure of the surgery to accomplish its intended goals of relieving symptoms and restoring function have been discussed in detail. With this in mind we plan to proceed. I have specifically discussed with the patient the pre-and postoperative regime and the dos and don'ts and risk and benefits in great detail. Risk and benefits of surgery also include risk of dystrophy(CRPS), chronic nerve pain, failure of the healing process to go onto completion and other inherent risks of surgery The relavent the pathophysiology of the disease/injury process, as well as the alternatives  for treatment and postoperative course of action has been discussed in great detail with the patient who desires to proceed.  We will do everything in our power to help you (the patient) restore function to the upper extremity. It is a pleasure to see this patient today.  Paulene Floor, MD 07/15/2016, 7:17 PM

## 2016-07-15 NOTE — Anesthesia Procedure Notes (Signed)
Procedure Name: LMA Insertion Date/Time: 07/15/2016 7:32 PM Performed by: Arlice ColtMANESS, Shuntavia Yerby B Pre-anesthesia Checklist: Patient identified, Emergency Drugs available, Suction available, Patient being monitored and Timeout performed Patient Re-evaluated:Patient Re-evaluated prior to inductionOxygen Delivery Method: Circle system utilized Preoxygenation: Pre-oxygenation with 100% oxygen Intubation Type: IV induction LMA: LMA inserted LMA Size: 4.0 Number of attempts: 1 Placement Confirmation: positive ETCO2 and breath sounds checked- equal and bilateral Tube secured with: Tape Dental Injury: Teeth and Oropharynx as per pre-operative assessment

## 2016-07-15 NOTE — Anesthesia Preprocedure Evaluation (Addendum)
Anesthesia Evaluation  Patient identified by MRN, date of birth, ID band Patient awake    Reviewed: Allergy & Precautions, NPO status , Patient's Chart, lab work & pertinent test results  History of Anesthesia Complications Negative for: history of anesthetic complications  Airway Mallampati: I  TM Distance: >3 FB Neck ROM: Full    Dental  (+) Upper Dentures, Poor Dentition, Missing, Dental Advisory Given   Pulmonary shortness of breath, asthma , COPD, Current Smoker,    breath sounds clear to auscultation       Cardiovascular negative cardio ROS   Rhythm:Regular Rate:Normal     Neuro/Psych PSYCHIATRIC DISORDERS Depression negative neurological ROS     GI/Hepatic negative GI ROS, (+)     substance abuse  cocaine use, marijuana use and IV drug use,   Endo/Other  negative endocrine ROS  Renal/GU negative Renal ROS     Musculoskeletal   Abdominal   Peds  Hematology   Anesthesia Other Findings   Reproductive/Obstetrics                            Anesthesia Physical Anesthesia Plan  ASA: III and emergent  Anesthesia Plan: General   Post-op Pain Management:    Induction: Intravenous  Airway Management Planned: LMA  Additional Equipment:   Intra-op Plan:   Post-operative Plan: Extubation in OR  Informed Consent: I have reviewed the patients History and Physical, chart, labs and discussed the procedure including the risks, benefits and alternatives for the proposed anesthesia with the patient or authorized representative who has indicated his/her understanding and acceptance.   Dental advisory given  Plan Discussed with: Anesthesiologist, Surgeon and CRNA  Anesthesia Plan Comments: (Plan routine monitors, GA- LMA OK)       Anesthesia Quick Evaluation

## 2016-07-15 NOTE — ED Notes (Signed)
Pt reports she last used Cocaine at 1445 yesterday afternoon.  Pt denies any withdrawal symptoms at this time but sts "I don't think it'll be long."

## 2016-07-15 NOTE — Anesthesia Postprocedure Evaluation (Signed)
Anesthesia Post Note  Patient: Gladstone Pihanya Wiggs  Procedure(s) Performed: Procedure(s) (LRB): IRRIGATION AND DEBRIDEMENT LEFT HAND WITH TENOSYNOVECTOMY (Left)  Patient location during evaluation: PACU Anesthesia Type: General Level of consciousness: sedated, oriented and patient cooperative Pain management: pain level controlled Vital Signs Assessment: post-procedure vital signs reviewed and stable Respiratory status: spontaneous breathing, nonlabored ventilation, respiratory function stable and patient connected to nasal cannula oxygen Cardiovascular status: blood pressure returned to baseline and stable Postop Assessment: no signs of nausea or vomiting Anesthetic complications: no    Last Vitals:  Vitals:   07/15/16 2111 07/15/16 2125  BP:  (!) 155/95  Pulse: 72 74  Resp: 16 18  Temp: 37.2 C 37.3 C    Last Pain:  Vitals:   07/15/16 2125  TempSrc: Oral  PainSc:                  Palmer Shorey,E. Kaylise Blakeley

## 2016-07-15 NOTE — ED Notes (Signed)
Pt given ice water, per Jackalyn LombardErin H, RN.

## 2016-07-16 ENCOUNTER — Encounter (HOSPITAL_COMMUNITY): Payer: Self-pay | Admitting: Orthopedic Surgery

## 2016-07-16 DIAGNOSIS — L03119 Cellulitis of unspecified part of limb: Secondary | ICD-10-CM

## 2016-07-16 DIAGNOSIS — J410 Simple chronic bronchitis: Secondary | ICD-10-CM

## 2016-07-16 DIAGNOSIS — L03114 Cellulitis of left upper limb: Principal | ICD-10-CM

## 2016-07-16 LAB — BASIC METABOLIC PANEL
ANION GAP: 6 (ref 5–15)
BUN: 8 mg/dL (ref 6–20)
CHLORIDE: 105 mmol/L (ref 101–111)
CO2: 27 mmol/L (ref 22–32)
Calcium: 8.7 mg/dL — ABNORMAL LOW (ref 8.9–10.3)
Creatinine, Ser: 0.85 mg/dL (ref 0.44–1.00)
GFR calc Af Amer: >60 mL/min (ref >60)
GFR calc non Af Amer: >60 mL/min (ref >60)
Glucose, Bld: 102 mg/dL — ABNORMAL HIGH (ref 65–99)
POTASSIUM: 3.8 mmol/L (ref 3.5–5.1)
Sodium: 138 mmol/L (ref 135–145)

## 2016-07-16 LAB — BLOOD CULTURE ID PANEL (REFLEXED)
Acinetobacter baumannii: NOT DETECTED
CANDIDA PARAPSILOSIS: NOT DETECTED
Candida albicans: NOT DETECTED
Candida glabrata: NOT DETECTED
Candida krusei: NOT DETECTED
Candida tropicalis: NOT DETECTED
ENTEROCOCCUS SPECIES: NOT DETECTED
Enterobacter cloacae complex: NOT DETECTED
Enterobacteriaceae species: NOT DETECTED
Escherichia coli: NOT DETECTED
HAEMOPHILUS INFLUENZAE: NOT DETECTED
Klebsiella oxytoca: NOT DETECTED
Klebsiella pneumoniae: NOT DETECTED
LISTERIA MONOCYTOGENES: NOT DETECTED
Neisseria meningitidis: NOT DETECTED
PROTEUS SPECIES: NOT DETECTED
PSEUDOMONAS AERUGINOSA: NOT DETECTED
SERRATIA MARCESCENS: NOT DETECTED
STAPHYLOCOCCUS AUREUS BCID: NOT DETECTED
STAPHYLOCOCCUS SPECIES: NOT DETECTED
STREPTOCOCCUS PNEUMONIAE: NOT DETECTED
STREPTOCOCCUS PYOGENES: NOT DETECTED
STREPTOCOCCUS SPECIES: NOT DETECTED
Streptococcus agalactiae: NOT DETECTED

## 2016-07-16 LAB — URINE CULTURE

## 2016-07-16 LAB — CBC
HCT: 36.7 % (ref 36.0–46.0)
HEMOGLOBIN: 11.8 g/dL — AB (ref 12.0–15.0)
MCH: 31.2 pg (ref 26.0–34.0)
MCHC: 32.2 g/dL (ref 30.0–36.0)
MCV: 97.1 fL (ref 78.0–100.0)
Platelets: 191 10*3/uL (ref 150–400)
RBC: 3.78 MIL/uL — AB (ref 3.87–5.11)
RDW: 14.3 % (ref 11.5–15.5)
WBC: 10.2 10*3/uL (ref 4.0–10.5)

## 2016-07-16 LAB — HIV ANTIBODY (ROUTINE TESTING W REFLEX): HIV Screen 4th Generation wRfx: NONREACTIVE

## 2016-07-16 MED ORDER — MORPHINE SULFATE (PF) 2 MG/ML IV SOLN
2.0000 mg | INTRAVENOUS | Status: AC
  Administered 2016-07-16 – 2016-07-17 (×6): 2 mg via INTRAVENOUS
  Filled 2016-07-16 (×6): qty 1

## 2016-07-16 MED ORDER — IPRATROPIUM-ALBUTEROL 0.5-2.5 (3) MG/3ML IN SOLN
3.0000 mL | Freq: Four times a day (QID) | RESPIRATORY_TRACT | Status: DC
  Administered 2016-07-16: 3 mL via RESPIRATORY_TRACT
  Filled 2016-07-16: qty 3

## 2016-07-16 MED ORDER — OXYCODONE HCL 5 MG PO TABS
10.0000 mg | ORAL_TABLET | Freq: Once | ORAL | Status: DC
  Filled 2016-07-16: qty 2

## 2016-07-16 MED ORDER — ADULT MULTIVITAMIN W/MINERALS CH
1.0000 | ORAL_TABLET | Freq: Every day | ORAL | Status: DC
  Administered 2016-07-16 – 2016-07-20 (×5): 1 via ORAL
  Filled 2016-07-16 (×7): qty 1

## 2016-07-16 MED ORDER — IPRATROPIUM-ALBUTEROL 0.5-2.5 (3) MG/3ML IN SOLN: 3.0000 mL | Freq: Four times a day (QID) | RESPIRATORY_TRACT | Status: DC | PRN

## 2016-07-16 MED ORDER — OXYCODONE HCL 5 MG PO TABS
ORAL_TABLET | ORAL | Status: AC
  Administered 2016-07-16: 10 mg
  Filled 2016-07-16: qty 2

## 2016-07-16 MED ORDER — NICOTINE 21 MG/24HR TD PT24
21.0000 mg | MEDICATED_PATCH | Freq: Every day | TRANSDERMAL | Status: DC
  Administered 2016-07-16 – 2016-07-22 (×7): 21 mg via TRANSDERMAL
  Filled 2016-07-16 (×7): qty 1

## 2016-07-16 NOTE — Progress Notes (Signed)
Physical Therapy Wound Treatment Patient Details  Name: Wendy Douglas MRN: [id number] Date of Birth: Aug 31, 1976  Today's Date: 07/16/2016 Time: 1220-1300 Time Calculation (min): 40 min  Subjective  Subjective: Pt states the PLS felt kind of good. Patient and Family Stated Goals: get better Date of Onset: 07/12/16 Prior Treatments: s/p surgical I&D  Pain Score: Pain Score: 2. Premedicated  Wound Assessment  Wound / Incision (Open or Dehisced) 07/16/16 Incision - Open Hand Left;Posterior;Proximal s/p I&D (Active)  Dressing Type Compression wrap;Gauze (Comment);Moist to dry 07/16/2016 12:38 PM  Dressing Changed Changed 07/16/2016 12:38 PM  Dressing Status Clean;Dry;Intact 07/16/2016 12:38 PM  Dressing Change Frequency Daily 07/16/2016 12:38 PM  Site / Wound Assessment Red;Bleeding 07/16/2016 12:38 PM  % Wound base Red or Granulating 100% 07/16/2016 12:38 PM  % Wound base Yellow 0% 07/16/2016 12:38 PM  % Wound base Black 0% 07/16/2016 12:38 PM  % Wound base Other (Comment) 0% 07/16/2016 12:38 PM  Peri-wound Assessment Erythema (blanchable);Edema 07/16/2016 12:38 PM  Wound Length (cm) 1.5 cm 07/16/2016 12:38 PM  Wound Width (cm) 0.5 cm 07/16/2016 12:38 PM  Wound Depth (cm) 0.3 cm 07/16/2016 12:38 PM  Margins Unattached edges (unapproximated) 07/16/2016 12:38 PM  Closure None 07/16/2016 12:38 PM  Drainage Amount Moderate 07/16/2016 12:38 PM  Drainage Description Serosanguineous 07/16/2016 12:38 PM  Treatment Hydrotherapy (Pulse lavage);Packing (Saline gauze) 07/16/2016 12:38 PM     Wound / Incision (Open or Dehisced) 07/16/16 Incision - Open Hand Left;Posterior;Mid s/p I&D (Active)  Dressing Type Compression wrap;Gauze (Comment);Moist to dry 07/16/2016 12:38 PM  Dressing Changed Changed 07/16/2016 12:38 PM  Dressing Status Clean;Dry;Intact 07/16/2016 12:38 PM  Dressing Change Frequency Daily 07/16/2016 12:38 PM  Site / Wound Assessment Red;Bleeding 07/16/2016 12:38 PM  % Wound base Red or Granulating  100% 07/16/2016 12:38 PM  % Wound base Yellow 0% 07/16/2016 12:38 PM  % Wound base Black 0% 07/16/2016 12:38 PM  % Wound base Other (Comment) 0% 07/16/2016 12:38 PM  Peri-wound Assessment Edema;Erythema (blanchable) 07/16/2016 12:38 PM  Wound Length (cm) 1.5 cm 07/16/2016 12:38 PM  Wound Width (cm) 0.5 cm 07/16/2016 12:38 PM  Wound Depth (cm) 0.3 cm 07/16/2016 12:38 PM  Margins Unattached edges (unapproximated) 07/16/2016 12:38 PM  Closure None 07/16/2016 12:38 PM  Drainage Amount Moderate 07/16/2016 12:38 PM  Drainage Description Serosanguineous 07/16/2016 12:38 PM  Treatment Hydrotherapy (Pulse lavage);Packing (Saline gauze) 07/16/2016 12:38 PM     Wound / Incision (Open or Dehisced) 07/16/16 Incision - Open Hand Left;Posterior;Distal s/p I&D (Active)  Dressing Type Compression wrap;Gauze (Comment);Moist to dry 07/16/2016 12:38 PM  Dressing Changed Changed 07/16/2016 12:38 PM  Dressing Status Clean;Dry;Intact 07/16/2016 12:38 PM  Dressing Change Frequency Daily 07/16/2016 12:38 PM  Site / Wound Assessment Red;Bleeding 07/16/2016 12:38 PM  % Wound base Red or Granulating 100% 07/16/2016 12:38 PM  % Wound base Yellow 0% 07/16/2016 12:38 PM  % Wound base Black 0% 07/16/2016 12:38 PM  % Wound base Other (Comment) 0% 07/16/2016 12:38 PM  Peri-wound Assessment Edema;Erythema (blanchable) 07/16/2016 12:38 PM  Wound Length (cm) 3 cm 07/16/2016 12:38 PM  Wound Width (cm) 0.3 cm 07/16/2016 12:38 PM  Wound Depth (cm) 0.3 cm 07/16/2016 12:38 PM  Margins Unattached edges (unapproximated) 07/16/2016 12:38 PM  Closure Surface sutures 07/16/2016 12:38 PM  Drainage Amount Moderate 07/16/2016 12:38 PM  Drainage Description Serosanguineous 07/16/2016 12:38 PM  Treatment Hydrotherapy (Pulse lavage);Packing (Saline gauze) 07/16/2016 12:38 PM   Hydrotherapy Pulsed lavage therapy - wound location: dorsum of lt hand Pulsed Lavage with Suction (  psi): 4 psi Pulsed Lavage with Suction - Normal Saline Used: 1000 mL Pulsed Lavage Tip:  Tip with splash shield   Wound Assessment and Plan  Wound Therapy - Assess/Plan/Recommendations Wound Therapy - Clinical Statement: Pt presents with open wounds s/p I&D. Can benefit from hydrotherapy to cleanse wounds and decr bioburden.  Wound Therapy - Functional Problem List: Decr use of left hand Factors Delaying/Impairing Wound Healing: Infection - systemic/local;Substance abuse;Tobacco use Hydrotherapy Plan: Dressing change;Patient/family education;Pulsatile lavage with suction Wound Therapy - Frequency: 6X / week Wound Therapy - Follow Up Recommendations: Other (comment) (Per MD. Likely self dressing change.) Wound Plan: See above  Wound Therapy Goals- Improve the function of patient's integumentary system by progressing the wound(s) through the phases of wound healing (inflammation - proliferation - remodeling) by: Decrease Length/Width/Depth by (cm): depth by .1 Decrease Length/Width/Depth - Progress: Goal set today Improve Drainage Characteristics: Min Improve Drainage Characteristics - Progress: Goal set today  Goals will be updated until maximal potential achieved or discharge criteria met.  Discharge criteria: when goals achieved, discharge from hospital, MD decision/surgical intervention, no progress towards goals, refusal/missing three consecutive treatments without notification or medical reason.  GP     Lurie Mullane 07/16/2016, 1:33 PM [place]

## 2016-07-16 NOTE — Progress Notes (Signed)
Initial Nutrition Assessment  DOCUMENTATION CODES:   Non-severe (moderate) malnutrition in context of social or environmental circumstances  INTERVENTION:   -Continue Ensure Enlive po BID, each supplement provides 350 kcal and 20 grams of protein -MVI daily  NUTRITION DIAGNOSIS:   Malnutrition related to social / environmental circumstances as evidenced by mild depletion of body fat, moderate depletion of body fat, mild depletion of muscle mass, moderate depletions of muscle mass.  GOAL:   Patient will meet greater than or equal to 90% of their needs  MONITOR:   PO intake, Supplement acceptance, Labs, Weight trends, Skin, I & O's  REASON FOR ASSESSMENT:   Malnutrition Screening Tool    ASSESSMENT:   Patient is a 40 year old female who presents with a left hand infection due to IV drug abuse. She has an MRI confirmed abscess and cellulitis tenosynovitis  S/p Procedure(s) (LRB) on 07/15/16: IRRIGATION AND DEBRIDEMENT LEFT HAND WITH TENOSYNOVECTOMY (Left)  Spoke with pt at bedside. She reports good appetite both currently and PTA. She shares that she consumed 100% of her breakfast tray this morning and is eagerly awaiting lunch tray. Also noted pt consumed 100% of Ensure supplement on tray table. Pt reveals she likes these supplements and has consumed in the past.   Pt shares with this RD that she typically consumed about one meal per day. Upon further questioning, RD suspects that pt may not have adequate access to foods ("I eat when I can, when I can get it"). Pt also endorses wt loss over a 6 month period, revealing UBW around 145#, which is consistent with hx hx. However, no wt readings available within the past year to confirm pt statement.   Nutrition-Focused physical exam completed. Findings are mild to moderate fat depletion, mild to moderate muscle depletion, and no edema.   Pt with hx of IV drug use. Noted toxicology screen positive for opiates, cocaine, and cannabis.    Discussed importance of good meal and supplement intake to promote healing. Pt amenable to continue Ensure supplements. She denied any further concerns, but expressed appreciation for visit.   Labs reviewed.   Diet Order:  Diet regular Room service appropriate? Yes; Fluid consistency: Thin  Skin:  Wound (see comment) (open lt hand iscision s/p I&D)  Last BM:  07/13/16  Height:   Ht Readings from Last 1 Encounters:  07/15/16 5\' 5"  (1.651 m)    Weight:   Wt Readings from Last 1 Encounters:  07/15/16 125 lb (56.7 kg)    Ideal Body Weight:  56.8 kg  BMI:  Body mass index is 20.8 kg/m.  Estimated Nutritional Needs:   Kcal:  1600-1800  Protein:  75-90 grams  Fluid:  1.6-1.8 L  EDUCATION NEEDS:   Education needs addressed  Kenia Teagarden A. Mayford KnifeWilliams, RD, LDN, CDE Pager: [phone] After hours Pager: [phone]

## 2016-07-16 NOTE — Op Note (Signed)
Wendy Douglas, Wendy Douglas NO.:  [id number]  MEDICAL RECORD NO.:  [id number]  LOCATION:  [id number]                        FACILITY:  [facility]  PHYSICIAN:  Dionne Ano. Keyonta Barradas, M.D.DATE OF BIRTH:  25-Aug-1976  DATE OF PROCEDURE: DATE OF DISCHARGE:                              OPERATIVE REPORT   PREOPERATIVE DIAGNOSES:  History of IV heroin abuse and dorsal left hand abscess with infectious tenosynovitis.  POSTOPERATIVE DIAGNOSES:  History of IV heroin abuse and dorsal left hand abscess with infectious tenosynovitis.  PROCEDURES: 1. Irrigation and debridement of deep abscess, skin, subcutaneous     tissue, tendon, muscle and bone/periosteum, underwent excisional     debridement with curette, knife and scissor.  This was an 8-cm in     length to various pie-crusted incisions and 2-3 cm in depth,     irrigation and debridement. 2. Radical tenosynovectomy of the extensor apparatus involving the     second, fourth, fifth compartments, dorsal hand, wrist and forearm.  SURGEON:  Dionne Ano. Amanda Pea, M.D.  ASSISTANT:  Karie Chimera, P.A.-C.  COMPLICATIONS:  None.  ANESTHESIA:  General.  TOURNIQUET TIME:  Less than an hour.  INDICATIONS:  Very challenging patient with the above-mentioned diagnosis.  I have counseled her in regard to risks and benefits of surgery and she desires to proceed.  OPERATION IN DETAIL:  The patient was seen by myself and Anesthesia, taken to the operative theater and underwent smooth induction of general anesthesia, prepped and draped in usual sterile fashion with Betadine scrub and paint, after a pre-scrub with chlorhexidine was accomplished. Time-out was called.  Arm was elevated.  Incision was made over the third dorsal webspace.  Dissection was carried down.  I then identified, cultured the area for fluid cultures of aerobic, anaerobic, atypical, mycobacterial and fungal cultures.  Following this, we decompressed an abscess and then performed  very careful and cautious approach to the extremity with tissue cultures of an inflammatory peel about the tendons and periosteal tissue.  I skewered the dorsal aspect of the hand to make sure that the patient had a complete decompression of the area.  I then made two incisions, one of the distal third of the forearm and one of the wrist.  Through these pie- crusted type incisions, we performed an extensor retinaculum release followed by an extensive/radical tenosynovectomy of the second, fourth, and fifth dorsal compartments.  Tenosynovectomy was accomplished about the extensor apparatus.  Following radical tenosynovectomy and release of the retinaculum, the patient then had 3 liters of saline placed through and through the wounds and multiple Penrose drains placed through the three incisions. The patient tolerated this well.  She had good pink hand at the conclusion of the case.  I performed a gentle manipulation of the MCP joints.  We will consult Social Services begin hydrotherapy to mark each wet-to- dry dressing changes.  This patient does not have a home at present time and has tremendous challenges including the IV drug abuse.  Thus, I do feel this is going to be quite difficult to get her back to complete resolved state with a brief admission and would expect more of a prolonged hospital admission. We will await  cultures and move forward accordingly, elevate, move and massage fingers, IV antibiotics, serial debridement on a p.r.n. basis. Do's and don'ts have been discussed and all questions have been encouraged and answered.  Pleasure to participate in her care.     Dionne AnoWilliam M. Amanda PeaGramig, M.D.     [facility]/MEDQ  D:  07/15/2016  T:  07/16/2016  Job:  [id number]

## 2016-07-16 NOTE — Progress Notes (Signed)
  PHARMACY - PHYSICIAN COMMUNICATION CRITICAL VALUE ALERT - BLOOD CULTURE IDENTIFICATION (BCID)  Results for orders placed or performed during the hospital encounter of 07/15/16  Blood Culture ID Panel (Reflexed) (Collected: 07/15/2016  9:00 AM)  Result Value Ref Range   Enterococcus species NOT DETECTED NOT DETECTED   Listeria monocytogenes NOT DETECTED NOT DETECTED   Staphylococcus species NOT DETECTED NOT DETECTED   Staphylococcus aureus NOT DETECTED NOT DETECTED   Streptococcus species NOT DETECTED NOT DETECTED   Streptococcus agalactiae NOT DETECTED NOT DETECTED   Streptococcus pneumoniae NOT DETECTED NOT DETECTED   Streptococcus pyogenes NOT DETECTED NOT DETECTED   Acinetobacter baumannii NOT DETECTED NOT DETECTED   Enterobacteriaceae species NOT DETECTED NOT DETECTED   Enterobacter cloacae complex NOT DETECTED NOT DETECTED   Escherichia coli NOT DETECTED NOT DETECTED   Klebsiella oxytoca NOT DETECTED NOT DETECTED   Klebsiella pneumoniae NOT DETECTED NOT DETECTED   Proteus species NOT DETECTED NOT DETECTED   Serratia marcescens NOT DETECTED NOT DETECTED   Haemophilus influenzae NOT DETECTED NOT DETECTED   Neisseria meningitidis NOT DETECTED NOT DETECTED   Pseudomonas aeruginosa NOT DETECTED NOT DETECTED   Candida albicans NOT DETECTED NOT DETECTED   Candida glabrata NOT DETECTED NOT DETECTED   Candida krusei NOT DETECTED NOT DETECTED   Candida parapsilosis NOT DETECTED NOT DETECTED   Candida tropicalis NOT DETECTED NOT DETECTED    Name of physician (or Provider) Contacted: Dr. Isidoro Donningai  Changes to prescribed antibiotics required: No changes at this time. Continue with Clindamycin alone and follow-up cultures.   Link SnufferJessica Ilhan Debenedetto, PharmD, BCPS Clinical Pharmacist [phone] 07/16/2016  4:47 PM

## 2016-07-16 NOTE — Progress Notes (Signed)
Subjective: 1 Day Post-Op Procedure(s) (LRB): IRRIGATION AND DEBRIDEMENT LEFT HAND WITH TENOSYNOVECTOMY (Left) Patient reports pain as fairly controlled. She denies nausea or vomiting. Pain reported to lue as expected.    Objective: Vital signs in last 24 hours: Temp:  [97.7 F (36.5 C)-99.2 F (37.3 C)] 98.9 F (37.2 C) (09/13 0558) Pulse Rate:  [67-93] 78 (09/13 0558) Resp:  [14-23] 16 (09/13 0558) BP: (115-168)/(63-120) 145/89 (09/13 0558) SpO2:  [94 %-100 %] 98 % (09/13 0902) Weight:  [56.7 kg (125 lb)] 56.7 kg (125 lb) (09/12 1831)  Intake/Output from previous day: 09/12 0701 - 09/13 0700 In: 2250 [P.O.:960; I.V.:1190; IV Piggyback:100] Out: 1505 [Urine:1500; Blood:5] Intake/Output this shift: No intake/output data recorded.   Recent Labs  07/15/16 0816 07/16/16 0315  HGB 14.2 11.8*    Recent Labs  07/15/16 0816 07/16/16 0315  WBC 12.3* 10.2  RBC 4.45 3.78*  HCT 42.4 36.7  PLT 216 191    Recent Labs  07/15/16 0816 07/16/16 0315  NA 135 138  K 3.9 3.8  CL 102 105  CO2 22 27  BUN 8 8  CREATININE 0.69 0.85  GLUCOSE 98 102*  CALCIUM 9.2 8.7*   No results for input(s): LABPT, INR in the last 72 hours. Results for orders placed or performed during the hospital encounter of 07/15/16  Surgical pcr screen     Status: Abnormal   Collection Time: 07/15/16  6:27 PM  Result Value Ref Range Status   MRSA, PCR POSITIVE (A) NEGATIVE Final   Staphylococcus aureus POSITIVE (A) NEGATIVE Final    Comment:        The Xpert SA Assay (FDA approved for NASAL specimens in patients over 42 years of age), is one component of a comprehensive surveillance program.  Test performance has been validated by [facility] for patients greater than or equal to 313 year old. It is not intended to diagnose infection nor to guide or monitor treatment. CRITICAL RESULT CALLED TO, READ BACK BY AND VERIFIED WITH: Billy Coast. TULIA, RN AT 2027 ON 07/15/16 BY C. JESSUP, MLT.    Aerobic/Anaerobic Culture (surgical/deep wound)     Status: None (Preliminary result)   Collection Time: 07/15/16  7:51 PM  Result Value Ref Range Status   Specimen Description ABSCESS LEFT HAND  Final   Special Requests PT ON FLAGYL DORSAL LEFT HAND SWAB  Final   Gram Stain   Final    FEW WBC PRESENT,BOTH PMN AND MONONUCLEAR FEW GRAM POSITIVE COCCI IN CLUSTERS    Culture PENDING  Incomplete   Report Status PENDING  Incomplete  Aerobic/Anaerobic Culture (surgical/deep wound)     Status: None (Preliminary result)   Collection Time: 07/15/16  7:54 PM  Result Value Ref Range Status   Specimen Description TISSUE LEFT HAND  Final   Special Requests DOSAL  PT ON FLAGYL  Final   Gram Stain   Final    MODERATE WBC PRESENT,BOTH PMN AND MONONUCLEAR RARE GRAM POSITIVE COCCI IN PAIRS    Culture PENDING  Incomplete   Report Status PENDING  Incomplete   LUE: Dsg removed, drains removed, serosanginous d/c noted, mild erythema, no significant ascending cellulitis, digits with decreased rom, no kanavels  Assessment/Plan: 1 Day Post-Op Procedure(s) (LRB): IRRIGATION AND DEBRIDEMENT LEFT HAND WITH TENOSYNOVECTOMY (Left) Wounds are irrigated and repacked, plan for formal hydrotherapy later today. Continue IV ABX and await final cultures. Encourage frequent attempts at digital rom and continued edema control.We have discussed with the patient the issues regarding their  infection to the extremity. We will continue antibiotics and await culture results. Often times it will take 3-5 days for cultures to become final. During this time we will typically have the patient on intravenous antibiotics until we can find a parenteral route of antibiotic regime specific for the bacteria or organism isolated. We have discussed with the patient the need for daily irrigation and debridement as well as therapy to the area. We have discussed with the patient the necessity of range of motion to the involved joints as  discussed today. We have discussed with the patient the unpredictability of infections at times. We'll continue to work towards good pain control and restoration of function. The patient understands the need for meticulous wound care and the necessity of proper followup.  The possible complications of stiffness (loss of motion), resistant infection, possible deep bone infection, possible chronic pain issues, possible need for multiple surgeries and even amputation.  With this in mind the patient understands our goal is to eradicate the infection to quiesence. We will continue to work towards these goals.  Skye Plamondon L 07/16/2016, 9:35 AM

## 2016-07-16 NOTE — Progress Notes (Signed)
[facility]                                                                              Patient Demographics  Wendy Douglas, is a 40 y.o. female, DOB - 09/18/1976, ZOX:[id number]  Admit date - 07/15/2016   Admitting Physician Ozella Rocks, MD  Outpatient Primary MD for the patient is No PCP Per Patient  Outpatient specialists:   LOS - 1  days    Chief Complaint  Patient presents with  . Arm Swelling  . Hand Injury       Brief summary   Wendy Douglas is a 40 y.o. female with medical history significant of COPD, polysubstance abuse/IVDU, presenting with left hand swelling pain in the redness. Started 1 day Prior to admission, the patient reported  attempted cocaine injection between the fourth and fifth fingers on the dorsum of the hand 3 days ago. States that this did not go into the vein directly. Symptoms are constant and getting worse. Denies fevers, chills, neck stiffness, headache, palpitations, chest pain, shortness of breath, nausea, vomiting, diarrhea, dysuria, frequency, vaginal discharge, back pain.    Assessment & Plan   Cellulitis and abscess of hand: Likely secondary to attempted cocaine injection hand.  - MRI showed severe cellulitis of the dorsal aspect of the left hand with complex infiltrative abscess located both superficial and deep to the extensor digitorum tendons, largest abscess 2.2 x 8 x 0.8 centimeter and infectious tenosynovitis. - Hand surgery was consulted, appreciate Dr. Carlos Levering assistance. Patient underwentirrigation and debridement of the deep abscess and radical tenosynovectomy - Management per hand surgery  Trichomonas: present on UA (contaminated sample). Suspect vaginal infection.  - Continue metronidazole  COPD: With slight wheezing - continue albuterol   Polysubstance use: Patient's UDS positive for opiates, cocaine and THC. 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 Will place on scheduled morphine for today and if  patient goes into withdrawals, will need to be on clonidine detox protocol - Patient apparently is also on methadone per pain clinic, will not change dose of methadone  Nicotine abuse: - Patient smokes half pack per day. - Placed on nicotine patch  - Continue methadone (followed by pain clinic)  Depression/Anxiety: - continue vistaril, Abilify, Celexa   Code Status: Full code DVT Prophylaxis:  SCD's Family Communication: Discussed in detail with the patient, all imaging results, lab results explained to the patient    Disposition Plan:   Time Spent in minutes 25 minutes  Procedures:  PROCEDURES: 1. Irrigation and debridement of deep abscess, skin, subcutaneous     tissue, tendon, muscle and bone/periosteum, underwent excisional     debridement with curette, knife and scissor.  This was an 8-cm in     length to various pie-crusted incisions and 2-3 cm in depth,     irrigation and debridement. 2. Radical tenosynovectomy of the extensor apparatus involving the     second, fourth, fifth compartments, dorsal hand, wrist and forearm  Consultants:   Hand surgery, Dr Amanda Pea  Antimicrobials:   IV clindamycin   Medications  Scheduled Meds: . Chlorhexidine Gluconate Cloth  6 each Topical Q0600  .  clindamycin (CLEOCIN) IV  600 mg Intravenous Q8H  . feeding supplement (ENSURE ENLIVE)  237 mL Oral BID BM  . hydrOXYzine  25 mg Oral TID  . Influenza vac split quadrivalent PF  0.5 mL Intramuscular Tomorrow-1000  . ipratropium-albuterol  3 mL Nebulization Once  . metroNIDAZOLE  500 mg Oral Q12H  .  morphine injection  2 mg Intravenous Q4H  . mupirocin ointment  1 application Nasal BID  . nicotine  21 mg Transdermal Daily  . oxyCODONE  10 mg Oral Once   Continuous Infusions: . sodium chloride 100 mL/hr at 07/16/16 0618   PRN Meds:.acetaminophen **OR** acetaminophen, ipratropium-albuterol, ketorolac, ondansetron **OR** ondansetron (ZOFRAN) IV, oxyCODONE   Antibiotics    Anti-infectives    Start     Dose/Rate Route Frequency Ordered Stop   07/16/16 1000  metroNIDAZOLE (FLAGYL) tablet 500 mg     500 mg Oral Every 12 hours 07/15/16 1847 07/23/16 0959   07/15/16 2200  metroNIDAZOLE (FLAGYL) tablet 500 mg  Status:  Discontinued     500 mg Oral Every 12 hours 07/15/16 1718 07/15/16 1847   07/15/16 2130  vancomycin (VANCOCIN) IVPB 750 mg/150 ml premix  Status:  Discontinued     750 mg 150 mL/hr over 60 Minutes Intravenous Every 12 hours 07/15/16 0904 07/15/16 1841   07/15/16 1730  clindamycin (CLEOCIN) IVPB 600 mg     600 mg 100 mL/hr over 30 Minutes Intravenous Every 8 hours 07/15/16 1718     07/15/16 1130  metroNIDAZOLE (FLAGYL) tablet 2,000 mg     2,000 mg Oral  Once 07/15/16 1128 07/15/16 1135   07/15/16 0930  vancomycin (VANCOCIN) IVPB 1000 mg/200 mL premix     1,000 mg 200 mL/hr over 60 Minutes Intravenous  Once 07/15/16 0904 07/15/16 1051        Subjective:   Wendy Douglas Motl was seen and examined today. Left arm in the splint/immobilizer. Patient denies dizziness, chest pain, shortness of breath, abdominal pain, N/V/D/C, new weakness, numbess, tingling. No acute events overnight.  Pain is controlled  Objective:   Vitals:   07/16/16 0315 07/16/16 0558 07/16/16 0902 07/16/16 1000  BP: 119/63 (!) 145/89  125/70  Pulse: 74 78  69  Resp:  16  16  Temp:  98.9 F (37.2 C)  99 F (37.2 C)  TempSrc:  Oral  Oral  SpO2:  98% 98% 98%  Weight:      Height:        Intake/Output Summary (Last 24 hours) at 07/16/16 1306 Last data filed at 07/16/16 0947  Gross per 24 hour  Intake             2250 ml  Output             1955 ml  Net              295 ml     Wt Readings from Last 3 Encounters:  07/15/16 56.7 kg (125 lb)  05/31/16 54.4 kg (120 lb)  12/15/14 68 kg (150 lb)     Exam  General: Alert and oriented x 3, NAD  HEENT:  PERRLA, EOMI, Anicteric Sclera, mucous membranes moist.   Neck: Supple, no JVD, no masses  Cardiovascular: S1  S2 auscultated, no rubs, murmurs or gallops. Regular rate and rhythm.  Respiratory: Clear to auscultation bilaterally, no wheezing, rales or rhonchi  Gastrointestinal: Soft, nontender, nondistended, + bowel sounds  Ext: no cyanosis clubbing or edema. Left hand with dressing intact  Neuro: AAOx3,  Cr N's II- XII. Strength 5/5 upper and lower extremities bilaterally  Skin: No rashes  Psych: Normal affect and demeanor, alert and oriented x3    Data Reviewed:  I have personally reviewed following labs and imaging studies  Micro Results Recent Results (from the past 240 hour(s))  Surgical pcr screen     Status: Abnormal   Collection Time: 07/15/16  6:27 PM  Result Value Ref Range Status   MRSA, PCR POSITIVE (A) NEGATIVE Final   Staphylococcus aureus POSITIVE (A) NEGATIVE Final    Comment:        The Xpert SA Assay (FDA approved for NASAL specimens in patients over 24 years of age), is one component of a comprehensive surveillance program.  Test performance has been validated by [facility] for patients greater than or equal to 71 year old. It is not intended to diagnose infection nor to guide or monitor treatment. CRITICAL RESULT CALLED TO, READ BACK BY AND VERIFIED WITH: CBilly Coast, RN AT 2027 ON 07/15/16 BY C. JESSUP, MLT.   Aerobic/Anaerobic Culture (surgical/deep wound)     Status: None (Preliminary result)   Collection Time: 07/15/16  7:51 PM  Result Value Ref Range Status   Specimen Description ABSCESS LEFT HAND  Final   Special Requests PT ON FLAGYL DORSAL LEFT HAND SWAB  Final   Gram Stain   Final    FEW WBC PRESENT,BOTH PMN AND MONONUCLEAR FEW GRAM POSITIVE COCCI IN CLUSTERS    Culture CULTURE REINCUBATED FOR BETTER GROWTH  Final   Report Status PENDING  Incomplete  Aerobic/Anaerobic Culture (surgical/deep wound)     Status: None (Preliminary result)   Collection Time: 07/15/16  7:54 PM  Result Value Ref Range Status   Specimen Description TISSUE LEFT HAND  Final    Special Requests DOSAL  PT ON FLAGYL  Final   Gram Stain   Final    MODERATE WBC PRESENT,BOTH PMN AND MONONUCLEAR RARE GRAM POSITIVE COCCI IN PAIRS    Culture CULTURE REINCUBATED FOR BETTER GROWTH  Final   Report Status PENDING  Incomplete    Radiology Reports Dg Chest 2 View  Result Date: 07/15/2016 CLINICAL DATA:  Rhonchi on chest auscultation.  Smoker. EXAM: CHEST  2 VIEW COMPARISON:  Single-view of the chest 05/31/2016. PA and lateral chest 12/15/2014. CT chest 11/15/2012. FINDINGS: The chest is hyperexpanded with peribronchial thickening. A small focus of airspace opacity is seen in the right upper lobe on the frontal pain and appears new since the most recent examination. Heart size is normal. No pneumothorax or pleural effusion. IMPRESSION: Findings compatible with COPD. Small focus of airspace opacity in the right upper lobe may be atelectasis or infection. Recommend repeat PA and lateral films in 3-4 weeks to ensure resolution. Electronically Signed   By: Drusilla Kanner M.D.   On: 07/15/2016 15:02   Dg Forearm Left  Result Date: 07/15/2016 CLINICAL DATA:  Left hand and 4 arm swelling. Complains of numbness and tingling. EXAM: LEFT FOREARM - 2 VIEW COMPARISON:  None. FINDINGS: Frontal and lateral views of the left forearm demonstrate diffuse subcutaneous soft tissue swelling of the left forearm. There is no acute displaced fracture. No significant elbow effusion. Radial head alignment normal. No radiopaque foreign body. No gas collections in the soft tissues. IMPRESSION: 1. Diffuse soft tissue swelling of the left forearm 2. No radiographic evidence for acute osseous abnormality Electronically Signed   By: Jasmine Pang M.D.   On: 07/15/2016 11:58   Mr Hand Left W  Wo Contrast  Result Date: 07/15/2016 CLINICAL DATA:  Redness and swelling of the hand. Presents with left hand and distal left arm swelling over the past several hours. EXAM: MR OF THE LEFT WRIST WITHOUT AND WITH CONTRAST  TECHNIQUE: Multiplanar multisequence MR imaging of the left wrist was performed both before and after the administration of intravenous contrast. CONTRAST:  10mL MULTIHANCE GADOBENATE DIMEGLUMINE 529 MG/ML IV SOLN COMPARISON:  None. FINDINGS: Ligaments:  Intact scapholunate and lunotriquetral ligaments. Triangular fibrocartilage: Central perforation of the TFCC. Tendons: Intact flexor compartment tendons. Tenosynovitis of the extensor digitorum, extensor carpi radialis brevis and extensor carpi radialis longus. Carpal tunnel/median nerve: Normal carpal tunnel. Normal median nerve. Guyon's canal: Normal. Joint/cartilage: No joint effusion. No chondral defect. Bones/carpal alignment: No marrow signal abnormality. Normal alignment. No aggressive osseous lesion. Other: Severe soft tissue swelling enhancement of the dorsal aspect of the to lesser extent volar aspect of the hand along the thenar are eminence. Complex fluid collection infiltrating the dorsal soft tissues with the largest fluid collection measuring 2.2 x 81.4 x 0.8 cm at the level of the third-fourth mid metacarpal with a focal area extending deep to the extensor digitorum of the fourth digit at the level of the MCP joint. The fluid collection is located both superficial and deep to the extensor digitorum tendons of the second, third, fourth and fifth digits. IMPRESSION: 1. Severe cellulitis of the dorsal aspect of the left hand with a complex infiltrative abscess along the dorsal aspect of the hand which is located both superficial and deep to the extensor digitorum tendons. Largest abscess measures 2.2 x 81.4 x 0.8 cm at the level of the third-fourth mid metacarpal with a focal area extending deep to the extensor digitorum of the fourth digit at the level of the MCP joint. 2. Tenosynovitis of the extensor digitorum, extensor carpi radialis brevis and extensor carpi radialis longus. These results were called by telephone at the time of interpretation on  07/15/2016 at 6:18 pm to Dr. Amanda Pea, who verbally acknowledged these results. Electronically Signed   By: Elige Ko   On: 07/15/2016 18:19   Dg Hand Complete Left  Result Date: 07/15/2016 CLINICAL DATA:  Left hand swelling that started a few days ago after injected hand with cocaine. For arm numbness and tingling. EXAM: LEFT HAND - COMPLETE 3+ VIEW COMPARISON:  None. FINDINGS: PA oblique and lateral views left hand. No acute displaced fracture or malalignment. Diffuse soft tissue swelling over the dorsum of the hand. No radiopaque foreign body or soft tissue gas collections. IMPRESSION: Marked swelling over the dorsum of the left hand. No acute underlying osseous abnormality. Electronically Signed   By: Jasmine Pang M.D.   On: 07/15/2016 11:57    Lab Data:  CBC:  Recent Labs Lab 07/15/16 0816 07/16/16 0315  WBC 12.3* 10.2  NEUTROABS 8.9*  --   HGB 14.2 11.8*  HCT 42.4 36.7  MCV 95.3 97.1  PLT 216 191   Basic Metabolic Panel:  Recent Labs Lab 07/15/16 0816 07/16/16 0315  NA 135 138  K 3.9 3.8  CL 102 105  CO2 22 27  GLUCOSE 98 102*  BUN 8 8  CREATININE 0.69 0.85  CALCIUM 9.2 8.7*   GFR: Estimated Creatinine Clearance: 78 mL/min (by C-G formula based on SCr of 0.85 mg/dL). Liver Function Tests:  Recent Labs Lab 07/15/16 0816  AST 22  ALT 18  ALKPHOS 123  BILITOT 0.6  PROT 7.8  ALBUMIN 4.1   No results for  input(s): LIPASE, AMYLASE in the last 168 hours. No results for input(s): AMMONIA in the last 168 hours. Coagulation Profile: No results for input(s): INR, PROTIME in the last 168 hours. Cardiac Enzymes: No results for input(s): CKTOTAL, CKMB, CKMBINDEX, TROPONINI in the last 168 hours. BNP (last 3 results) No results for input(s): PROBNP in the last 8760 hours. HbA1C: No results for input(s): HGBA1C in the last 72 hours. CBG: No results for input(s): GLUCAP in the last 168 hours. Lipid Profile: No results for input(s): CHOL, HDL, LDLCALC, TRIG,  CHOLHDL, LDLDIRECT in the last 72 hours. Thyroid Function Tests: No results for input(s): TSH, T4TOTAL, FREET4, T3FREE, THYROIDAB in the last 72 hours. Anemia Panel: No results for input(s): VITAMINB12, FOLATE, FERRITIN, TIBC, IRON, RETICCTPCT in the last 72 hours. Urine analysis:    Component Value Date/Time   COLORURINE COLORLESS (A) 07/15/2016 1020   APPEARANCEUR CLEAR 07/15/2016 1020   LABSPEC <1.005 (L) 07/15/2016 1020   PHURINE 6.0 07/15/2016 1020   GLUCOSEU NEGATIVE 07/15/2016 1020   HGBUR TRACE (A) 07/15/2016 1020   BILIRUBINUR NEGATIVE 07/15/2016 1020   KETONESUR NEGATIVE 07/15/2016 1020   PROTEINUR NEGATIVE 07/15/2016 1020   UROBILINOGEN 0.2 03/22/2010 1131   NITRITE NEGATIVE 07/15/2016 1020   LEUKOCYTESUR LARGE (A) 07/15/2016 1020     Obediah Welles M.D. [facility] 07/16/2016, 1:06 PM  Pager: [phone] Between 7am to 7pm - call Pager - [phone]  After 7pm go to [email or web address] - password [email or web address]  Call night coverage person covering after 7pm

## 2016-07-17 DIAGNOSIS — A599 Trichomoniasis, unspecified: Secondary | ICD-10-CM

## 2016-07-17 DIAGNOSIS — L02512 Cutaneous abscess of left hand: Secondary | ICD-10-CM

## 2016-07-17 DIAGNOSIS — F1910 Other psychoactive substance abuse, uncomplicated: Secondary | ICD-10-CM

## 2016-07-17 DIAGNOSIS — Z9889 Other specified postprocedural states: Secondary | ICD-10-CM

## 2016-07-17 DIAGNOSIS — B9562 Methicillin resistant Staphylococcus aureus infection as the cause of diseases classified elsewhere: Secondary | ICD-10-CM

## 2016-07-17 LAB — BASIC METABOLIC PANEL
ANION GAP: 8 (ref 5–15)
BUN: 8 mg/dL (ref 6–20)
CALCIUM: 8.5 mg/dL — AB (ref 8.9–10.3)
CHLORIDE: 110 mmol/L (ref 101–111)
CO2: 22 mmol/L (ref 22–32)
CREATININE: 0.79 mg/dL (ref 0.44–1.00)
GFR calc non Af Amer: >60 mL/min (ref >60)
Glucose, Bld: 127 mg/dL — ABNORMAL HIGH (ref 65–99)
Potassium: 4.5 mmol/L (ref 3.5–5.1)
SODIUM: 140 mmol/L (ref 135–145)

## 2016-07-17 LAB — CBC
HEMATOCRIT: 36.4 % (ref 36.0–46.0)
Hemoglobin: 11.5 g/dL — ABNORMAL LOW (ref 12.0–15.0)
MCH: 30.8 pg (ref 26.0–34.0)
MCHC: 31.6 g/dL (ref 30.0–36.0)
MCV: 97.6 fL (ref 78.0–100.0)
PLATELETS: 164 10*3/uL (ref 150–400)
RBC: 3.73 MIL/uL — ABNORMAL LOW (ref 3.87–5.11)
RDW: 14.3 % (ref 11.5–15.5)
WBC: 6.4 10*3/uL (ref 4.0–10.5)

## 2016-07-17 MED ORDER — VANCOMYCIN HCL IN DEXTROSE 1-5 GM/200ML-% IV SOLN
1000.0000 mg | Freq: Once | INTRAVENOUS | Status: AC
  Administered 2016-07-17: 1000 mg via INTRAVENOUS
  Filled 2016-07-17: qty 200

## 2016-07-17 MED ORDER — MORPHINE SULFATE (PF) 2 MG/ML IV SOLN
2.0000 mg | INTRAVENOUS | Status: DC | PRN
  Administered 2016-07-17 – 2016-07-18 (×6): 2 mg via INTRAVENOUS
  Filled 2016-07-17 (×6): qty 1

## 2016-07-17 MED ORDER — VANCOMYCIN HCL 500 MG IV SOLR
500.0000 mg | Freq: Three times a day (TID) | INTRAVENOUS | Status: DC
  Administered 2016-07-17 – 2016-07-19 (×5): 500 mg via INTRAVENOUS
  Filled 2016-07-17 (×9): qty 500

## 2016-07-17 NOTE — Progress Notes (Signed)
Pharmacy Antibiotic Note  Wendy Douglas is a 40 y.o. female admitted on 07/15/2016 with cellulitis.  Patient was given vancomycin on 07/15/16 and then transitioned to clindamycin.  Left hand abscess culture is growing MRSA and patient to resume vancomycin.  ID consulted.  Patient continues on Flagyl for vaginal infection.  Renal function is stable.  She remains afebrile and her WBC is WNL.   Plan: - Vanc 1gm IV x 1, then 500mg  IV Q8H - Flagyl 500mg  BID per MD - Monitor renal fxn, clinical progress, vanc trough at Css - F/U ID consult   Height: 5\' 5"  (165.1 cm) Weight: 125 lb (56.7 kg) IBW/kg (Calculated) : 57  Temp (24hrs), Avg:98.5 F (36.9 C), Min:98.1 F (36.7 C), Max:98.8 F (37.1 C)   Recent Labs Lab 07/15/16 0816 07/15/16 0828 07/15/16 1209 07/16/16 0315 07/17/16 0910  WBC 12.3*  --   --  10.2 6.4  CREATININE 0.69  --   --  0.85 0.79  LATICACIDVEN  --  1.46 0.98  --   --     Estimated Creatinine Clearance: 82.8 mL/min (by C-G formula based on SCr of 0.79 mg/dL).    Allergies  Allergen Reactions  . Bee Venom Shortness Of Breath and Swelling  . Sulfa Antibiotics Rash     Antimicrobials this admission:  Vanc 9/12 >> 9/12, resumed 9/14 >> Clinda 9/12 >> 9/14 Flagyl 9/12 >>  Dose adjustments this admission:  N/A  Microbiology results:  9/12 HIV - negative 9/12 MRSA / Staph - positive 9/12 left hand tissue - GPC on Gram stain, reincubated 9/12 UCx - negative 9/12 BCx x2 - GPR 1 of 2 (BCID negative) 9/12 left hand abscess - MRSA   Jing Howatt D. Laney Potashang, PharmD, BCPS Pager:  [phone] 07/17/2016, 1:26 PM

## 2016-07-17 NOTE — Progress Notes (Addendum)
[facility]                                                                              Patient Demographics  Wendy Douglas, is a 40 y.o. female, DOB - 12-Jun-1976, ZOX:[id number]  Admit date - 07/15/2016   Admitting Physician Ozella Rocks, MD  Outpatient Primary MD for the patient is No PCP Per Patient  Outpatient specialists:   LOS - 2  days    Chief Complaint  Patient presents with  . Arm Swelling  . Hand Injury       Brief summary   Wendy Douglas is a 40 y.o. female with medical history significant of COPD, polysubstance abuse/IVDU, presenting with left hand swelling pain in the redness. Started 1 day Prior to admission, the patient reported  attempted cocaine injection between the fourth and fifth fingers on the dorsum of the hand 3 days ago. States that this did not go into the vein directly. Symptoms are constant and getting worse. Denies fevers, chills, neck stiffness, headache, palpitations, chest pain, shortness of breath, nausea, vomiting, diarrhea, dysuria, frequency, vaginal discharge, back pain.    Assessment & Plan   Cellulitis and abscess of hand: Likely secondary to attempted cocaine injection hand.  - MRI showed severe cellulitis of the dorsal aspect of the left hand with complex infiltrative abscess located both superficial and deep to the extensor digitorum tendons, largest abscess 2.2 x 8 x 0.8 centimeter and infectious tenosynovitis. - Hand surgery was consulted, appreciate Dr. Carlos Levering assistance. Patient underwentirrigation and debridement of the deep abscess and radical tenosynovectomy on 9/12 - Management per hand surgery. - wound Cultures growing MRSA, will call ID consultation.  - Patient will need aggressive wound care, also discussed with social work regarding skilled nursing facility given patient does not have a home currently.  Trichomonas: present on UA (contaminated sample). Suspect vaginal infection.  - Continue  metronidazole  COPD: With slight wheezing - continue albuterol   Polysubstance use: Patient's UDS positive for opiates, cocaine and THC. - Per patient she uses heroin 1 g daily.Currently not in withdrawals, will continue IV morphine as needed. If patient goes into withdrawals, will need to be on clonidine detox protocol - Discussed in detail with the patient, she is reported that she was in a methadone program for 7 years and 6 months ago she quit and started using heroin. She is no longer on methadone.  Nicotine abuse: - Patient smokes half pack per day. - Placed on nicotine patch    Depression/Anxiety: - continue vistaril, Abilify, Celexa   Code Status: Full code DVT Prophylaxis:  SCD's Family Communication: Discussed in detail with the patient, all imaging results, lab results explained to the patient    Disposition Plan:   Time Spent in minutes 25 minutes  Procedures:  PROCEDURES: 1. Irrigation and debridement of deep abscess, skin, subcutaneous     tissue, tendon, muscle and bone/periosteum, underwent excisional     debridement with curette, knife and scissor.  This was an 8-cm in     length to various pie-crusted incisions and 2-3 cm in depth,     irrigation and debridement.  2. Radical tenosynovectomy of the extensor apparatus involving the     second, fourth, fifth compartments, dorsal hand, wrist and forearm  Consultants:   Hand surgery, Dr Amanda Pea  Antimicrobials:   IV clindamycin   Medications  Scheduled Meds: . Chlorhexidine Gluconate Cloth  6 each Topical Q0600  . clindamycin (CLEOCIN) IV  600 mg Intravenous Q8H  . feeding supplement (ENSURE ENLIVE)  237 mL Oral BID BM  . hydrOXYzine  25 mg Oral TID  . Influenza vac split quadrivalent PF  0.5 mL Intramuscular Tomorrow-1000  . ipratropium-albuterol  3 mL Nebulization Once  . metroNIDAZOLE  500 mg Oral Q12H  . multivitamin with minerals  1 tablet Oral Daily  . mupirocin ointment  1 application Nasal  BID  . nicotine  21 mg Transdermal Daily  . oxyCODONE  10 mg Oral Once   Continuous Infusions: . sodium chloride 100 mL/hr at 07/17/16 0429   PRN Meds:.acetaminophen **OR** acetaminophen, ipratropium-albuterol, ketorolac, morphine injection, ondansetron **OR** ondansetron (ZOFRAN) IV, oxyCODONE   Antibiotics   Anti-infectives    Start     Dose/Rate Route Frequency Ordered Stop   07/16/16 1000  metroNIDAZOLE (FLAGYL) tablet 500 mg     500 mg Oral Every 12 hours 07/15/16 1847 07/23/16 0959   07/15/16 2200  metroNIDAZOLE (FLAGYL) tablet 500 mg  Status:  Discontinued     500 mg Oral Every 12 hours 07/15/16 1718 07/15/16 1847   07/15/16 2130  vancomycin (VANCOCIN) IVPB 750 mg/150 ml premix  Status:  Discontinued     750 mg 150 mL/hr over 60 Minutes Intravenous Every 12 hours 07/15/16 0904 07/15/16 1841   07/15/16 1730  clindamycin (CLEOCIN) IVPB 600 mg     600 mg 100 mL/hr over 30 Minutes Intravenous Every 8 hours 07/15/16 1718     07/15/16 1130  metroNIDAZOLE (FLAGYL) tablet 2,000 mg     2,000 mg Oral  Once 07/15/16 1128 07/15/16 1135   07/15/16 0930  vancomycin (VANCOCIN) IVPB 1000 mg/200 mL premix     1,000 mg 200 mL/hr over 60 Minutes Intravenous  Once 07/15/16 0904 07/15/16 1051        Subjective:   Linnea Todisco was seen and examined today.Feels a lot better today, dressing on left arm intact. Pain controlled. Patient denies dizziness, chest pain, shortness of breath, abdominal pain, N/V/D/C, new weakness, numbess, tingling. No acute events overnight.  Pain is controlled  Objective:   Vitals:   07/16/16 1305 07/16/16 1730 07/16/16 2027 07/17/16 0454  BP: 129/76 136/72 (!) 149/76 138/71  Pulse: 71 63 70 62  Resp: 16 16 16 16   Temp: 99.5 F (37.5 C) 98.1 F (36.7 C) 98.8 F (37.1 C) 98.6 F (37 C)  TempSrc: Oral Oral Oral Oral  SpO2: 100% 100% 99% 100%  Weight:      Height:        Intake/Output Summary (Last 24 hours) at 07/17/16 1250 Last data filed at  07/17/16 0929  Gross per 24 hour  Intake             4070 ml  Output             1850 ml  Net             2220 ml     Wt Readings from Last 3 Encounters:  07/15/16 56.7 kg (125 lb)  05/31/16 54.4 kg (120 lb)  12/15/14 68 kg (150 lb)     Exam  General: Alert and oriented x 3, NAD  HEENT:    Neck:   Cardiovascular: S1 S2 auscultated, no rubs, murmurs or gallops. Regular rate and rhythm.  Respiratory: Clear to auscultation bilaterally, no wheezing, rales or rhonchi  Gastrointestinal: Soft, nontender, nondistended, + bowel sounds  Ext: no cyanosis clubbing or edema. Left hand with dressing intact  Neuro: no new deficits   Skin: No rashes  Psych: Normal affect and demeanor, alert and oriented x3    Data Reviewed:  I have personally reviewed following labs and imaging studies  Micro Results Recent Results (from the past 240 hour(s))  Blood culture (routine x 2)     Status: None (Preliminary result)   Collection Time: 07/15/16  8:16 AM  Result Value Ref Range Status   Specimen Description BLOOD  IV  Final   Special Requests IN PEDIATRIC BOTTLE  1CC  Final   Culture NO GROWTH 1 DAY  Final   Report Status PENDING  Incomplete  Blood culture (routine x 2)     Status: None (Preliminary result)   Collection Time: 07/15/16  9:00 AM  Result Value Ref Range Status   Specimen Description BLOOD LEFT HAND  Final   Special Requests IN PEDIATRIC BOTTLE  4CC  Final   Culture  Setup Time   Final    GRAM POSITIVE RODS AEROBIC BOTTLE ONLY CRITICAL RESULT CALLED TO, READ BACK BY AND VERIFIED WITH: J. MILLEN, PHARMD AT 1625 ON 07/16/16 BY C. JESSUP, MLT.    Culture GRAM POSITIVE RODS  Final   Report Status PENDING  Incomplete  Blood Culture ID Panel (Reflexed)     Status: None   Collection Time: 07/15/16  9:00 AM  Result Value Ref Range Status   Enterococcus species NOT DETECTED NOT DETECTED Final   Listeria monocytogenes NOT DETECTED NOT DETECTED Final   Staphylococcus species  NOT DETECTED NOT DETECTED Final   Staphylococcus aureus NOT DETECTED NOT DETECTED Final   Streptococcus species NOT DETECTED NOT DETECTED Final   Streptococcus agalactiae NOT DETECTED NOT DETECTED Final   Streptococcus pneumoniae NOT DETECTED NOT DETECTED Final   Streptococcus pyogenes NOT DETECTED NOT DETECTED Final   Acinetobacter baumannii NOT DETECTED NOT DETECTED Final   Enterobacteriaceae species NOT DETECTED NOT DETECTED Final   Enterobacter cloacae complex NOT DETECTED NOT DETECTED Final   Escherichia coli NOT DETECTED NOT DETECTED Final   Klebsiella oxytoca NOT DETECTED NOT DETECTED Final   Klebsiella pneumoniae NOT DETECTED NOT DETECTED Final   Proteus species NOT DETECTED NOT DETECTED Final   Serratia marcescens NOT DETECTED NOT DETECTED Final   Haemophilus influenzae NOT DETECTED NOT DETECTED Final   Neisseria meningitidis NOT DETECTED NOT DETECTED Final   Pseudomonas aeruginosa NOT DETECTED NOT DETECTED Final   Candida albicans NOT DETECTED NOT DETECTED Final   Candida glabrata NOT DETECTED NOT DETECTED Final   Candida krusei NOT DETECTED NOT DETECTED Final   Candida parapsilosis NOT DETECTED NOT DETECTED Final   Candida tropicalis NOT DETECTED NOT DETECTED Final  Urine culture     Status: Abnormal   Collection Time: 07/15/16 10:20 AM  Result Value Ref Range Status   Specimen Description URINE, RANDOM  Final   Special Requests NONE  Final   Culture <10,000 COLONIES/mL INSIGNIFICANT GROWTH (A)  Final   Report Status 07/16/2016 FINAL  Final  Surgical pcr screen     Status: Abnormal   Collection Time: 07/15/16  6:27 PM  Result Value Ref Range Status   MRSA, PCR POSITIVE (A) NEGATIVE Final   Staphylococcus aureus POSITIVE (A)  NEGATIVE Final    Comment:        The Xpert SA Assay (FDA approved for NASAL specimens in patients over 75 years of age), is one component of a comprehensive surveillance program.  Test performance has been validated by [facility] for patients  greater than or equal to 67 year old. It is not intended to diagnose infection nor to guide or monitor treatment. CRITICAL RESULT CALLED TO, READ BACK BY AND VERIFIED WITH: CBilly Coast, RN AT 2027 ON 07/15/16 BY C. JESSUP, MLT.   Aerobic/Anaerobic Culture (surgical/deep wound)     Status: None (Preliminary result)   Collection Time: 07/15/16  7:51 PM  Result Value Ref Range Status   Specimen Description ABSCESS LEFT HAND  Final   Special Requests PT ON FLAGYL DORSAL LEFT HAND SWAB  Final   Gram Stain   Final    FEW WBC PRESENT,BOTH PMN AND MONONUCLEAR FEW GRAM POSITIVE COCCI IN CLUSTERS    Culture   Final    MODERATE METHICILLIN RESISTANT STAPHYLOCOCCUS AUREUS NO ANAEROBES ISOLATED; CULTURE IN PROGRESS FOR 5 DAYS    Report Status PENDING  Incomplete   Organism ID, Bacteria METHICILLIN RESISTANT STAPHYLOCOCCUS AUREUS  Final      Susceptibility   Methicillin resistant staphylococcus aureus - MIC*    CIPROFLOXACIN <=0.5 SENSITIVE Sensitive     ERYTHROMYCIN >=8 RESISTANT Resistant     GENTAMICIN <=0.5 SENSITIVE Sensitive     OXACILLIN >=4 RESISTANT Resistant     TETRACYCLINE <=1 SENSITIVE Sensitive     VANCOMYCIN 1 SENSITIVE Sensitive     TRIMETH/SULFA <=10 SENSITIVE Sensitive     CLINDAMYCIN <=0.25 SENSITIVE Sensitive     RIFAMPIN <=0.5 SENSITIVE Sensitive     Inducible Clindamycin NEGATIVE Sensitive     * MODERATE METHICILLIN RESISTANT STAPHYLOCOCCUS AUREUS  Aerobic/Anaerobic Culture (surgical/deep wound)     Status: None (Preliminary result)   Collection Time: 07/15/16  7:54 PM  Result Value Ref Range Status   Specimen Description TISSUE LEFT HAND  Final   Special Requests DOSAL  PT ON FLAGYL  Final   Gram Stain   Final    MODERATE WBC PRESENT,BOTH PMN AND MONONUCLEAR RARE GRAM POSITIVE COCCI IN PAIRS    Culture CULTURE REINCUBATED FOR BETTER GROWTH  Final   Report Status PENDING  Incomplete    Radiology Reports Dg Chest 2 View  Result Date: 07/15/2016 CLINICAL DATA:   Rhonchi on chest auscultation.  Smoker. EXAM: CHEST  2 VIEW COMPARISON:  Single-view of the chest 05/31/2016. PA and lateral chest 12/15/2014. CT chest 11/15/2012. FINDINGS: The chest is hyperexpanded with peribronchial thickening. A small focus of airspace opacity is seen in the right upper lobe on the frontal pain and appears new since the most recent examination. Heart size is normal. No pneumothorax or pleural effusion. IMPRESSION: Findings compatible with COPD. Small focus of airspace opacity in the right upper lobe may be atelectasis or infection. Recommend repeat PA and lateral films in 3-4 weeks to ensure resolution. Electronically Signed   By: Drusilla Kanner M.D.   On: 07/15/2016 15:02   Dg Forearm Left  Result Date: 07/15/2016 CLINICAL DATA:  Left hand and 4 arm swelling. Complains of numbness and tingling. EXAM: LEFT FOREARM - 2 VIEW COMPARISON:  None. FINDINGS: Frontal and lateral views of the left forearm demonstrate diffuse subcutaneous soft tissue swelling of the left forearm. There is no acute displaced fracture. No significant elbow effusion. Radial head alignment normal. No radiopaque foreign body. No gas collections in  the soft tissues. IMPRESSION: 1. Diffuse soft tissue swelling of the left forearm 2. No radiographic evidence for acute osseous abnormality Electronically Signed   By: Jasmine PangKim  Fujinaga M.D.   On: 07/15/2016 11:58   Mr Hand Left W Wo Contrast  Result Date: 07/15/2016 CLINICAL DATA:  Redness and swelling of the hand. Presents with left hand and distal left arm swelling over the past several hours. EXAM: MR OF THE LEFT WRIST WITHOUT AND WITH CONTRAST TECHNIQUE: Multiplanar multisequence MR imaging of the left wrist was performed both before and after the administration of intravenous contrast. CONTRAST:  10mL MULTIHANCE GADOBENATE DIMEGLUMINE 529 MG/ML IV SOLN COMPARISON:  None. FINDINGS: Ligaments:  Intact scapholunate and lunotriquetral ligaments. Triangular fibrocartilage:  Central perforation of the TFCC. Tendons: Intact flexor compartment tendons. Tenosynovitis of the extensor digitorum, extensor carpi radialis brevis and extensor carpi radialis longus. Carpal tunnel/median nerve: Normal carpal tunnel. Normal median nerve. Guyon's canal: Normal. Joint/cartilage: No joint effusion. No chondral defect. Bones/carpal alignment: No marrow signal abnormality. Normal alignment. No aggressive osseous lesion. Other: Severe soft tissue swelling enhancement of the dorsal aspect of the to lesser extent volar aspect of the hand along the thenar are eminence. Complex fluid collection infiltrating the dorsal soft tissues with the largest fluid collection measuring 2.2 x 81.4 x 0.8 cm at the level of the third-fourth mid metacarpal with a focal area extending deep to the extensor digitorum of the fourth digit at the level of the MCP joint. The fluid collection is located both superficial and deep to the extensor digitorum tendons of the second, third, fourth and fifth digits. IMPRESSION: 1. Severe cellulitis of the dorsal aspect of the left hand with a complex infiltrative abscess along the dorsal aspect of the hand which is located both superficial and deep to the extensor digitorum tendons. Largest abscess measures 2.2 x 81.4 x 0.8 cm at the level of the third-fourth mid metacarpal with a focal area extending deep to the extensor digitorum of the fourth digit at the level of the MCP joint. 2. Tenosynovitis of the extensor digitorum, extensor carpi radialis brevis and extensor carpi radialis longus. These results were called by telephone at the time of interpretation on 07/15/2016 at 6:18 pm to Dr. Amanda PeaGRAMIG, who verbally acknowledged these results. Electronically Signed   By: Elige KoHetal  Patel   On: 07/15/2016 18:19   Dg Hand Complete Left  Result Date: 07/15/2016 CLINICAL DATA:  Left hand swelling that started a few days ago after injected hand with cocaine. For arm numbness and tingling. EXAM: LEFT  HAND - COMPLETE 3+ VIEW COMPARISON:  None. FINDINGS: PA oblique and lateral views left hand. No acute displaced fracture or malalignment. Diffuse soft tissue swelling over the dorsum of the hand. No radiopaque foreign body or soft tissue gas collections. IMPRESSION: Marked swelling over the dorsum of the left hand. No acute underlying osseous abnormality. Electronically Signed   By: Jasmine PangKim  Fujinaga M.D.   On: 07/15/2016 11:57    Lab Data:  CBC:  Recent Labs Lab 07/15/16 0816 07/16/16 0315 07/17/16 0910  WBC 12.3* 10.2 6.4  NEUTROABS 8.9*  --   --   HGB 14.2 11.8* 11.5*  HCT 42.4 36.7 36.4  MCV 95.3 97.1 97.6  PLT 216 191 164   Basic Metabolic Panel:  Recent Labs Lab 07/15/16 0816 07/16/16 0315 07/17/16 0910  NA 135 138 140  K 3.9 3.8 4.5  CL 102 105 110  CO2 22 27 22   GLUCOSE 98 102* 127*  BUN 8  8 8  CREATININE 0.69 0.85 0.79  CALCIUM 9.2 8.7* 8.5*   GFR: Estimated Creatinine Clearance: 82.8 mL/min (by C-G formula based on SCr of 0.79 mg/dL). Liver Function Tests:  Recent Labs Lab 07/15/16 0816  AST 22  ALT 18  ALKPHOS 123  BILITOT 0.6  PROT 7.8  ALBUMIN 4.1   No results for input(s): LIPASE, AMYLASE in the last 168 hours. No results for input(s): AMMONIA in the last 168 hours. Coagulation Profile: No results for input(s): INR, PROTIME in the last 168 hours. Cardiac Enzymes: No results for input(s): CKTOTAL, CKMB, CKMBINDEX, TROPONINI in the last 168 hours. BNP (last 3 results) No results for input(s): PROBNP in the last 8760 hours. HbA1C: No results for input(s): HGBA1C in the last 72 hours. CBG: No results for input(s): GLUCAP in the last 168 hours. Lipid Profile: No results for input(s): CHOL, HDL, LDLCALC, TRIG, CHOLHDL, LDLDIRECT in the last 72 hours. Thyroid Function Tests: No results for input(s): TSH, T4TOTAL, FREET4, T3FREE, THYROIDAB in the last 72 hours. Anemia Panel: No results for input(s): VITAMINB12, FOLATE, FERRITIN, TIBC, IRON,  RETICCTPCT in the last 72 hours. Urine analysis:    Component Value Date/Time   COLORURINE COLORLESS (A) 07/15/2016 1020   APPEARANCEUR CLEAR 07/15/2016 1020   LABSPEC <1.005 (L) 07/15/2016 1020   PHURINE 6.0 07/15/2016 1020   GLUCOSEU NEGATIVE 07/15/2016 1020   HGBUR TRACE (A) 07/15/2016 1020   BILIRUBINUR NEGATIVE 07/15/2016 1020   KETONESUR NEGATIVE 07/15/2016 1020   PROTEINUR NEGATIVE 07/15/2016 1020   UROBILINOGEN 0.2 03/22/2010 1131   NITRITE NEGATIVE 07/15/2016 1020   LEUKOCYTESUR LARGE (A) 07/15/2016 1020     Marylene Masek M.D. [facility] 07/17/2016, 12:50 PM  Pager: [phone] Between 7am to 7pm - call Pager - [phone]  After 7pm go to [email or web address] - password [email or web address]  Call night coverage person covering after 7pm

## 2016-07-17 NOTE — Progress Notes (Signed)
CSW received consult for potential SNF placement for wound care/IV antibiotics.  Per PT wound therapy can be completed by patient and would not require placement.  CSW discussed possibility for IV antibiotics with MD- unsure at this time if pt will require IV or can DC with PO  CSW also clarified pt methadone status- per MD pt was on methadone but stopped going 6 months ago and began using heroin.  CSW will continue to follow and assist with placement if pt will require IV antibiotics at DC.  Burna SisJenna H. Adebayo Ensminger, LCSWA Clinical Social Worker [phone]

## 2016-07-17 NOTE — Progress Notes (Signed)
Physical Therapy Wound Treatment Patient Details  Name: Wendy Douglas MRN: [id number] Date of Birth: 1976-03-24  Today's Date: 07/17/2016 Time: 2595-6387 Time Calculation (min): 18 min  Subjective  Subjective: Pt reports no pain with PLS. Patient and Family Stated Goals: get better Date of Onset: 07/12/16 Prior Treatments: s/p surgical I&D  Pain Score: Pain Score: 4  (PT Hydrotherapy)  Wound Assessment  Wound / Incision (Open or Dehisced) 07/16/16 Incision - Open Hand Left;Posterior;Proximal s/p I&D (Active)  Dressing Type Compression wrap;Gauze (Comment);Moist to dry 07/17/2016 10:15 AM  Dressing Changed Changed 07/17/2016 10:15 AM  Dressing Status Clean;Dry;Intact 07/17/2016 10:15 AM  Dressing Change Frequency Daily 07/17/2016 10:15 AM  Site / Wound Assessment Red;Bleeding 07/17/2016 10:15 AM  % Wound base Red or Granulating 100% 07/17/2016 10:15 AM  % Wound base Yellow 0% 07/17/2016 10:15 AM  % Wound base Black 0% 07/17/2016 10:15 AM  % Wound base Other (Comment) 0% 07/17/2016 10:15 AM  Peri-wound Assessment Erythema (blanchable);Edema 07/17/2016 10:15 AM  Wound Length (cm) 1.5 cm 07/16/2016 12:38 PM  Wound Width (cm) 0.5 cm 07/16/2016 12:38 PM  Wound Depth (cm) 0.3 cm 07/16/2016 12:38 PM  Margins Unattached edges (unapproximated) 07/17/2016 10:15 AM  Closure None 07/17/2016 10:15 AM  Drainage Amount Moderate 07/17/2016 10:15 AM  Drainage Description Serosanguineous 07/17/2016 10:15 AM  Treatment Hydrotherapy (Pulse lavage);Packing (Saline gauze) 07/17/2016 10:15 AM     Wound / Incision (Open or Dehisced) 07/16/16 Incision - Open Hand Left;Posterior;Mid s/p I&D (Active)  Dressing Type Compression wrap;Gauze (Comment);Moist to dry 07/17/2016 10:15 AM  Dressing Changed Changed 07/17/2016 10:15 AM  Dressing Status Clean;Dry;Intact 07/17/2016 10:15 AM  Dressing Change Frequency Daily 07/17/2016 10:15 AM  Site / Wound Assessment Red;Bleeding 07/17/2016 10:15 AM  % Wound base Red or Granulating 95%  07/17/2016 10:15 AM  % Wound base Yellow 5% 07/17/2016 10:15 AM  % Wound base Black 0% 07/17/2016 10:15 AM  % Wound base Other (Comment) 0% 07/17/2016 10:15 AM  Peri-wound Assessment Edema;Erythema (blanchable) 07/17/2016 10:15 AM  Wound Length (cm) 1.5 cm 07/16/2016 12:38 PM  Wound Width (cm) 0.5 cm 07/16/2016 12:38 PM  Wound Depth (cm) 0.3 cm 07/16/2016 12:38 PM  Margins Unattached edges (unapproximated) 07/17/2016 10:15 AM  Closure None 07/17/2016 10:15 AM  Drainage Amount Moderate 07/17/2016 10:15 AM  Drainage Description Serosanguineous 07/17/2016 10:15 AM  Treatment Hydrotherapy (Pulse lavage);Packing (Saline gauze) 07/17/2016 10:15 AM     Wound / Incision (Open or Dehisced) 07/16/16 Incision - Open Hand Left;Posterior;Distal s/p I&D (Active)  Dressing Type Compression wrap;Gauze (Comment);Moist to dry 07/17/2016 10:15 AM  Dressing Changed Changed 07/17/2016 10:15 AM  Dressing Status Clean;Dry;Intact 07/17/2016 10:15 AM  Dressing Change Frequency Daily 07/17/2016 10:15 AM  Site / Wound Assessment Red;Bleeding 07/17/2016 10:15 AM  % Wound base Red or Granulating 100% 07/17/2016 10:15 AM  % Wound base Yellow 0% 07/17/2016 10:15 AM  % Wound base Black 0% 07/17/2016 10:15 AM  % Wound base Other (Comment) 0% 07/17/2016 10:15 AM  Peri-wound Assessment Edema;Erythema (blanchable) 07/17/2016 10:15 AM  Wound Length (cm) 3 cm 07/16/2016 12:38 PM  Wound Width (cm) 0.3 cm 07/16/2016 12:38 PM  Wound Depth (cm) 0.3 cm 07/16/2016 12:38 PM  Margins Unattached edges (unapproximated) 07/17/2016 10:15 AM  Closure Surface sutures 07/17/2016 10:15 AM  Drainage Amount Moderate 07/17/2016 10:15 AM  Drainage Description Serosanguineous 07/17/2016 10:15 AM  Treatment Hydrotherapy (Pulse lavage);Packing (Saline gauze) 07/17/2016 10:15 AM   Hydrotherapy Pulsed lavage therapy - wound location: dorsum of lt hand Pulsed Lavage with Suction (  psi): 4 psi Pulsed Lavage with Suction - Normal Saline Used: 1000 mL Pulsed Lavage Tip: Tip  with splash shield   Wound Assessment and Plan  Wound Therapy - Assess/Plan/Recommendations Wound Therapy - Clinical Statement: Wound continues to look good. Pt asking appropriated questions and verbalizes understanding of dressing changes. Wound Therapy - Functional Problem List: Decr use of left hand Factors Delaying/Impairing Wound Healing: Infection - systemic/local;Substance abuse;Tobacco use Hydrotherapy Plan: Dressing change;Patient/family education;Pulsatile lavage with suction Wound Therapy - Frequency: 6X / week Wound Therapy - Follow Up Recommendations: Other (comment) (Per MD. Likely self dressing change.) Wound Plan: See above  Wound Therapy Goals- Improve the function of patient's integumentary system by progressing the wound(s) through the phases of wound healing (inflammation - proliferation - remodeling) by: Decrease Length/Width/Depth by (cm): depth by .1 Decrease Length/Width/Depth - Progress: Progressing toward goal Improve Drainage Characteristics: Min Improve Drainage Characteristics - Progress: Progressing toward goal  Goals will be updated until maximal potential achieved or discharge criteria met.  Discharge criteria: when goals achieved, discharge from hospital, MD decision/surgical intervention, no progress towards goals, refusal/missing three consecutive treatments without notification or medical reason.  GP     Lyriq Finerty 07/17/2016, 10:22 AM Suanne Marker PT [phone]

## 2016-07-17 NOTE — Progress Notes (Signed)
Subjective: 2 Days Post-Op Procedure(s) (LRB): IRRIGATION AND DEBRIDEMENT LEFT HAND WITH TENOSYNOVECTOMY (Left) Patient reports pain as overall improved. She denies nausea, vomiting, fever or chills. She tolerated hydrotherapy today without difficulties.     Objective: Vital signs in last 24 hours: Temp:  [98.6 F (37 C)-98.8 F (37.1 C)] 98.6 F (37 C) (09/14 1413) Pulse Rate:  [57-70] 57 (09/14 1413) Resp:  [16-18] 18 (09/14 1413) BP: (123-149)/(69-76) 123/69 (09/14 1413) SpO2:  [99 %-100 %] 100 % (09/14 1413)  Intake/Output from previous day: 09/13 0701 - 09/14 0700 In: 3310 [P.O.:1080; I.V.:2130; IV Piggyback:100] Out: 2700 [Urine:2700] Intake/Output this shift: Total I/O In: 2081.7 [P.O.:720; I.V.:1111.7; IV Piggyback:250] Out: 750 [Urine:750]   Recent Labs  07/15/16 0816 07/16/16 0315 07/17/16 0910  HGB 14.2 11.8* 11.5*    Recent Labs  07/16/16 0315 07/17/16 0910  WBC 10.2 6.4  RBC 3.78* 3.73*  HCT 36.7 36.4  PLT 191 164   Results for orders placed or performed during the hospital encounter of 07/15/16  Blood culture (routine x 2)     Status: None (Preliminary result)   Collection Time: 07/15/16  8:16 AM  Result Value Ref Range Status   Specimen Description BLOOD  IV  Final   Special Requests IN PEDIATRIC BOTTLE  1CC  Final   Culture NO GROWTH 2 DAYS  Final   Report Status PENDING  Incomplete  Blood culture (routine x 2)     Status: None (Preliminary result)   Collection Time: 07/15/16  9:00 AM  Result Value Ref Range Status   Specimen Description BLOOD LEFT HAND  Final   Special Requests IN PEDIATRIC BOTTLE  4CC  Final   Culture  Setup Time   Final    GRAM POSITIVE RODS AEROBIC BOTTLE ONLY CRITICAL RESULT CALLED TO, READ BACK BY AND VERIFIED WITH: J. MILLEN, PHARMD AT 1625 ON 07/16/16 BY C. JESSUP, MLT.    Culture GRAM POSITIVE RODS  Final   Report Status PENDING  Incomplete  Blood Culture ID Panel (Reflexed)     Status: None   Collection Time:  07/15/16  9:00 AM  Result Value Ref Range Status   Enterococcus species NOT DETECTED NOT DETECTED Final   Listeria monocytogenes NOT DETECTED NOT DETECTED Final   Staphylococcus species NOT DETECTED NOT DETECTED Final   Staphylococcus aureus NOT DETECTED NOT DETECTED Final   Streptococcus species NOT DETECTED NOT DETECTED Final   Streptococcus agalactiae NOT DETECTED NOT DETECTED Final   Streptococcus pneumoniae NOT DETECTED NOT DETECTED Final   Streptococcus pyogenes NOT DETECTED NOT DETECTED Final   Acinetobacter baumannii NOT DETECTED NOT DETECTED Final   Enterobacteriaceae species NOT DETECTED NOT DETECTED Final   Enterobacter cloacae complex NOT DETECTED NOT DETECTED Final   Escherichia coli NOT DETECTED NOT DETECTED Final   Klebsiella oxytoca NOT DETECTED NOT DETECTED Final   Klebsiella pneumoniae NOT DETECTED NOT DETECTED Final   Proteus species NOT DETECTED NOT DETECTED Final   Serratia marcescens NOT DETECTED NOT DETECTED Final   Haemophilus influenzae NOT DETECTED NOT DETECTED Final   Neisseria meningitidis NOT DETECTED NOT DETECTED Final   Pseudomonas aeruginosa NOT DETECTED NOT DETECTED Final   Candida albicans NOT DETECTED NOT DETECTED Final   Candida glabrata NOT DETECTED NOT DETECTED Final   Candida krusei NOT DETECTED NOT DETECTED Final   Candida parapsilosis NOT DETECTED NOT DETECTED Final   Candida tropicalis NOT DETECTED NOT DETECTED Final  Urine culture     Status: Abnormal   Collection Time: 07/15/16  10:20 AM  Result Value Ref Range Status   Specimen Description URINE, RANDOM  Final   Special Requests NONE  Final   Culture <10,000 COLONIES/mL INSIGNIFICANT GROWTH (A)  Final   Report Status 07/16/2016 FINAL  Final  Surgical pcr screen     Status: Abnormal   Collection Time: 07/15/16  6:27 PM  Result Value Ref Range Status   MRSA, PCR POSITIVE (A) NEGATIVE Final   Staphylococcus aureus POSITIVE (A) NEGATIVE Final    Comment:        The Xpert SA Assay  (FDA approved for NASAL specimens in patients over 721 years of age), is one component of a comprehensive surveillance program.  Test performance has been validated by [facility] for patients greater than or equal to 42 year old. It is not intended to diagnose infection nor to guide or monitor treatment. CRITICAL RESULT CALLED TO, READ BACK BY AND VERIFIED WITH: Billy Coast. TULIA, RN AT 2027 ON 07/15/16 BY C. JESSUP, MLT.   Aerobic/Anaerobic Culture (surgical/deep wound)     Status: None (Preliminary result)   Collection Time: 07/15/16  7:51 PM  Result Value Ref Range Status   Specimen Description ABSCESS LEFT HAND  Final   Special Requests PT ON FLAGYL DORSAL LEFT HAND SWAB  Final   Gram Stain   Final    FEW WBC PRESENT,BOTH PMN AND MONONUCLEAR FEW GRAM POSITIVE COCCI IN CLUSTERS    Culture   Final    MODERATE METHICILLIN RESISTANT STAPHYLOCOCCUS AUREUS NO ANAEROBES ISOLATED; CULTURE IN PROGRESS FOR 5 DAYS    Report Status PENDING  Incomplete   Organism ID, Bacteria METHICILLIN RESISTANT STAPHYLOCOCCUS AUREUS  Final      Susceptibility   Methicillin resistant staphylococcus aureus - MIC*    CIPROFLOXACIN <=0.5 SENSITIVE Sensitive     ERYTHROMYCIN >=8 RESISTANT Resistant     GENTAMICIN <=0.5 SENSITIVE Sensitive     OXACILLIN >=4 RESISTANT Resistant     TETRACYCLINE <=1 SENSITIVE Sensitive     VANCOMYCIN 1 SENSITIVE Sensitive     TRIMETH/SULFA <=10 SENSITIVE Sensitive     CLINDAMYCIN <=0.25 SENSITIVE Sensitive     RIFAMPIN <=0.5 SENSITIVE Sensitive     Inducible Clindamycin NEGATIVE Sensitive     * MODERATE METHICILLIN RESISTANT STAPHYLOCOCCUS AUREUS  Aerobic/Anaerobic Culture (surgical/deep wound)     Status: None (Preliminary result)   Collection Time: 07/15/16  7:54 PM  Result Value Ref Range Status   Specimen Description TISSUE LEFT HAND  Final   Special Requests DOSAL  PT ON FLAGYL  Final   Gram Stain   Final    MODERATE WBC PRESENT,BOTH PMN AND MONONUCLEAR RARE GRAM  POSITIVE COCCI IN PAIRS    Culture CULTURE REINCUBATED FOR BETTER GROWTH  Final   Report Status PENDING  Incomplete    Recent Labs  07/16/16 0315 07/17/16 0910  NA 138 140  K 3.8 4.5  CL 105 110  CO2 27 22  BUN 8 8  CREATININE 0.85 0.79  GLUCOSE 102* 127*  CALCIUM 8.7* 8.5*   No results for input(s): LABPT, INR in the last 72 hours.  Examination of the left upper extremity: Dressings are removed. Her hand has overall improvement in terms of the edema. The cellulitic process is markedly improved. Her range of motion is improving. The wound wicks are removed there is no purulent discharge present. Repeat irrigation and wet-to-dry dressings were applied. Assessment/Plan: 2 Days Post-Op Procedure(s) (LRB): IRRIGATION AND DEBRIDEMENT LEFT HAND WITH TENOSYNOVECTOMY (Left) The patient overall is improving,  however, we recommended continued IV antibiotics and diligent daily wound care with hydrotherapy. Please encourage the patient to range the fingers with flexion and extension. We will continue to monitor her carefully questions were encouraged and answered.  Madelyn Tlatelpa L 07/17/2016, 6:17 PM
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
## 2016-07-18 DIAGNOSIS — E440 Moderate protein-calorie malnutrition: Secondary | ICD-10-CM | POA: Insufficient documentation

## 2016-07-18 DIAGNOSIS — B182 Chronic viral hepatitis C: Secondary | ICD-10-CM

## 2016-07-18 LAB — HEPATITIS PANEL, ACUTE
HCV Ab: >11 s/co ratio — ABNORMAL HIGH (ref 0.0–0.9)
Hep A IgM: NEGATIVE
Hep B C IgM: NEGATIVE
Hepatitis B Surface Ag: NEGATIVE

## 2016-07-18 LAB — CULTURE, BLOOD (ROUTINE X 2)

## 2016-07-18 LAB — BASIC METABOLIC PANEL
ANION GAP: 4 — AB (ref 5–15)
BUN: 9 mg/dL (ref 6–20)
CHLORIDE: 111 mmol/L (ref 101–111)
CO2: 26 mmol/L (ref 22–32)
Calcium: 8.7 mg/dL — ABNORMAL LOW (ref 8.9–10.3)
Creatinine, Ser: 0.72 mg/dL (ref 0.44–1.00)
GFR calc Af Amer: >60 mL/min (ref >60)
GLUCOSE: 87 mg/dL (ref 65–99)
POTASSIUM: 4.3 mmol/L (ref 3.5–5.1)
Sodium: 141 mmol/L (ref 135–145)

## 2016-07-18 LAB — RPR: RPR Ser Ql: NONREACTIVE

## 2016-07-18 LAB — CBC
HCT: 35.5 % — ABNORMAL LOW (ref 36.0–46.0)
HEMOGLOBIN: 11.6 g/dL — AB (ref 12.0–15.0)
MCH: 31.4 pg (ref 26.0–34.0)
MCHC: 32.7 g/dL (ref 30.0–36.0)
MCV: 96.2 fL (ref 78.0–100.0)
PLATELETS: 162 10*3/uL (ref 150–400)
RBC: 3.69 MIL/uL — AB (ref 3.87–5.11)
RDW: 14.2 % (ref 11.5–15.5)
WBC: 6.4 10*3/uL (ref 4.0–10.5)

## 2016-07-18 MED ORDER — MORPHINE SULFATE (PF) 2 MG/ML IV SOLN
2.0000 mg | INTRAVENOUS | Status: DC | PRN
  Administered 2016-07-18 – 2016-07-20 (×12): 2 mg via INTRAVENOUS
  Filled 2016-07-18 (×12): qty 1

## 2016-07-18 NOTE — Progress Notes (Signed)
Physical Therapy Wound Treatment Patient Details  Name: Wendy Douglas MRN: [id number] Date of Birth: Nov 11, 1975  Today's Date: 07/18/2016 Time: 1005-1030 Time Calculation (min): 25 min  Subjective  Subjective: No new c/o's Patient and Family Stated Goals: get better Date of Onset: 07/12/16 Prior Treatments: s/p surgical I&D  Pain Score:  mild  Wound Assessment  Wound / Incision (Open or Dehisced) 07/16/16 Incision - Open Hand Left;Posterior;Proximal s/p I&D (Active)  Dressing Type Compression wrap;Gauze (Comment);Moist to dry 07/18/2016 11:14 AM  Dressing Changed Changed 07/18/2016 11:14 AM  Dressing Status Clean;Dry;Intact 07/18/2016 11:14 AM  Dressing Change Frequency Daily 07/18/2016 11:14 AM  Site / Wound Assessment Red;Bleeding 07/18/2016 11:14 AM  % Wound base Red or Granulating 100% 07/18/2016 11:14 AM  % Wound base Yellow 0% 07/18/2016 11:14 AM  % Wound base Black 0% 07/18/2016 11:14 AM  % Wound base Other (Comment) 0% 07/18/2016 11:14 AM  Peri-wound Assessment Erythema (blanchable);Edema 07/18/2016 11:14 AM  Wound Length (cm) 1.5 cm 07/16/2016 12:38 PM  Wound Width (cm) 0.5 cm 07/16/2016 12:38 PM  Wound Depth (cm) 0.3 cm 07/16/2016 12:38 PM  Margins Unattached edges (unapproximated) 07/18/2016 11:14 AM  Closure None 07/18/2016 11:14 AM  Drainage Amount Moderate 07/18/2016 11:14 AM  Drainage Description Serosanguineous 07/18/2016 11:14 AM  Treatment Hydrotherapy (Pulse lavage);Packing (Saline gauze) 07/18/2016 11:14 AM     Wound / Incision (Open or Dehisced) 07/16/16 Incision - Open Hand Left;Posterior;Mid s/p I&D (Active)  Dressing Type Compression wrap;Gauze (Comment);Moist to dry 07/18/2016 11:14 AM  Dressing Changed Changed 07/18/2016 11:14 AM  Dressing Status Clean;Dry;Intact 07/18/2016 11:14 AM  Dressing Change Frequency Daily 07/18/2016 11:14 AM  Site / Wound Assessment Red;Bleeding 07/18/2016 11:14 AM  % Wound base Red or Granulating 100% 07/18/2016 11:14 AM  % Wound base Yellow 0%  07/18/2016 11:14 AM  % Wound base Black 0% 07/18/2016 11:14 AM  % Wound base Other (Comment) 0% 07/18/2016 11:14 AM  Peri-wound Assessment Edema;Erythema (blanchable) 07/18/2016 11:14 AM  Wound Length (cm) 1.5 cm 07/16/2016 12:38 PM  Wound Width (cm) 0.5 cm 07/16/2016 12:38 PM  Wound Depth (cm) 0.3 cm 07/16/2016 12:38 PM  Margins Unattached edges (unapproximated) 07/18/2016 11:14 AM  Closure None 07/18/2016 11:14 AM  Drainage Amount Moderate 07/18/2016 11:14 AM  Drainage Description Serosanguineous 07/18/2016 11:14 AM  Treatment Hydrotherapy (Pulse lavage);Packing (Saline gauze) 07/18/2016 11:14 AM     Wound / Incision (Open or Dehisced) 07/16/16 Incision - Open Hand Left;Posterior;Distal s/p I&D (Active)  Dressing Type Compression wrap;Gauze (Comment);Moist to dry 07/18/2016 11:14 AM  Dressing Changed Changed 07/18/2016 11:14 AM  Dressing Status Clean;Dry;Intact 07/18/2016 11:14 AM  Dressing Change Frequency Daily 07/18/2016 11:14 AM  Site / Wound Assessment Red;Bleeding 07/18/2016 11:14 AM  % Wound base Red or Granulating 100% 07/18/2016 11:14 AM  % Wound base Yellow 0% 07/18/2016 11:14 AM  % Wound base Black 0% 07/18/2016 11:14 AM  % Wound base Other (Comment) 0% 07/18/2016 11:14 AM  Peri-wound Assessment Edema;Erythema (blanchable) 07/18/2016 11:14 AM  Wound Length (cm) 3 cm 07/16/2016 12:38 PM  Wound Width (cm) 0.3 cm 07/16/2016 12:38 PM  Wound Depth (cm) 0.3 cm 07/16/2016 12:38 PM  Margins Unattached edges (unapproximated) 07/18/2016 11:14 AM  Closure Surface sutures 07/18/2016 11:14 AM  Drainage Amount Moderate 07/18/2016 11:14 AM  Drainage Description Serosanguineous 07/18/2016 11:14 AM  Treatment Hydrotherapy (Pulse lavage);Packing (Saline gauze) 07/18/2016 11:14 AM   Hydrotherapy Pulsed lavage therapy - wound location: dorsum of lt hand Pulsed Lavage with Suction (psi): 4 psi Pulsed Lavage with Suction -  Normal Saline Used: 1000 mL Pulsed Lavage Tip: Tip with splash shield   Wound Assessment and  Plan  Wound Therapy - Assess/Plan/Recommendations Wound Therapy - Clinical Statement: Wound continues to look good. Pt asking appropriated questions and verbalizes understanding of dressing changes. Wound Therapy - Functional Problem List: Decr use of left hand Factors Delaying/Impairing Wound Healing: Infection - systemic/local;Substance abuse;Tobacco use Hydrotherapy Plan: Dressing change;Patient/family education;Pulsatile lavage with suction Wound Therapy - Frequency: 6X / week Wound Therapy - Follow Up Recommendations: Other (comment) (Per MD. Likely self dressing change.) Wound Plan: See above  Wound Therapy Goals- Improve the function of patient's integumentary system by progressing the wound(s) through the phases of wound healing (inflammation - proliferation - remodeling) by: Decrease Length/Width/Depth by (cm): depth by .1 Decrease Length/Width/Depth - Progress: Progressing toward goal Improve Drainage Characteristics: Min Improve Drainage Characteristics - Progress: Progressing toward goal  Goals will be updated until maximal potential achieved or discharge criteria met.  Discharge criteria: when goals achieved, discharge from hospital, MD decision/surgical intervention, no progress towards goals, refusal/missing three consecutive treatments without notification or medical reason.  GP     Jorge Amparo 07/18/2016, 11:30 AM Suanne Marker PT [phone]

## 2016-07-18 NOTE — Progress Notes (Signed)
INFECTIOUS DISEASE PROGRESS NOTE  ID: Wendy Douglas is a 40 y.o. female with  Active Problems:   COPD   Cellulitis of left hand   Cellulitis of hand   Trichomonosis   Polysubstance abuse   Malnutrition of moderate degree  Subjective: Without complaints  Abtx:  Anti-infectives    Start     Dose/Rate Route Frequency Ordered Stop   07/17/16 2300  vancomycin (VANCOCIN) 500 mg in sodium chloride 0.9 % 100 mL IVPB     500 mg 100 mL/hr over 60 Minutes Intravenous Every 8 hours 07/17/16 1328     07/17/16 1330  vancomycin (VANCOCIN) IVPB 1000 mg/200 mL premix     1,000 mg 200 mL/hr over 60 Minutes Intravenous  Once 07/17/16 1328 07/17/16 1604   07/16/16 1000  metroNIDAZOLE (FLAGYL) tablet 500 mg  Status:  Discontinued     500 mg Oral Every 12 hours 07/15/16 1847 07/17/16 1530   07/15/16 2200  metroNIDAZOLE (FLAGYL) tablet 500 mg  Status:  Discontinued     500 mg Oral Every 12 hours 07/15/16 1718 07/15/16 1847   07/15/16 2130  vancomycin (VANCOCIN) IVPB 750 mg/150 ml premix  Status:  Discontinued     750 mg 150 mL/hr over 60 Minutes Intravenous Every 12 hours 07/15/16 0904 07/15/16 1841   07/15/16 1730  clindamycin (CLEOCIN) IVPB 600 mg  Status:  Discontinued     600 mg 100 mL/hr over 30 Minutes Intravenous Every 8 hours 07/15/16 1718 07/17/16 1301   07/15/16 1130  metroNIDAZOLE (FLAGYL) tablet 2,000 mg     2,000 mg Oral  Once 07/15/16 1128 07/15/16 1135   07/15/16 0930  vancomycin (VANCOCIN) IVPB 1000 mg/200 mL premix     1,000 mg 200 mL/hr over 60 Minutes Intravenous  Once 07/15/16 0904 07/15/16 1051      Medications:  Scheduled: . Chlorhexidine Gluconate Cloth  6 each Topical Q0600  . feeding supplement (ENSURE ENLIVE)  237 mL Oral BID BM  . hydrOXYzine  25 mg Oral TID  . ipratropium-albuterol  3 mL Nebulization Once  . multivitamin with minerals  1 tablet Oral Daily  . mupirocin ointment  1 application Nasal BID  . nicotine  21 mg Transdermal Daily  . oxyCODONE  10 mg  Oral Once  . vancomycin  500 mg Intravenous Q8H    Objective: Vital signs in last 24 hours: Temp:  [98.1 F (36.7 C)-98.7 F (37.1 C)] 98.2 F (36.8 C) (09/15 1548) Pulse Rate:  [64-78] 78 (09/15 1548) Resp:  [18-20] 18 (09/15 1548) BP: (134-162)/(81-97) 162/85 (09/15 1548) SpO2:  [100 %] 100 % (09/15 1548)   General appearance: alert, cooperative and no distress Extremities: L hand wrapped.   Lab Results  Recent Labs  07/17/16 0910 07/18/16 0657  WBC 6.4 6.4  HGB 11.5* 11.6*  HCT 36.4 35.5*  NA 140 141  K 4.5 4.3  CL 110 111  CO2 22 26  BUN 8 9  CREATININE 0.79 0.72   Liver Panel No results for input(s): PROT, ALBUMIN, AST, ALT, ALKPHOS, BILITOT, BILIDIR, IBILI in the last 72 hours. Sedimentation Rate No results for input(s): ESRSEDRATE in the last 72 hours. C-Reactive Protein No results for input(s): CRP in the last 72 hours.  Microbiology: Recent Results (from the past 240 hour(s))  Blood culture (routine x 2)     Status: None (Preliminary result)   Collection Time: 07/15/16  8:16 AM  Result Value Ref Range Status   Specimen Description BLOOD  IV  Final   Special Requests IN PEDIATRIC BOTTLE  1CC  Final   Culture NO GROWTH 3 DAYS  Final   Report Status PENDING  Incomplete  Blood culture (routine x 2)     Status: Abnormal   Collection Time: 07/15/16  9:00 AM  Result Value Ref Range Status   Specimen Description BLOOD LEFT HAND  Final   Special Requests IN PEDIATRIC BOTTLE  4CC  Final   Culture  Setup Time   Final    GRAM POSITIVE RODS AEROBIC BOTTLE ONLY CRITICAL RESULT CALLED TO, READ BACK BY AND VERIFIED WITH: J. MILLEN, PHARMD AT 1625 ON 07/16/16 BY C. JESSUP, MLT.    Culture (A)  Final    BACILLUS SPECIES Standardized susceptibility testing for this organism is not available.    Report Status 07/18/2016 FINAL  Final  Blood Culture ID Panel (Reflexed)     Status: None   Collection Time: 07/15/16  9:00 AM  Result Value Ref Range Status    Enterococcus species NOT DETECTED NOT DETECTED Final   Listeria monocytogenes NOT DETECTED NOT DETECTED Final   Staphylococcus species NOT DETECTED NOT DETECTED Final   Staphylococcus aureus NOT DETECTED NOT DETECTED Final   Streptococcus species NOT DETECTED NOT DETECTED Final   Streptococcus agalactiae NOT DETECTED NOT DETECTED Final   Streptococcus pneumoniae NOT DETECTED NOT DETECTED Final   Streptococcus pyogenes NOT DETECTED NOT DETECTED Final   Acinetobacter baumannii NOT DETECTED NOT DETECTED Final   Enterobacteriaceae species NOT DETECTED NOT DETECTED Final   Enterobacter cloacae complex NOT DETECTED NOT DETECTED Final   Escherichia coli NOT DETECTED NOT DETECTED Final   Klebsiella oxytoca NOT DETECTED NOT DETECTED Final   Klebsiella pneumoniae NOT DETECTED NOT DETECTED Final   Proteus species NOT DETECTED NOT DETECTED Final   Serratia marcescens NOT DETECTED NOT DETECTED Final   Haemophilus influenzae NOT DETECTED NOT DETECTED Final   Neisseria meningitidis NOT DETECTED NOT DETECTED Final   Pseudomonas aeruginosa NOT DETECTED NOT DETECTED Final   Candida albicans NOT DETECTED NOT DETECTED Final   Candida glabrata NOT DETECTED NOT DETECTED Final   Candida krusei NOT DETECTED NOT DETECTED Final   Candida parapsilosis NOT DETECTED NOT DETECTED Final   Candida tropicalis NOT DETECTED NOT DETECTED Final  Urine culture     Status: Abnormal   Collection Time: 07/15/16 10:20 AM  Result Value Ref Range Status   Specimen Description URINE, RANDOM  Final   Special Requests NONE  Final   Culture <10,000 COLONIES/mL INSIGNIFICANT GROWTH (A)  Final   Report Status 07/16/2016 FINAL  Final  Surgical pcr screen     Status: Abnormal   Collection Time: 07/15/16  6:27 PM  Result Value Ref Range Status   MRSA, PCR POSITIVE (A) NEGATIVE Final   Staphylococcus aureus POSITIVE (A) NEGATIVE Final    Comment:        The Xpert SA Assay (FDA approved for NASAL specimens in patients over 21 years  of age), is one component of a comprehensive surveillance program.  Test performance has been validated by [facility] for patients greater than or equal to 18 year old. It is not intended to diagnose infection nor to guide or monitor treatment. CRITICAL RESULT CALLED TO, READ BACK BY AND VERIFIED WITH: CBilly Coast, RN AT 2027 ON 07/15/16 BY C. JESSUP, MLT.   Aerobic/Anaerobic Culture (surgical/deep wound)     Status: None (Preliminary result)   Collection Time: 07/15/16  7:51 PM  Result Value Ref Range Status  Specimen Description ABSCESS LEFT HAND  Final   Special Requests PT ON FLAGYL DORSAL LEFT HAND SWAB  Final   Gram Stain   Final    FEW WBC PRESENT,BOTH PMN AND MONONUCLEAR FEW GRAM POSITIVE COCCI IN CLUSTERS    Culture   Final    MODERATE METHICILLIN RESISTANT STAPHYLOCOCCUS AUREUS NO ANAEROBES ISOLATED; CULTURE IN PROGRESS FOR 5 DAYS    Report Status PENDING  Incomplete   Organism ID, Bacteria METHICILLIN RESISTANT STAPHYLOCOCCUS AUREUS  Final      Susceptibility   Methicillin resistant staphylococcus aureus - MIC*    CIPROFLOXACIN <=0.5 SENSITIVE Sensitive     ERYTHROMYCIN >=8 RESISTANT Resistant     GENTAMICIN <=0.5 SENSITIVE Sensitive     OXACILLIN >=4 RESISTANT Resistant     TETRACYCLINE <=1 SENSITIVE Sensitive     VANCOMYCIN 1 SENSITIVE Sensitive     TRIMETH/SULFA <=10 SENSITIVE Sensitive     CLINDAMYCIN <=0.25 SENSITIVE Sensitive     RIFAMPIN <=0.5 SENSITIVE Sensitive     Inducible Clindamycin NEGATIVE Sensitive     * MODERATE METHICILLIN RESISTANT STAPHYLOCOCCUS AUREUS  Aerobic/Anaerobic Culture (surgical/deep wound)     Status: None (Preliminary result)   Collection Time: 07/15/16  7:54 PM  Result Value Ref Range Status   Specimen Description TISSUE LEFT HAND  Final   Special Requests DOSAL  PT ON FLAGYL  Final   Gram Stain   Final    MODERATE WBC PRESENT,BOTH PMN AND MONONUCLEAR RARE GRAM POSITIVE COCCI IN PAIRS    Culture   Final    MODERATE  METHICILLIN RESISTANT STAPHYLOCOCCUS AUREUS NO ANAEROBES ISOLATED; CULTURE IN PROGRESS FOR 5 DAYS    Report Status PENDING  Incomplete   Organism ID, Bacteria METHICILLIN RESISTANT STAPHYLOCOCCUS AUREUS  Final      Susceptibility   Methicillin resistant staphylococcus aureus - MIC*    CIPROFLOXACIN <=0.5 SENSITIVE Sensitive     ERYTHROMYCIN >=8 RESISTANT Resistant     GENTAMICIN <=0.5 SENSITIVE Sensitive     OXACILLIN >=4 RESISTANT Resistant     TETRACYCLINE <=1 SENSITIVE Sensitive     VANCOMYCIN 1 SENSITIVE Sensitive     TRIMETH/SULFA <=10 SENSITIVE Sensitive     CLINDAMYCIN <=0.25 SENSITIVE Sensitive     RIFAMPIN <=0.5 SENSITIVE Sensitive     Inducible Clindamycin NEGATIVE Sensitive     * MODERATE METHICILLIN RESISTANT STAPHYLOCOCCUS AUREUS    Studies/Results: No results found.   Assessment/Plan: L hand abscess MRSA  IVDA Hepatitis C  Total days of antibiotics: 3 vancomycin  Would continue vanco as long as she will let us keep her Hand f/u Home with bactrim when she leaves, at least 2 weeks.          Johny SaxJeffrey Hatcher Infectious Diseases (pager) [phone] [email or web address] 07/18/2016, 6:06 PM  LOS: 3 days

## 2016-07-18 NOTE — Progress Notes (Signed)
Subjective: 3 Days Post-Op Procedure(s) (LRB): IRRIGATION AND DEBRIDEMENT LEFT HAND WITH TENOSYNOVECTOMY (Left) Patient reports she feels "bad" today and is suffering from withdrawal. She states hand hurts more today. She c/o diarrhea, nausea, body aches, and "sneezing". She states she feels that medicine is not strong enough.   Objective: Vital signs in last 24 hours: Temp:  [98.1 F (36.7 C)-98.7 F (37.1 C)] 98.2 F (36.8 C) (09/15 1548) Pulse Rate:  [64-78] 78 (09/15 1548) Resp:  [18-20] 18 (09/15 1548) BP: (134-162)/(81-97) 162/85 (09/15 1548) SpO2:  [100 %] 100 % (09/15 1548)  Intake/Output from previous day: 09/14 0701 - 09/15 0700 In: 3350 [P.O.:900; I.V.:2100; IV Piggyback:350] Out: 750 [Urine:750] Intake/Output this shift: Total I/O In: 2241.7 [P.O.:560; I.V.:1481.7; IV Piggyback:200] Out: -    Recent Labs  07/16/16 0315 07/17/16 0910 07/18/16 0657  HGB 11.8* 11.5* 11.6*    Recent Labs  07/17/16 0910 07/18/16 0657  WBC 6.4 6.4  RBC 3.73* 3.69*  HCT 36.4 35.5*  PLT 164 162    Recent Labs  07/17/16 0910 07/18/16 0657  NA 140 141  K 4.5 4.3  CL 110 111  CO2 22 26  BUN 8 9  CREATININE 0.79 0.72  GLUCOSE 127* 87  CALCIUM 8.5* 8.7*   No results for input(s): LABPT, INR in the last 72 hours.  Patient is fairly sedate, minimally conversant LUE: wounds are clean, healthy granulation tissue present, no purulent d/c minimal serous drainage cellulitis markedly improved, rom improved Assessment/Plan: 3 Days Post-Op Procedure(s) (LRB): IRRIGATION AND DEBRIDEMENT LEFT HAND WITH TENOSYNOVECTOMY (Left) Continue Medical management for withdrawal, continue IV Abx and daily wound care, hydrotherapy and teach wet to dry dressings once patient medically stable for d/c.  Lamerle Jabs L 07/18/2016, 6:15 PM

## 2016-07-18 NOTE — Progress Notes (Signed)
[facility]                                                                              Patient Demographics  Wendy Douglas, is a 40 y.o. female, DOB - 1976/03/12, ONG:[id number]  Admit date - 07/15/2016   Admitting Physician Ozella Rocks, MD  Outpatient Primary MD for the patient is No PCP Per Patient  Outpatient specialists:   LOS - 3  days    Chief Complaint  Patient presents with  . Arm Swelling  . Hand Injury       Brief summary   Wendy Douglas is a 40 y.o. female with medical history significant of COPD, polysubstance abuse/IVDU, presenting with left hand swelling pain in the redness. Started 1 day Prior to admission, the patient reported  attempted cocaine injection between the fourth and fifth fingers on the dorsum of the hand 3 days ago. States that this did not go into the vein directly. Symptoms are constant and getting worse. Denies fevers, chills, neck stiffness, headache, palpitations, chest pain, shortness of breath, nausea, vomiting, diarrhea, dysuria, frequency, vaginal discharge, back pain.    Assessment & Plan   Cellulitis and abscess of hand: Likely secondary to attempted cocaine injection hand.  - MRI showed severe cellulitis of the dorsal aspect of the left hand with complex infiltrative abscess located both superficial and deep to the extensor digitorum tendons, largest abscess 2.2 x 8 x 0.8 centimeter and infectious tenosynovitis. - Hand surgery was consulted, appreciate Dr. Carlos Levering assistance. Patient underwentirrigation and debridement of the deep abscess and radical tenosynovectomy on 9/12 - Management per hand surgery.Continue hydrotherapy - wound Cultures growing MRSA,  Appreciate ID recommendations, IV antibiotics while inpatient, likely oral when discharged  Trichomonas: present on UA (contaminated sample). Suspect vaginal infection.  - Completed metronidazole  COPD: With slight wheezing - continue albuterol    Polysubstance use: Patient's UDS positive for opiates, cocaine and THC. - Per patient she uses heroin 1 g daily.Currently not in withdrawals, will continue IV morphine as needed. If patient goes into withdrawals, will need to be on clonidine detox protocol - Discussed in detail with the patient, she is reported that she was in a methadone program for 7 years and 6 months ago she quit and started using heroin. She is no longer on methadone.  Nicotine abuse: - Patient smokes half pack per day. - Placed on nicotine patch    Depression/Anxiety: - continue vistaril, Abilify, Celexa   Code Status: Full code DVT Prophylaxis:  SCD's Family Communication: Discussed in detail with the patient, all imaging results, lab results explained to the patient    Disposition Plan:   Time Spent in minutes 15 minutes  Procedures:  PROCEDURES: 1. Irrigation and debridement of deep abscess, skin, subcutaneous     tissue, tendon, muscle and bone/periosteum, underwent excisional     debridement with curette, knife and scissor.  This was an 8-cm in     length to various pie-crusted incisions and 2-3 cm in depth,     irrigation and debridement. 2. Radical tenosynovectomy of the extensor apparatus involving the     second, fourth, fifth  compartments, dorsal hand, wrist and forearm  Consultants:   Hand surgery, Dr Amanda Pea  Antimicrobials:   IV clindamycin   Medications  Scheduled Meds: . Chlorhexidine Gluconate Cloth  6 each Topical Q0600  . feeding supplement (ENSURE ENLIVE)  237 mL Oral BID BM  . hydrOXYzine  25 mg Oral TID  . ipratropium-albuterol  3 mL Nebulization Once  . multivitamin with minerals  1 tablet Oral Daily  . mupirocin ointment  1 application Nasal BID  . nicotine  21 mg Transdermal Daily  . oxyCODONE  10 mg Oral Once  . vancomycin  500 mg Intravenous Q8H   Continuous Infusions: . sodium chloride 100 mL/hr at 07/18/16 1211   PRN Meds:.acetaminophen **OR**  acetaminophen, ipratropium-albuterol, ketorolac, morphine injection, ondansetron **OR** ondansetron (ZOFRAN) IV, oxyCODONE   Antibiotics   Anti-infectives    Start     Dose/Rate Route Frequency Ordered Stop   07/17/16 2300  vancomycin (VANCOCIN) 500 mg in sodium chloride 0.9 % 100 mL IVPB     500 mg 100 mL/hr over 60 Minutes Intravenous Every 8 hours 07/17/16 1328     07/17/16 1330  vancomycin (VANCOCIN) IVPB 1000 mg/200 mL premix     1,000 mg 200 mL/hr over 60 Minutes Intravenous  Once 07/17/16 1328 07/17/16 1604   07/16/16 1000  metroNIDAZOLE (FLAGYL) tablet 500 mg  Status:  Discontinued     500 mg Oral Every 12 hours 07/15/16 1847 07/17/16 1530   07/15/16 2200  metroNIDAZOLE (FLAGYL) tablet 500 mg  Status:  Discontinued     500 mg Oral Every 12 hours 07/15/16 1718 07/15/16 1847   07/15/16 2130  vancomycin (VANCOCIN) IVPB 750 mg/150 ml premix  Status:  Discontinued     750 mg 150 mL/hr over 60 Minutes Intravenous Every 12 hours 07/15/16 0904 07/15/16 1841   07/15/16 1730  clindamycin (CLEOCIN) IVPB 600 mg  Status:  Discontinued     600 mg 100 mL/hr over 30 Minutes Intravenous Every 8 hours 07/15/16 1718 07/17/16 1301   07/15/16 1130  metroNIDAZOLE (FLAGYL) tablet 2,000 mg     2,000 mg Oral  Once 07/15/16 1128 07/15/16 1135   07/15/16 0930  vancomycin (VANCOCIN) IVPB 1000 mg/200 mL premix     1,000 mg 200 mL/hr over 60 Minutes Intravenous  Once 07/15/16 0904 07/15/16 1051        Subjective:   Wendy Douglas was seen and examined today.States morphine is not lasting for 4 hours, wants to get the pain medications increased. Not in any withdrawals.. Patient denies dizziness, chest pain, shortness of breath, abdominal pain, N/V/D/C, new weakness, numbess, tingling. No acute events overnight.    Objective:   Vitals:   07/17/16 0454 07/17/16 1413 07/17/16 2052 07/18/16 0521  BP: 138/71 123/69 (!) 145/97 134/81  Pulse: 62 (!) 57 64 68  Resp: 16 18 20 20   Temp: 98.6 F (37 C)  98.6 F (37 C) 98.7 F (37.1 C) 98.1 F (36.7 C)  TempSrc: Oral Oral Oral Oral  SpO2: 100% 100% 100% 100%  Weight:      Height:        Intake/Output Summary (Last 24 hours) at 07/18/16 1504 Last data filed at 07/18/16 0902  Gross per 24 hour  Intake             2430 ml  Output                0 ml  Net  2430 ml     Wt Readings from Last 3 Encounters:  07/15/16 56.7 kg (125 lb)  05/31/16 54.4 kg (120 lb)  12/15/14 68 kg (150 lb)     Exam  General: Alert and oriented x 3, NAD  HEENT:    Neck:   Cardiovascular: S1 S2 clear, RRR  Respiratory: Clear to auscultation bilaterally, no wheezing, rales or rhonchi  Gastrointestinal: Soft, nontender, nondistended, + bowel sounds  Ext: no cyanosis clubbing or edema. Left hand with dressing intact  Neuro: no new deficits   Skin: No rashes  Psych: Normal affect and demeanor, alert and oriented x3    Data Reviewed:  I have personally reviewed following labs and imaging studies  Micro Results Recent Results (from the past 240 hour(s))  Blood culture (routine x 2)     Status: None (Preliminary result)   Collection Time: 07/15/16  8:16 AM  Result Value Ref Range Status   Specimen Description BLOOD  IV  Final   Special Requests IN PEDIATRIC BOTTLE  1CC  Final   Culture NO GROWTH 2 DAYS  Final   Report Status PENDING  Incomplete  Blood culture (routine x 2)     Status: Abnormal   Collection Time: 07/15/16  9:00 AM  Result Value Ref Range Status   Specimen Description BLOOD LEFT HAND  Final   Special Requests IN PEDIATRIC BOTTLE  4CC  Final   Culture  Setup Time   Final    GRAM POSITIVE RODS AEROBIC BOTTLE ONLY CRITICAL RESULT CALLED TO, READ BACK BY AND VERIFIED WITH: J. MILLEN, PHARMD AT 1625 ON 07/16/16 BY C. JESSUP, MLT.    Culture (A)  Final    BACILLUS SPECIES Standardized susceptibility testing for this organism is not available.    Report Status 07/18/2016 FINAL  Final  Blood Culture ID Panel  (Reflexed)     Status: None   Collection Time: 07/15/16  9:00 AM  Result Value Ref Range Status   Enterococcus species NOT DETECTED NOT DETECTED Final   Listeria monocytogenes NOT DETECTED NOT DETECTED Final   Staphylococcus species NOT DETECTED NOT DETECTED Final   Staphylococcus aureus NOT DETECTED NOT DETECTED Final   Streptococcus species NOT DETECTED NOT DETECTED Final   Streptococcus agalactiae NOT DETECTED NOT DETECTED Final   Streptococcus pneumoniae NOT DETECTED NOT DETECTED Final   Streptococcus pyogenes NOT DETECTED NOT DETECTED Final   Acinetobacter baumannii NOT DETECTED NOT DETECTED Final   Enterobacteriaceae species NOT DETECTED NOT DETECTED Final   Enterobacter cloacae complex NOT DETECTED NOT DETECTED Final   Escherichia coli NOT DETECTED NOT DETECTED Final   Klebsiella oxytoca NOT DETECTED NOT DETECTED Final   Klebsiella pneumoniae NOT DETECTED NOT DETECTED Final   Proteus species NOT DETECTED NOT DETECTED Final   Serratia marcescens NOT DETECTED NOT DETECTED Final   Haemophilus influenzae NOT DETECTED NOT DETECTED Final   Neisseria meningitidis NOT DETECTED NOT DETECTED Final   Pseudomonas aeruginosa NOT DETECTED NOT DETECTED Final   Candida albicans NOT DETECTED NOT DETECTED Final   Candida glabrata NOT DETECTED NOT DETECTED Final   Candida krusei NOT DETECTED NOT DETECTED Final   Candida parapsilosis NOT DETECTED NOT DETECTED Final   Candida tropicalis NOT DETECTED NOT DETECTED Final  Urine culture     Status: Abnormal   Collection Time: 07/15/16 10:20 AM  Result Value Ref Range Status   Specimen Description URINE, RANDOM  Final   Special Requests NONE  Final   Culture <10,000 COLONIES/mL INSIGNIFICANT GROWTH (A)  Final   Report Status 07/16/2016 FINAL  Final  Surgical pcr screen     Status: Abnormal   Collection Time: 07/15/16  6:27 PM  Result Value Ref Range Status   MRSA, PCR POSITIVE (A) NEGATIVE Final   Staphylococcus aureus POSITIVE (A) NEGATIVE Final     Comment:        The Xpert SA Assay (FDA approved for NASAL specimens in patients over 13 years of age), is one component of a comprehensive surveillance program.  Test performance has been validated by [facility] for patients greater than or equal to 78 year old. It is not intended to diagnose infection nor to guide or monitor treatment. CRITICAL RESULT CALLED TO, READ BACK BY AND VERIFIED WITH: CBilly Coast, RN AT 2027 ON 07/15/16 BY C. JESSUP, MLT.   Aerobic/Anaerobic Culture (surgical/deep wound)     Status: None (Preliminary result)   Collection Time: 07/15/16  7:51 PM  Result Value Ref Range Status   Specimen Description ABSCESS LEFT HAND  Final   Special Requests PT ON FLAGYL DORSAL LEFT HAND SWAB  Final   Gram Stain   Final    FEW WBC PRESENT,BOTH PMN AND MONONUCLEAR FEW GRAM POSITIVE COCCI IN CLUSTERS    Culture   Final    MODERATE METHICILLIN RESISTANT STAPHYLOCOCCUS AUREUS NO ANAEROBES ISOLATED; CULTURE IN PROGRESS FOR 5 DAYS    Report Status PENDING  Incomplete   Organism ID, Bacteria METHICILLIN RESISTANT STAPHYLOCOCCUS AUREUS  Final      Susceptibility   Methicillin resistant staphylococcus aureus - MIC*    CIPROFLOXACIN <=0.5 SENSITIVE Sensitive     ERYTHROMYCIN >=8 RESISTANT Resistant     GENTAMICIN <=0.5 SENSITIVE Sensitive     OXACILLIN >=4 RESISTANT Resistant     TETRACYCLINE <=1 SENSITIVE Sensitive     VANCOMYCIN 1 SENSITIVE Sensitive     TRIMETH/SULFA <=10 SENSITIVE Sensitive     CLINDAMYCIN <=0.25 SENSITIVE Sensitive     RIFAMPIN <=0.5 SENSITIVE Sensitive     Inducible Clindamycin NEGATIVE Sensitive     * MODERATE METHICILLIN RESISTANT STAPHYLOCOCCUS AUREUS  Aerobic/Anaerobic Culture (surgical/deep wound)     Status: None (Preliminary result)   Collection Time: 07/15/16  7:54 PM  Result Value Ref Range Status   Specimen Description TISSUE LEFT HAND  Final   Special Requests DOSAL  PT ON FLAGYL  Final   Gram Stain   Final    MODERATE WBC  PRESENT,BOTH PMN AND MONONUCLEAR RARE GRAM POSITIVE COCCI IN PAIRS    Culture   Final    MODERATE METHICILLIN RESISTANT STAPHYLOCOCCUS AUREUS NO ANAEROBES ISOLATED; CULTURE IN PROGRESS FOR 5 DAYS    Report Status PENDING  Incomplete   Organism ID, Bacteria METHICILLIN RESISTANT STAPHYLOCOCCUS AUREUS  Final      Susceptibility   Methicillin resistant staphylococcus aureus - MIC*    CIPROFLOXACIN <=0.5 SENSITIVE Sensitive     ERYTHROMYCIN >=8 RESISTANT Resistant     GENTAMICIN <=0.5 SENSITIVE Sensitive     OXACILLIN >=4 RESISTANT Resistant     TETRACYCLINE <=1 SENSITIVE Sensitive     VANCOMYCIN 1 SENSITIVE Sensitive     TRIMETH/SULFA <=10 SENSITIVE Sensitive     CLINDAMYCIN <=0.25 SENSITIVE Sensitive     RIFAMPIN <=0.5 SENSITIVE Sensitive     Inducible Clindamycin NEGATIVE Sensitive     * MODERATE METHICILLIN RESISTANT STAPHYLOCOCCUS AUREUS    Radiology Reports Dg Chest 2 View  Result Date: 07/15/2016 CLINICAL DATA:  Rhonchi on chest auscultation.  Smoker. EXAM: CHEST  2 VIEW COMPARISON:  Single-view of the chest 05/31/2016. PA and lateral chest 12/15/2014. CT chest 11/15/2012. FINDINGS: The chest is hyperexpanded with peribronchial thickening. A small focus of airspace opacity is seen in the right upper lobe on the frontal pain and appears new since the most recent examination. Heart size is normal. No pneumothorax or pleural effusion. IMPRESSION: Findings compatible with COPD. Small focus of airspace opacity in the right upper lobe may be atelectasis or infection. Recommend repeat PA and lateral films in 3-4 weeks to ensure resolution. Electronically Signed   By: Drusilla Kanner M.D.   On: 07/15/2016 15:02   Dg Forearm Left  Result Date: 07/15/2016 CLINICAL DATA:  Left hand and 4 arm swelling. Complains of numbness and tingling. EXAM: LEFT FOREARM - 2 VIEW COMPARISON:  None. FINDINGS: Frontal and lateral views of the left forearm demonstrate diffuse subcutaneous soft tissue swelling  of the left forearm. There is no acute displaced fracture. No significant elbow effusion. Radial head alignment normal. No radiopaque foreign body. No gas collections in the soft tissues. IMPRESSION: 1. Diffuse soft tissue swelling of the left forearm 2. No radiographic evidence for acute osseous abnormality Electronically Signed   By: Jasmine Pang M.D.   On: 07/15/2016 11:58   Mr Hand Left W Wo Contrast  Result Date: 07/15/2016 CLINICAL DATA:  Redness and swelling of the hand. Presents with left hand and distal left arm swelling over the past several hours. EXAM: MR OF THE LEFT WRIST WITHOUT AND WITH CONTRAST TECHNIQUE: Multiplanar multisequence MR imaging of the left wrist was performed both before and after the administration of intravenous contrast. CONTRAST:  10mL MULTIHANCE GADOBENATE DIMEGLUMINE 529 MG/ML IV SOLN COMPARISON:  None. FINDINGS: Ligaments:  Intact scapholunate and lunotriquetral ligaments. Triangular fibrocartilage: Central perforation of the TFCC. Tendons: Intact flexor compartment tendons. Tenosynovitis of the extensor digitorum, extensor carpi radialis brevis and extensor carpi radialis longus. Carpal tunnel/median nerve: Normal carpal tunnel. Normal median nerve. Guyon's canal: Normal. Joint/cartilage: No joint effusion. No chondral defect. Bones/carpal alignment: No marrow signal abnormality. Normal alignment. No aggressive osseous lesion. Other: Severe soft tissue swelling enhancement of the dorsal aspect of the to lesser extent volar aspect of the hand along the thenar are eminence. Complex fluid collection infiltrating the dorsal soft tissues with the largest fluid collection measuring 2.2 x 81.4 x 0.8 cm at the level of the third-fourth mid metacarpal with a focal area extending deep to the extensor digitorum of the fourth digit at the level of the MCP joint. The fluid collection is located both superficial and deep to the extensor digitorum tendons of the second, third, fourth and  fifth digits. IMPRESSION: 1. Severe cellulitis of the dorsal aspect of the left hand with a complex infiltrative abscess along the dorsal aspect of the hand which is located both superficial and deep to the extensor digitorum tendons. Largest abscess measures 2.2 x 81.4 x 0.8 cm at the level of the third-fourth mid metacarpal with a focal area extending deep to the extensor digitorum of the fourth digit at the level of the MCP joint. 2. Tenosynovitis of the extensor digitorum, extensor carpi radialis brevis and extensor carpi radialis longus. These results were called by telephone at the time of interpretation on 07/15/2016 at 6:18 pm to Dr. Amanda Pea, who verbally acknowledged these results. Electronically Signed   By: Elige Ko   On: 07/15/2016 18:19   Dg Hand Complete Left  Result Date: 07/15/2016 CLINICAL DATA:  Left hand swelling that started a few days ago after injected  hand with cocaine. For arm numbness and tingling. EXAM: LEFT HAND - COMPLETE 3+ VIEW COMPARISON:  None. FINDINGS: PA oblique and lateral views left hand. No acute displaced fracture or malalignment. Diffuse soft tissue swelling over the dorsum of the hand. No radiopaque foreign body or soft tissue gas collections. IMPRESSION: Marked swelling over the dorsum of the left hand. No acute underlying osseous abnormality. Electronically Signed   By: Jasmine PangKim  Fujinaga M.D.   On: 07/15/2016 11:57    Lab Data:  CBC:  Recent Labs Lab 07/15/16 0816 07/16/16 0315 07/17/16 0910 07/18/16 0657  WBC 12.3* 10.2 6.4 6.4  NEUTROABS 8.9*  --   --   --   HGB 14.2 11.8* 11.5* 11.6*  HCT 42.4 36.7 36.4 35.5*  MCV 95.3 97.1 97.6 96.2  PLT 216 191 164 162   Basic Metabolic Panel:  Recent Labs Lab 07/15/16 0816 07/16/16 0315 07/17/16 0910 07/18/16 0657  NA 135 138 140 141  K 3.9 3.8 4.5 4.3  CL 102 105 110 111  CO2 22 27 22 26   GLUCOSE 98 102* 127* 87  BUN 8 8 8 9   CREATININE 0.69 0.85 0.79 0.72  CALCIUM 9.2 8.7* 8.5* 8.7*    GFR: Estimated Creatinine Clearance: 82.8 mL/min (by C-G formula based on SCr of 0.72 mg/dL). Liver Function Tests:  Recent Labs Lab 07/15/16 0816  AST 22  ALT 18  ALKPHOS 123  BILITOT 0.6  PROT 7.8  ALBUMIN 4.1   No results for input(s): LIPASE, AMYLASE in the last 168 hours. No results for input(s): AMMONIA in the last 168 hours. Coagulation Profile: No results for input(s): INR, PROTIME in the last 168 hours. Cardiac Enzymes: No results for input(s): CKTOTAL, CKMB, CKMBINDEX, TROPONINI in the last 168 hours. BNP (last 3 results) No results for input(s): PROBNP in the last 8760 hours. HbA1C: No results for input(s): HGBA1C in the last 72 hours. CBG: No results for input(s): GLUCAP in the last 168 hours. Lipid Profile: No results for input(s): CHOL, HDL, LDLCALC, TRIG, CHOLHDL, LDLDIRECT in the last 72 hours. Thyroid Function Tests: No results for input(s): TSH, T4TOTAL, FREET4, T3FREE, THYROIDAB in the last 72 hours. Anemia Panel: No results for input(s): VITAMINB12, FOLATE, FERRITIN, TIBC, IRON, RETICCTPCT in the last 72 hours. Urine analysis:    Component Value Date/Time   COLORURINE COLORLESS (A) 07/15/2016 1020   APPEARANCEUR CLEAR 07/15/2016 1020   LABSPEC <1.005 (L) 07/15/2016 1020   PHURINE 6.0 07/15/2016 1020   GLUCOSEU NEGATIVE 07/15/2016 1020   HGBUR TRACE (A) 07/15/2016 1020   BILIRUBINUR NEGATIVE 07/15/2016 1020   KETONESUR NEGATIVE 07/15/2016 1020   PROTEINUR NEGATIVE 07/15/2016 1020   UROBILINOGEN 0.2 03/22/2010 1131   NITRITE NEGATIVE 07/15/2016 1020   LEUKOCYTESUR LARGE (A) 07/15/2016 1020     Wendy Douglas M.D. [facility] 07/18/2016, 3:04 PM  Pager: [phone] Between 7am to 7pm - call Pager - [phone]  After 7pm go to [email or web address] - password [email or web address]  Call night coverage person covering after 7pm

## 2016-07-19 LAB — CBC
HCT: 34.2 % — ABNORMAL LOW (ref 36.0–46.0)
HEMOGLOBIN: 11.1 g/dL — AB (ref 12.0–15.0)
MCH: 31.4 pg (ref 26.0–34.0)
MCHC: 32.5 g/dL (ref 30.0–36.0)
MCV: 96.6 fL (ref 78.0–100.0)
PLATELETS: 150 10*3/uL (ref 150–400)
RBC: 3.54 MIL/uL — AB (ref 3.87–5.11)
RDW: 13.9 % (ref 11.5–15.5)
WBC: 5.9 10*3/uL (ref 4.0–10.5)

## 2016-07-19 LAB — BASIC METABOLIC PANEL
ANION GAP: 7 (ref 5–15)
BUN: 11 mg/dL (ref 6–20)
CHLORIDE: 106 mmol/L (ref 101–111)
CO2: 24 mmol/L (ref 22–32)
Calcium: 8.5 mg/dL — ABNORMAL LOW (ref 8.9–10.3)
Creatinine, Ser: 0.65 mg/dL (ref 0.44–1.00)
GFR calc Af Amer: >60 mL/min (ref >60)
GLUCOSE: 119 mg/dL — AB (ref 65–99)
POTASSIUM: 3.7 mmol/L (ref 3.5–5.1)
Sodium: 137 mmol/L (ref 135–145)

## 2016-07-19 LAB — VANCOMYCIN, TROUGH: VANCOMYCIN TR: 5 ug/mL — AB (ref 15–20)

## 2016-07-19 MED ORDER — VANCOMYCIN HCL IN DEXTROSE 750-5 MG/150ML-% IV SOLN
750.0000 mg | Freq: Three times a day (TID) | INTRAVENOUS | Status: DC
  Administered 2016-07-20 – 2016-07-22 (×8): 750 mg via INTRAVENOUS
  Filled 2016-07-19 (×12): qty 150

## 2016-07-19 MED ORDER — VANCOMYCIN HCL IN DEXTROSE 1-5 GM/200ML-% IV SOLN
1000.0000 mg | Freq: Once | INTRAVENOUS | Status: AC
  Administered 2016-07-19: 1000 mg via INTRAVENOUS
  Filled 2016-07-19: qty 200

## 2016-07-19 NOTE — Progress Notes (Signed)
[facility]                                                                              Patient Demographics  Wendy Douglas, is a 40 y.o. female, DOB - 01/14/1976, QMV:[id number]  Admit date - 07/15/2016   Admitting Physician Ozella Rocks, MD  Outpatient Primary MD for the patient is No PCP Per Patient  Outpatient specialists:   LOS - 4  days    Chief Complaint  Patient presents with  . Arm Swelling  . Hand Injury       Brief summary   Wendy Douglas is a 40 y.o. female with medical history significant of COPD, polysubstance abuse/IVDU, presenting with left hand swelling pain in the redness. Started 1 day Prior to admission, the patient reported  attempted cocaine injection between the fourth and fifth fingers on the dorsum of the hand 3 days ago. States that this did not go into the vein directly. Symptoms are constant and getting worse. Denies fevers, chills, neck stiffness, headache, palpitations, chest pain, shortness of breath, nausea, vomiting, diarrhea, dysuria, frequency, vaginal discharge, back pain.    Assessment & Plan   Cellulitis and abscess of hand: Likely secondary to attempted cocaine injection hand.  - MRI showed severe cellulitis of the dorsal aspect of the left hand with complex infiltrative abscess located both superficial and deep to the extensor digitorum tendons, largest abscess 2.2 x 8 x 0.8 centimeter and infectious tenosynovitis. - Hand surgery was consulted, appreciate Dr. Carlos Levering assistance. Patient underwentirrigation and debridement of the deep abscess and radical tenosynovectomy on 9/12 - Management per hand surgery - wound Cultures growing MRSA,  Appreciate ID recommendations, IV antibiotics while inpatient, Bactrim when discharged  Trichomonas: present on UA (contaminated sample). Suspect vaginal infection.  - Completed metronidazole  COPD: With slight wheezing - continue albuterol   Polysubstance use: Patient's UDS  positive for opiates, cocaine and THC. - Per patient she uses heroin 1 g daily.Currently not in withdrawals, patient appears to be stable on my examination - will continue IV morphine as needed.  - Discussed in detail with the patient, she is reported that she was in a methadone program for 7 years and 6 months ago she quit and started using heroin. She is no longer on methadone.   Nicotine abuse: - Patient smokes half pack per day. - Placed on nicotine patch    Depression/Anxiety: - continue vistaril, Abilify, Celexa   Code Status: Full code DVT Prophylaxis:  SCD's Family Communication: Discussed in detail with the patient, all imaging results, lab results explained to the patient    Disposition Plan: Tomorrow if okay with hand surgery  Time Spent in minutes 15 minutes  Procedures:  PROCEDURES: 1. Irrigation and debridement of deep abscess, skin, subcutaneous     tissue, tendon, muscle and bone/periosteum, underwent excisional     debridement with curette, knife and scissor.  This was an 8-cm in     length to various pie-crusted incisions and 2-3 cm in depth,     irrigation and debridement. 2. Radical tenosynovectomy of the extensor apparatus involving the     second, fourth,  fifth compartments, dorsal hand, wrist and forearm  Consultants:   Hand surgery, Dr Amanda PeaGramig ID  Antimicrobials:   IV vancomycin   Medications  Scheduled Meds: . Chlorhexidine Gluconate Cloth  6 each Topical Q0600  . feeding supplement (ENSURE ENLIVE)  237 mL Oral BID BM  . hydrOXYzine  25 mg Oral TID  . ipratropium-albuterol  3 mL Nebulization Once  . multivitamin with minerals  1 tablet Oral Daily  . mupirocin ointment  1 application Nasal BID  . nicotine  21 mg Transdermal Daily  . oxyCODONE  10 mg Oral Once  . vancomycin  500 mg Intravenous Q8H   Continuous Infusions: . sodium chloride 1 mL (07/19/16 0642)   PRN Meds:.acetaminophen **OR** acetaminophen, ipratropium-albuterol,  ketorolac, morphine injection, ondansetron **OR** ondansetron (ZOFRAN) IV, oxyCODONE   Antibiotics   Anti-infectives    Start     Dose/Rate Route Frequency Ordered Stop   07/17/16 2300  vancomycin (VANCOCIN) 500 mg in sodium chloride 0.9 % 100 mL IVPB     500 mg 100 mL/hr over 60 Minutes Intravenous Every 8 hours 07/17/16 1328     07/17/16 1330  vancomycin (VANCOCIN) IVPB 1000 mg/200 mL premix     1,000 mg 200 mL/hr over 60 Minutes Intravenous  Once 07/17/16 1328 07/17/16 1604   07/16/16 1000  metroNIDAZOLE (FLAGYL) tablet 500 mg  Status:  Discontinued     500 mg Oral Every 12 hours 07/15/16 1847 07/17/16 1530   07/15/16 2200  metroNIDAZOLE (FLAGYL) tablet 500 mg  Status:  Discontinued     500 mg Oral Every 12 hours 07/15/16 1718 07/15/16 1847   07/15/16 2130  vancomycin (VANCOCIN) IVPB 750 mg/150 ml premix  Status:  Discontinued     750 mg 150 mL/hr over 60 Minutes Intravenous Every 12 hours 07/15/16 0904 07/15/16 1841   07/15/16 1730  clindamycin (CLEOCIN) IVPB 600 mg  Status:  Discontinued     600 mg 100 mL/hr over 30 Minutes Intravenous Every 8 hours 07/15/16 1718 07/17/16 1301   07/15/16 1130  metroNIDAZOLE (FLAGYL) tablet 2,000 mg     2,000 mg Oral  Once 07/15/16 1128 07/15/16 1135   07/15/16 0930  vancomycin (VANCOCIN) IVPB 1000 mg/200 mL premix     1,000 mg 200 mL/hr over 60 Minutes Intravenous  Once 07/15/16 0904 07/15/16 1051        Subjective:   Wendy Douglas was seen and examined today. Feels a lot better today, no significant complaints.  Not in any withdrawals.. Patient denies dizziness, chest pain, shortness of breath, abdominal pain, N/V/D/C, new weakness, numbess, tingling. No acute events overnight.    Objective:   Vitals:   07/18/16 0521 07/18/16 1548 07/18/16 2206 07/19/16 0523  BP: 134/81 (!) 162/85 125/62 130/66  Pulse: 68 78 78 76  Resp: 20 18 18 16   Temp: 98.1 F (36.7 C) 98.2 F (36.8 C) 98.4 F (36.9 C) 98.1 F (36.7 C)  TempSrc: Oral Oral  Oral Oral  SpO2: 100% 100% 100% 100%  Weight:      Height:        Intake/Output Summary (Last 24 hours) at 07/19/16 1106 Last data filed at 07/19/16 0900  Gross per 24 hour  Intake             4955 ml  Output                0 ml  Net             4955 ml  Wt Readings from Last 3 Encounters:  07/15/16 56.7 kg (125 lb)  05/31/16 54.4 kg (120 lb)  12/15/14 68 kg (150 lb)     Exam  General: Alert and oriented x 3, NAD  HEENT:    Neck:   Cardiovascular: S1 S2 clear, RRR  Respiratory: CTAB  Gastrointestinal: Soft, nontender, nondistended, + bowel sounds  Ext: no cyanosis clubbing or edema. Left hand with dressing intact  Neuro: no new deficits   Skin: No rashes  Psych: Normal affect and demeanor, alert and oriented x3    Data Reviewed:  I have personally reviewed following labs and imaging studies  Micro Results Recent Results (from the past 240 hour(s))  Blood culture (routine x 2)     Status: None (Preliminary result)   Collection Time: 07/15/16  8:16 AM  Result Value Ref Range Status   Specimen Description BLOOD  IV  Final   Special Requests IN PEDIATRIC BOTTLE  1CC  Final   Culture NO GROWTH 3 DAYS  Final   Report Status PENDING  Incomplete  Blood culture (routine x 2)     Status: Abnormal   Collection Time: 07/15/16  9:00 AM  Result Value Ref Range Status   Specimen Description BLOOD LEFT HAND  Final   Special Requests IN PEDIATRIC BOTTLE  4CC  Final   Culture  Setup Time   Final    GRAM POSITIVE RODS AEROBIC BOTTLE ONLY CRITICAL RESULT CALLED TO, READ BACK BY AND VERIFIED WITH: J. MILLEN, PHARMD AT 1625 ON 07/16/16 BY C. JESSUP, MLT.    Culture (A)  Final    BACILLUS SPECIES Standardized susceptibility testing for this organism is not available.    Report Status 07/18/2016 FINAL  Final  Blood Culture ID Panel (Reflexed)     Status: None   Collection Time: 07/15/16  9:00 AM  Result Value Ref Range Status   Enterococcus species NOT DETECTED NOT  DETECTED Final   Listeria monocytogenes NOT DETECTED NOT DETECTED Final   Staphylococcus species NOT DETECTED NOT DETECTED Final   Staphylococcus aureus NOT DETECTED NOT DETECTED Final   Streptococcus species NOT DETECTED NOT DETECTED Final   Streptococcus agalactiae NOT DETECTED NOT DETECTED Final   Streptococcus pneumoniae NOT DETECTED NOT DETECTED Final   Streptococcus pyogenes NOT DETECTED NOT DETECTED Final   Acinetobacter baumannii NOT DETECTED NOT DETECTED Final   Enterobacteriaceae species NOT DETECTED NOT DETECTED Final   Enterobacter cloacae complex NOT DETECTED NOT DETECTED Final   Escherichia coli NOT DETECTED NOT DETECTED Final   Klebsiella oxytoca NOT DETECTED NOT DETECTED Final   Klebsiella pneumoniae NOT DETECTED NOT DETECTED Final   Proteus species NOT DETECTED NOT DETECTED Final   Serratia marcescens NOT DETECTED NOT DETECTED Final   Haemophilus influenzae NOT DETECTED NOT DETECTED Final   Neisseria meningitidis NOT DETECTED NOT DETECTED Final   Pseudomonas aeruginosa NOT DETECTED NOT DETECTED Final   Candida albicans NOT DETECTED NOT DETECTED Final   Candida glabrata NOT DETECTED NOT DETECTED Final   Candida krusei NOT DETECTED NOT DETECTED Final   Candida parapsilosis NOT DETECTED NOT DETECTED Final   Candida tropicalis NOT DETECTED NOT DETECTED Final  Urine culture     Status: Abnormal   Collection Time: 07/15/16 10:20 AM  Result Value Ref Range Status   Specimen Description URINE, RANDOM  Final   Special Requests NONE  Final   Culture <10,000 COLONIES/mL INSIGNIFICANT GROWTH (A)  Final   Report Status 07/16/2016 FINAL  Final  Surgical pcr screen  Status: Abnormal   Collection Time: 07/15/16  6:27 PM  Result Value Ref Range Status   MRSA, PCR POSITIVE (A) NEGATIVE Final   Staphylococcus aureus POSITIVE (A) NEGATIVE Final    Comment:        The Xpert SA Assay (FDA approved for NASAL specimens in patients over 63 years of age), is one component of a  comprehensive surveillance program.  Test performance has been validated by [facility] for patients greater than or equal to 5 year old. It is not intended to diagnose infection nor to guide or monitor treatment. CRITICAL RESULT CALLED TO, READ BACK BY AND VERIFIED WITH: CBilly Coast, RN AT 2027 ON 07/15/16 BY C. JESSUP, MLT.   Aerobic/Anaerobic Culture (surgical/deep wound)     Status: None (Preliminary result)   Collection Time: 07/15/16  7:51 PM  Result Value Ref Range Status   Specimen Description ABSCESS LEFT HAND  Final   Special Requests PT ON FLAGYL DORSAL LEFT HAND SWAB  Final   Gram Stain   Final    FEW WBC PRESENT,BOTH PMN AND MONONUCLEAR FEW GRAM POSITIVE COCCI IN CLUSTERS    Culture   Final    MODERATE METHICILLIN RESISTANT STAPHYLOCOCCUS AUREUS NO ANAEROBES ISOLATED; CULTURE IN PROGRESS FOR 5 DAYS    Report Status PENDING  Incomplete   Organism ID, Bacteria METHICILLIN RESISTANT STAPHYLOCOCCUS AUREUS  Final      Susceptibility   Methicillin resistant staphylococcus aureus - MIC*    CIPROFLOXACIN <=0.5 SENSITIVE Sensitive     ERYTHROMYCIN >=8 RESISTANT Resistant     GENTAMICIN <=0.5 SENSITIVE Sensitive     OXACILLIN >=4 RESISTANT Resistant     TETRACYCLINE <=1 SENSITIVE Sensitive     VANCOMYCIN 1 SENSITIVE Sensitive     TRIMETH/SULFA <=10 SENSITIVE Sensitive     CLINDAMYCIN <=0.25 SENSITIVE Sensitive     RIFAMPIN <=0.5 SENSITIVE Sensitive     Inducible Clindamycin NEGATIVE Sensitive     * MODERATE METHICILLIN RESISTANT STAPHYLOCOCCUS AUREUS  Fungus Culture With Stain (Not @ [facility])     Status: None (Preliminary result)   Collection Time: 07/15/16  7:51 PM  Result Value Ref Range Status   Fungus Stain Final report  Final    Comment: (NOTE) Performed At: [facility] [address] [place], [place] [id number] Mila Homer MD [id number]    Fungus (Mycology) Culture PENDING  Incomplete   Fungal Source LEFT  Final    Comment: HAND DORSAL   Fungus  Culture Result     Status: None   Collection Time: 07/15/16  7:51 PM  Result Value Ref Range Status   Result 1 Comment  Final    Comment: (NOTE) KOH/Calcofluor preparation:  no fungus observed. Performed At: [facility] [address] [place], [place] [id number] Mila Homer MD BM:[id number]   Aerobic/Anaerobic Culture (surgical/deep wound)     Status: None (Preliminary result)   Collection Time: 07/15/16  7:54 PM  Result Value Ref Range Status   Specimen Description TISSUE LEFT HAND  Final   Special Requests DOSAL  PT ON FLAGYL  Final   Gram Stain   Final    MODERATE WBC PRESENT,BOTH PMN AND MONONUCLEAR RARE GRAM POSITIVE COCCI IN PAIRS    Culture   Final    MODERATE METHICILLIN RESISTANT STAPHYLOCOCCUS AUREUS NO ANAEROBES ISOLATED; CULTURE IN PROGRESS FOR 5 DAYS    Report Status PENDING  Incomplete   Organism ID, Bacteria METHICILLIN RESISTANT STAPHYLOCOCCUS AUREUS  Final      Susceptibility  Methicillin resistant staphylococcus aureus - MIC*    CIPROFLOXACIN <=0.5 SENSITIVE Sensitive     ERYTHROMYCIN >=8 RESISTANT Resistant     GENTAMICIN <=0.5 SENSITIVE Sensitive     OXACILLIN >=4 RESISTANT Resistant     TETRACYCLINE <=1 SENSITIVE Sensitive     VANCOMYCIN 1 SENSITIVE Sensitive     TRIMETH/SULFA <=10 SENSITIVE Sensitive     CLINDAMYCIN <=0.25 SENSITIVE Sensitive     RIFAMPIN <=0.5 SENSITIVE Sensitive     Inducible Clindamycin NEGATIVE Sensitive     * MODERATE METHICILLIN RESISTANT STAPHYLOCOCCUS AUREUS  Fungus Culture With Stain (Not @ [facility])     Status: None (Preliminary result)   Collection Time: 07/15/16  7:54 PM  Result Value Ref Range Status   Fungus Stain Final report  Final    Comment: (NOTE) Performed At: [facility] [address] [place], [place] [id number] Mila Homer MD [id number]    Fungus (Mycology) Culture PENDING  Incomplete   Fungal Source TISSUE  Final    Comment: LEFT HAND DORSAL   Fungus Culture Result      Status: None   Collection Time: 07/15/16  7:54 PM  Result Value Ref Range Status   Result 1 Comment  Final    Comment: (NOTE) KOH/Calcofluor preparation:  no fungus observed. Performed At: [facility] [address] [place], [place] [id number] Mila Homer MD QI:[id number]     Radiology Reports Dg Chest 2 View  Result Date: 07/15/2016 CLINICAL DATA:  Rhonchi on chest auscultation.  Smoker. EXAM: CHEST  2 VIEW COMPARISON:  Single-view of the chest 05/31/2016. PA and lateral chest 12/15/2014. CT chest 11/15/2012. FINDINGS: The chest is hyperexpanded with peribronchial thickening. A small focus of airspace opacity is seen in the right upper lobe on the frontal pain and appears new since the most recent examination. Heart size is normal. No pneumothorax or pleural effusion. IMPRESSION: Findings compatible with COPD. Small focus of airspace opacity in the right upper lobe may be atelectasis or infection. Recommend repeat PA and lateral films in 3-4 weeks to ensure resolution. Electronically Signed   By: Drusilla Kanner M.D.   On: 07/15/2016 15:02   Dg Forearm Left  Result Date: 07/15/2016 CLINICAL DATA:  Left hand and 4 arm swelling. Complains of numbness and tingling. EXAM: LEFT FOREARM - 2 VIEW COMPARISON:  None. FINDINGS: Frontal and lateral views of the left forearm demonstrate diffuse subcutaneous soft tissue swelling of the left forearm. There is no acute displaced fracture. No significant elbow effusion. Radial head alignment normal. No radiopaque foreign body. No gas collections in the soft tissues. IMPRESSION: 1. Diffuse soft tissue swelling of the left forearm 2. No radiographic evidence for acute osseous abnormality Electronically Signed   By: Jasmine Pang M.D.   On: 07/15/2016 11:58   Mr Hand Left W Wo Contrast  Result Date: 07/15/2016 CLINICAL DATA:  Redness and swelling of the hand. Presents with left hand and distal left arm swelling over the past several hours. EXAM:  MR OF THE LEFT WRIST WITHOUT AND WITH CONTRAST TECHNIQUE: Multiplanar multisequence MR imaging of the left wrist was performed both before and after the administration of intravenous contrast. CONTRAST:  10mL MULTIHANCE GADOBENATE DIMEGLUMINE 529 MG/ML IV SOLN COMPARISON:  None. FINDINGS: Ligaments:  Intact scapholunate and lunotriquetral ligaments. Triangular fibrocartilage: Central perforation of the TFCC. Tendons: Intact flexor compartment tendons. Tenosynovitis of the extensor digitorum, extensor carpi radialis brevis and extensor carpi radialis longus. Carpal tunnel/median nerve: Normal carpal tunnel. Normal median nerve. Guyon's canal:  Normal. Joint/cartilage: No joint effusion. No chondral defect. Bones/carpal alignment: No marrow signal abnormality. Normal alignment. No aggressive osseous lesion. Other: Severe soft tissue swelling enhancement of the dorsal aspect of the to lesser extent volar aspect of the hand along the thenar are eminence. Complex fluid collection infiltrating the dorsal soft tissues with the largest fluid collection measuring 2.2 x 81.4 x 0.8 cm at the level of the third-fourth mid metacarpal with a focal area extending deep to the extensor digitorum of the fourth digit at the level of the MCP joint. The fluid collection is located both superficial and deep to the extensor digitorum tendons of the second, third, fourth and fifth digits. IMPRESSION: 1. Severe cellulitis of the dorsal aspect of the left hand with a complex infiltrative abscess along the dorsal aspect of the hand which is located both superficial and deep to the extensor digitorum tendons. Largest abscess measures 2.2 x 81.4 x 0.8 cm at the level of the third-fourth mid metacarpal with a focal area extending deep to the extensor digitorum of the fourth digit at the level of the MCP joint. 2. Tenosynovitis of the extensor digitorum, extensor carpi radialis brevis and extensor carpi radialis longus. These results were called  by telephone at the time of interpretation on 07/15/2016 at 6:18 pm to Dr. Amanda Pea, who verbally acknowledged these results. Electronically Signed   By: Elige Ko   On: 07/15/2016 18:19   Dg Hand Complete Left  Result Date: 07/15/2016 CLINICAL DATA:  Left hand swelling that started a few days ago after injected hand with cocaine. For arm numbness and tingling. EXAM: LEFT HAND - COMPLETE 3+ VIEW COMPARISON:  None. FINDINGS: PA oblique and lateral views left hand. No acute displaced fracture or malalignment. Diffuse soft tissue swelling over the dorsum of the hand. No radiopaque foreign body or soft tissue gas collections. IMPRESSION: Marked swelling over the dorsum of the left hand. No acute underlying osseous abnormality. Electronically Signed   By: Jasmine Pang M.D.   On: 07/15/2016 11:57    Lab Data:  CBC:  Recent Labs Lab 07/15/16 0816 07/16/16 0315 07/17/16 0910 07/18/16 0657 07/19/16 0857  WBC 12.3* 10.2 6.4 6.4 5.9  NEUTROABS 8.9*  --   --   --   --   HGB 14.2 11.8* 11.5* 11.6* 11.1*  HCT 42.4 36.7 36.4 35.5* 34.2*  MCV 95.3 97.1 97.6 96.2 96.6  PLT 216 191 164 162 150   Basic Metabolic Panel:  Recent Labs Lab 07/15/16 0816 07/16/16 0315 07/17/16 0910 07/18/16 0657 07/19/16 0857  NA 135 138 140 141 137  K 3.9 3.8 4.5 4.3 3.7  CL 102 105 110 111 106  CO2 22 27 22 26 24   GLUCOSE 98 102* 127* 87 119*  BUN 8 8 8 9 11   CREATININE 0.69 0.85 0.79 0.72 0.65  CALCIUM 9.2 8.7* 8.5* 8.7* 8.5*   GFR: Estimated Creatinine Clearance: 82.8 mL/min (by C-G formula based on SCr of 0.65 mg/dL). Liver Function Tests:  Recent Labs Lab 07/15/16 0816  AST 22  ALT 18  ALKPHOS 123  BILITOT 0.6  PROT 7.8  ALBUMIN 4.1   No results for input(s): LIPASE, AMYLASE in the last 168 hours. No results for input(s): AMMONIA in the last 168 hours. Coagulation Profile: No results for input(s): INR, PROTIME in the last 168 hours. Cardiac Enzymes: No results for input(s): CKTOTAL, CKMB,  CKMBINDEX, TROPONINI in the last 168 hours. BNP (last 3 results) No results for input(s): PROBNP in the  last 8760 hours. HbA1C: No results for input(s): HGBA1C in the last 72 hours. CBG: No results for input(s): GLUCAP in the last 168 hours. Lipid Profile: No results for input(s): CHOL, HDL, LDLCALC, TRIG, CHOLHDL, LDLDIRECT in the last 72 hours. Thyroid Function Tests: No results for input(s): TSH, T4TOTAL, FREET4, T3FREE, THYROIDAB in the last 72 hours. Anemia Panel: No results for input(s): VITAMINB12, FOLATE, FERRITIN, TIBC, IRON, RETICCTPCT in the last 72 hours. Urine analysis:    Component Value Date/Time   COLORURINE COLORLESS (A) 07/15/2016 1020   APPEARANCEUR CLEAR 07/15/2016 1020   LABSPEC <1.005 (L) 07/15/2016 1020   PHURINE 6.0 07/15/2016 1020   GLUCOSEU NEGATIVE 07/15/2016 1020   HGBUR TRACE (A) 07/15/2016 1020   BILIRUBINUR NEGATIVE 07/15/2016 1020   KETONESUR NEGATIVE 07/15/2016 1020   PROTEINUR NEGATIVE 07/15/2016 1020   UROBILINOGEN 0.2 03/22/2010 1131   NITRITE NEGATIVE 07/15/2016 1020   LEUKOCYTESUR LARGE (A) 07/15/2016 1020     Wendy Douglas M.D. [facility] 07/19/2016, 11:06 AM  Pager: [phone] Between 7am to 7pm - call Pager - [phone]  After 7pm go to [email or web address] - password [email or web address]  Call night coverage person covering after 7pm

## 2016-07-19 NOTE — Progress Notes (Signed)
Pharmacy Antibiotic Note  Wendy Douglas is a 40 y.o. female admitted on 07/15/2016 with cellulitis.  Patient was given vancomycin on 07/15/16 and then transitioned to clindamycin.  Left hand abscess culture is growing MRSA and patient to resume vancomycin.  ID consulted.   Patient continues on Flagyl for vaginal infection.  Vanc trougt came back at 5 this PM. We'll rebolus her and increase the dose.    Plan: - Vanc 1gm IV x 1, then increase vanc 750mg  q8 - Monitor renal fxn, clinical progress, vanc trough at Css - F/U ID consult   Height: 5\' 5"  (165.1 cm) Weight: 125 lb (56.7 kg) IBW/kg (Calculated) : 57  Temp (24hrs), Avg:98.3 F (36.8 C), Min:98.1 F (36.7 C), Max:98.4 F (36.9 C)   Recent Labs Lab 07/15/16 0816 07/15/16 0828 07/15/16 1209 07/16/16 0315 07/17/16 0910 07/18/16 0657 07/19/16 0857 07/19/16 1422  WBC 12.3*  --   --  10.2 6.4 6.4 5.9  --   CREATININE 0.69  --   --  0.85 0.79 0.72 0.65  --   LATICACIDVEN  --  1.46 0.98  --   --   --   --   --   VANCOTROUGH  --   --   --   --   --   --   --  5*    Estimated Creatinine Clearance: 82.8 mL/min (by C-G formula based on SCr of 0.65 mg/dL).    Allergies  Allergen Reactions  . Bee Venom Shortness Of Breath and Swelling  . Sulfa Antibiotics Rash     Antimicrobials this admission:  Vanc 9/12 >> 9/12, resumed 9/14 >> Clinda 9/12 >> 9/14 Flagyl 9/12 >>9/14  Dose adjustments this admission:  9/16 Vanc trough = 5>> vanc 1g IV x1 then increase to 750mg  q8  Microbiology results:  9/12 HIV - negative 9/12 MRSA / Staph - positive 9/12 left hand tissue - GPC on Gram stain, reincubated 9/12 UCx - negative 9/12 BCx x2 - GPR 1 of 2 (BCID negative) 9/12 left hand abscess - MRSA  Ulyses SouthwardMinh Sylina Henion, PharmD Pager: [phone] 07/19/2016 4:18 PM

## 2016-07-19 NOTE — Progress Notes (Signed)
Physical Therapy Wound Treatment Patient Details  Name: Wendy Douglas MRN: [id number] Date of Birth: 1976-08-05  Today's Date: 07/19/2016 Time: 1023-1046 Time Calculation (min): 23 min  Subjective  Subjective: "I'm doing better" Patient and Family Stated Goals: get better Date of Onset: 07/12/16 Prior Treatments: s/p surgical I&D  Pain Score: Pain Score: 8  Wound Assessment  Wound / Incision (Open or Dehisced) 07/16/16 Incision - Open Hand Left;Posterior;Proximal s/p I&D (Active)  Dressing Type Compression wrap;Gauze (Comment);Moist to dry 07/19/2016  1:26 PM  Dressing Changed Changed 07/19/2016  1:26 PM  Dressing Status Clean;Dry;Intact 07/19/2016  1:26 PM  Dressing Change Frequency Daily 07/19/2016  1:26 PM  Site / Wound Assessment Red;Bleeding 07/19/2016  1:26 PM  % Wound base Red or Granulating 100% 07/19/2016  1:26 PM  % Wound base Yellow 0% 07/19/2016  1:26 PM  % Wound base Black 0% 07/19/2016  1:26 PM  % Wound base Other (Comment) 0% 07/19/2016  1:26 PM  Peri-wound Assessment Erythema (blanchable);Edema 07/19/2016  1:26 PM  Margins Unattached edges (unapproximated) 07/19/2016  1:26 PM  Closure None 07/19/2016  1:26 PM  Drainage Amount Moderate 07/19/2016  1:26 PM  Drainage Description Serosanguineous 07/19/2016  1:26 PM  Treatment Hydrotherapy (Pulse lavage);Packing (Saline gauze) 07/19/2016  1:26 PM     Wound / Incision (Open or Dehisced) 07/16/16 Incision - Open Hand Left;Posterior;Mid s/p I&D (Active)  Dressing Type Compression wrap;Gauze (Comment);Moist to dry 07/19/2016  1:26 PM  Dressing Changed Changed 07/19/2016  1:26 PM  Dressing Status Clean;Dry;Intact 07/19/2016  1:26 PM  Dressing Change Frequency Daily 07/19/2016  1:26 PM  Site / Wound Assessment Red;Bleeding 07/19/2016  1:26 PM  % Wound base Red or Granulating 100% 07/19/2016  1:26 PM  % Wound base Yellow 0% 07/19/2016  1:26 PM  % Wound base Black 0% 07/19/2016  1:26 PM  % Wound base Other (Comment) 0% 07/19/2016  1:26 PM   Peri-wound Assessment Edema;Erythema (blanchable) 07/19/2016  1:26 PM  Margins Unattached edges (unapproximated) 07/19/2016  1:26 PM  Closure None 07/19/2016  1:26 PM  Drainage Amount Moderate 07/19/2016  1:26 PM  Drainage Description Serosanguineous 07/19/2016  1:26 PM  Treatment Hydrotherapy (Pulse lavage);Packing (Saline gauze) 07/19/2016  1:26 PM     Wound / Incision (Open or Dehisced) 07/16/16 Incision - Open Hand Left;Posterior;Distal s/p I&D (Active)  Dressing Type Compression wrap;Gauze (Comment);Moist to dry 07/19/2016  1:26 PM  Dressing Changed Changed 07/19/2016  1:26 PM  Dressing Status Clean;Dry;Intact 07/19/2016  1:26 PM  Dressing Change Frequency Daily 07/19/2016  1:26 PM  Site / Wound Assessment Red;Bleeding 07/19/2016  1:26 PM  % Wound base Red or Granulating 100% 07/19/2016  1:26 PM  % Wound base Yellow 0% 07/19/2016  1:26 PM  % Wound base Black 0% 07/19/2016  1:26 PM  % Wound base Other (Comment) 0% 07/19/2016  1:26 PM  Peri-wound Assessment Edema;Erythema (blanchable) 07/19/2016  1:26 PM  Margins Unattached edges (unapproximated) 07/19/2016  1:26 PM  Closure Surface sutures 07/19/2016  1:26 PM  Drainage Amount Moderate 07/19/2016  1:26 PM  Drainage Description Serosanguineous 07/19/2016  1:26 PM  Treatment Hydrotherapy (Pulse lavage);Packing (Saline gauze) 07/19/2016  1:26 PM   Hydrotherapy Pulsed lavage therapy - wound location: dorsum of lt hand Pulsed Lavage with Suction (psi): 4 psi Pulsed Lavage with Suction - Normal Saline Used: 1000 mL Pulsed Lavage Tip: Tip with splash shield   Wound Assessment and Plan  Wound Therapy - Assess/Plan/Recommendations Wound Therapy - Clinical Statement: Wound continues to look good. Pt  asking appropriated questions and verbalizes understanding of dressing changes. Wound Therapy - Functional Problem List: Decr use of left hand Factors Delaying/Impairing Wound Healing: Infection - systemic/local;Substance abuse;Tobacco use Hydrotherapy Plan:  Dressing change;Patient/family education;Pulsatile lavage with suction Wound Therapy - Frequency: 6X / week Wound Therapy - Follow Up Recommendations: Other (comment) (Per MD. Likely self dressing change.) Wound Plan: See above  Wound Therapy Goals- Improve the function of patient's integumentary system by progressing the wound(s) through the phases of wound healing (inflammation - proliferation - remodeling) by: Decrease Length/Width/Depth by (cm): depth by .1 Decrease Length/Width/Depth - Progress: Progressing toward goal Improve Drainage Characteristics: Min Improve Drainage Characteristics - Progress: Progressing toward goal  Goals will be updated until maximal potential achieved or discharge criteria met.  Discharge criteria: when goals achieved, discharge from hospital, MD decision/surgical intervention, no progress towards goals, refusal/missing three consecutive treatments without notification or medical reason.  GP     Despina Pole 07/19/2016, 2:00 PM Carita Pian. Sanjuana Kava, [place] Pager [phone]

## 2016-07-20 LAB — AEROBIC/ANAEROBIC CULTURE (SURGICAL/DEEP WOUND)

## 2016-07-20 LAB — CULTURE, BLOOD (ROUTINE X 2): CULTURE: NO GROWTH

## 2016-07-20 LAB — AEROBIC/ANAEROBIC CULTURE W GRAM STAIN (SURGICAL/DEEP WOUND)

## 2016-07-20 MED ORDER — OXYCODONE HCL 5 MG PO TABS: 5.0000 mg | ORAL_TABLET | ORAL | 0 refills | Status: DC | PRN

## 2016-07-20 MED ORDER — ADULT MULTIVITAMIN W/MINERALS CH
1.0000 | ORAL_TABLET | ORAL | Status: DC
  Administered 2016-07-21 – 2016-07-22 (×2): 1 via ORAL
  Filled 2016-07-20 (×2): qty 1

## 2016-07-20 MED ORDER — DOXYCYCLINE HYCLATE 100 MG PO TABS
100.0000 mg | ORAL_TABLET | Freq: Two times a day (BID) | ORAL | Status: DC
  Administered 2016-07-20 – 2016-07-21 (×2): 100 mg via ORAL
  Filled 2016-07-20 (×2): qty 1

## 2016-07-20 MED ORDER — MORPHINE SULFATE (PF) 2 MG/ML IV SOLN
1.0000 mg | INTRAVENOUS | Status: DC | PRN
Start: 2016-07-20 — End: 2016-07-20
  Administered 2016-07-20: 1 mg via INTRAVENOUS
  Filled 2016-07-20: qty 1

## 2016-07-20 MED ORDER — MORPHINE SULFATE (PF) 2 MG/ML IV SOLN
2.0000 mg | INTRAVENOUS | Status: DC | PRN
Start: 2016-07-20 — End: 2016-07-22
  Administered 2016-07-20 – 2016-07-22 (×10): 2 mg via INTRAVENOUS
  Filled 2016-07-20 (×12): qty 1

## 2016-07-20 NOTE — Progress Notes (Signed)
Subjective: 5 Days Post-Op Procedure(s) (LRB): IRRIGATION AND DEBRIDEMENT LEFT HAND WITH TENOSYNOVECTOMY (Left) Patient reports pain as of this morning. She does not have any complaints. She denies nausea, fever, chills, shortness of breath. She is tolerating a regular diet voiding and has had a bowel movement.   Objective: Vital signs in last 24 hours: Temp:  [98.4 F (36.9 C)-98.7 F (37.1 C)] 98.6 F (37 C) (09/17 0550) Pulse Rate:  [72-77] 77 (09/17 0550) Resp:  [17-19] 19 (09/17 0550) BP: (148-152)/(81-93) 148/81 (09/17 0550) SpO2:  [100 %] 100 % (09/17 0550)  Intake/Output from previous day: 09/16 0701 - 09/17 0700 In: 1914 [P.O.:1320; IV Piggyback:150] Out: -  Intake/Output this shift: No intake/output data recorded.   Recent Labs  07/18/16 0657 07/19/16 0857  HGB 11.6* 11.1*    Recent Labs  07/18/16 0657 07/19/16 0857  WBC 6.4 5.9  RBC 3.69* 3.54*  HCT 35.5* 34.2*  PLT 162 150    Recent Labs  07/18/16 0657 07/19/16 0857  NA 141 137  K 4.3 3.7  CL 111 106  CO2 26 24  BUN 9 11  CREATININE 0.72 0.65  GLUCOSE 87 119*  CALCIUM 8.7* 8.5*   Results for orders placed or performed during the hospital encounter of 07/15/16  Blood culture (routine x 2)     Status: None (Preliminary result)   Collection Time: 07/15/16  8:16 AM  Result Value Ref Range Status   Specimen Description BLOOD  IV  Final   Special Requests IN PEDIATRIC BOTTLE  1CC  Final   Culture NO GROWTH 4 DAYS  Final   Report Status PENDING  Incomplete  Blood culture (routine x 2)     Status: Abnormal   Collection Time: 07/15/16  9:00 AM  Result Value Ref Range Status   Specimen Description BLOOD LEFT HAND  Final   Special Requests IN PEDIATRIC BOTTLE  4CC  Final   Culture  Setup Time   Final    GRAM POSITIVE RODS AEROBIC BOTTLE ONLY CRITICAL RESULT CALLED TO, READ BACK BY AND VERIFIED WITH: J. MILLEN, PHARMD AT 1625 ON 07/16/16 BY C. JESSUP, MLT.    Culture (A)  Final    BACILLUS  SPECIES Standardized susceptibility testing for this organism is not available.    Report Status 07/18/2016 FINAL  Final  Blood Culture ID Panel (Reflexed)     Status: None   Collection Time: 07/15/16  9:00 AM  Result Value Ref Range Status   Enterococcus species NOT DETECTED NOT DETECTED Final   Listeria monocytogenes NOT DETECTED NOT DETECTED Final   Staphylococcus species NOT DETECTED NOT DETECTED Final   Staphylococcus aureus NOT DETECTED NOT DETECTED Final   Streptococcus species NOT DETECTED NOT DETECTED Final   Streptococcus agalactiae NOT DETECTED NOT DETECTED Final   Streptococcus pneumoniae NOT DETECTED NOT DETECTED Final   Streptococcus pyogenes NOT DETECTED NOT DETECTED Final   Acinetobacter baumannii NOT DETECTED NOT DETECTED Final   Enterobacteriaceae species NOT DETECTED NOT DETECTED Final   Enterobacter cloacae complex NOT DETECTED NOT DETECTED Final   Escherichia coli NOT DETECTED NOT DETECTED Final   Klebsiella oxytoca NOT DETECTED NOT DETECTED Final   Klebsiella pneumoniae NOT DETECTED NOT DETECTED Final   Proteus species NOT DETECTED NOT DETECTED Final   Serratia marcescens NOT DETECTED NOT DETECTED Final   Haemophilus influenzae NOT DETECTED NOT DETECTED Final   Neisseria meningitidis NOT DETECTED NOT DETECTED Final   Pseudomonas aeruginosa NOT DETECTED NOT DETECTED Final   Candida albicans  NOT DETECTED NOT DETECTED Final   Candida glabrata NOT DETECTED NOT DETECTED Final   Candida krusei NOT DETECTED NOT DETECTED Final   Candida parapsilosis NOT DETECTED NOT DETECTED Final   Candida tropicalis NOT DETECTED NOT DETECTED Final  Urine culture     Status: Abnormal   Collection Time: 07/15/16 10:20 AM  Result Value Ref Range Status   Specimen Description URINE, RANDOM  Final   Special Requests NONE  Final   Culture <10,000 COLONIES/mL INSIGNIFICANT GROWTH (A)  Final   Report Status 07/16/2016 FINAL  Final  Surgical pcr screen     Status: Abnormal   Collection  Time: 07/15/16  6:27 PM  Result Value Ref Range Status   MRSA, PCR POSITIVE (A) NEGATIVE Final   Staphylococcus aureus POSITIVE (A) NEGATIVE Final    Comment:        The Xpert SA Assay (FDA approved for NASAL specimens in patients over 74 years of age), is one component of a comprehensive surveillance program.  Test performance has been validated by [facility] for patients greater than or equal to 89 year old. It is not intended to diagnose infection nor to guide or monitor treatment. CRITICAL RESULT CALLED TO, READ BACK BY AND VERIFIED WITH: CGeanie Logan, RN AT 2027 ON 07/15/16 BY C. JESSUP, MLT.   Aerobic/Anaerobic Culture (surgical/deep wound)     Status: None (Preliminary result)   Collection Time: 07/15/16  7:51 PM  Result Value Ref Range Status   Specimen Description ABSCESS LEFT HAND  Final   Special Requests PT ON FLAGYL DORSAL LEFT HAND SWAB  Final   Gram Stain   Final    FEW WBC PRESENT,BOTH PMN AND MONONUCLEAR FEW GRAM POSITIVE COCCI IN CLUSTERS    Culture   Final    MODERATE METHICILLIN RESISTANT STAPHYLOCOCCUS AUREUS NO ANAEROBES ISOLATED; CULTURE IN PROGRESS FOR 5 DAYS    Report Status PENDING  Incomplete   Organism ID, Bacteria METHICILLIN RESISTANT STAPHYLOCOCCUS AUREUS  Final      Susceptibility   Methicillin resistant staphylococcus aureus - MIC*    CIPROFLOXACIN <=0.5 SENSITIVE Sensitive     ERYTHROMYCIN >=8 RESISTANT Resistant     GENTAMICIN <=0.5 SENSITIVE Sensitive     OXACILLIN >=4 RESISTANT Resistant     TETRACYCLINE <=1 SENSITIVE Sensitive     VANCOMYCIN 1 SENSITIVE Sensitive     TRIMETH/SULFA <=10 SENSITIVE Sensitive     CLINDAMYCIN <=0.25 SENSITIVE Sensitive     RIFAMPIN <=0.5 SENSITIVE Sensitive     Inducible Clindamycin NEGATIVE Sensitive     * MODERATE METHICILLIN RESISTANT STAPHYLOCOCCUS AUREUS  Fungus Culture With Stain (Not @ [facility])     Status: None (Preliminary result)   Collection Time: 07/15/16  7:51 PM  Result Value Ref Range Status    Fungus Stain Final report  Final    Comment: (NOTE) Performed At: [facility] [place], [place] [id number] Lindon Romp MD [id number]    Fungus (Mycology) Culture PENDING  Incomplete   Fungal Source LEFT  Final    Comment: HAND DORSAL   Fungus Culture Result     Status: None   Collection Time: 07/15/16  7:51 PM  Result Value Ref Range Status   Result 1 Comment  Final    Comment: (NOTE) KOH/Calcofluor preparation:  no fungus observed. Performed At: [facility] [place], [place] [id number] Lindon Romp MD ZE:[id number]   Aerobic/Anaerobic Culture (surgical/deep wound)     Status: None (Preliminary result)  Collection Time: 07/15/16  7:54 PM  Result Value Ref Range Status   Specimen Description TISSUE LEFT HAND  Final   Special Requests DOSAL  PT ON FLAGYL  Final   Gram Stain   Final    MODERATE WBC PRESENT,BOTH PMN AND MONONUCLEAR RARE GRAM POSITIVE COCCI IN PAIRS    Culture   Final    MODERATE METHICILLIN RESISTANT STAPHYLOCOCCUS AUREUS NO ANAEROBES ISOLATED; CULTURE IN PROGRESS FOR 5 DAYS    Report Status PENDING  Incomplete   Organism ID, Bacteria METHICILLIN RESISTANT STAPHYLOCOCCUS AUREUS  Final      Susceptibility   Methicillin resistant staphylococcus aureus - MIC*    CIPROFLOXACIN <=0.5 SENSITIVE Sensitive     ERYTHROMYCIN >=8 RESISTANT Resistant     GENTAMICIN <=0.5 SENSITIVE Sensitive     OXACILLIN >=4 RESISTANT Resistant     TETRACYCLINE <=1 SENSITIVE Sensitive     VANCOMYCIN 1 SENSITIVE Sensitive     TRIMETH/SULFA <=10 SENSITIVE Sensitive     CLINDAMYCIN <=0.25 SENSITIVE Sensitive     RIFAMPIN <=0.5 SENSITIVE Sensitive     Inducible Clindamycin NEGATIVE Sensitive     * MODERATE METHICILLIN RESISTANT STAPHYLOCOCCUS AUREUS  Fungus Culture With Stain (Not @ [facility])     Status: None (Preliminary result)   Collection Time: 07/15/16  7:54 PM  Result Value Ref Range Status   Fungus Stain Final report  Final     Comment: (NOTE) Performed At: [facility] [place], [place] [id number] Lindon Romp MD RA:[id number]    Fungus (Mycology) Culture PENDING  Incomplete   Fungal Source TISSUE  Final    Comment: LEFT HAND DORSAL   Fungus Culture Result     Status: None   Collection Time: 07/15/16  7:54 PM  Result Value Ref Range Status   Result 1 Comment  Final    Comment: (NOTE) KOH/Calcofluor preparation:  no fungus observed. Performed At: [facility] [place], [place] [id number] Lindon Romp MD [id number]    No results for input(s): LABPT, INR in the last 72 hours.  Physical examination: The patient is alert and oriented in no acute distress. The patient complains of minimal pain in the affected upper extremity.  The patient is noted to have a normal HEENT exam. Lung fields show equal chest expansion and no shortness of breath. Abdomen exam is nontender without distention. Lower extremity examination does not show any fracture dislocation or blood clot symptoms. Pelvis is stable and the neck and back are stable and nontender. Evaluation of the left hand: Dressings are removed, packing is removed. She has had marked improvement of her erythema and swelling. She is moving the fingers better, she has had significant progress in terms of granulation tissue bout the depths of the wound. Currently there is no exposed tendon in the depths of the wound. There is no purulent discharge there is no signs of ascending cellulitis or recurrent abscess present. She currently has no skin breakdown about the dorsal aspect of the hand.   Assessment/Plan: 5 Days Post-Op Procedure(s) (LRB): IRRIGATION AND DEBRIDEMENT LEFT HAND WITH TENOSYNOVECTOMY (Left) I have performed a bedside repeat irrigation and excisional debridement of the wounds 4 about the dorsal aspect of the hand utilizing pickup and scissors necrotic tissue was excised about the periphery  of the wounds. He was performed to the degree healthy bleeding granulation tissue was noted once again the depths of the wound looked very well and there is no exposed extensor tendons present. Wounds  were lightly wicked with wet wicking's followed by dry dressings, the  was applied to the skin and the hand was rewrapped. The patient tolerated this very well and there was no complications present. I have had a lengthy discussion with the hospitalist this morning in regards to her tentative discharge today. I have discussed with them that it would be in the best interest of the patient at this juncture to continue her IV vancomycin to a therapeutic level as this was recently adjusted, in addition, infectious disease needs to weigh in for final suggestions for oral antibiotics, as apparently, the patient has a sulfa allergy and will not be able to tolerate Bactrim as originally recommended. We would recommend 2 weeks worth of oral antibiotics. This will most likely mean she will need Cipro or clindamycin  based on her sensitivities. Thus, we would also recommend that a social work consult be implemented for assistance with oral antibiotics as the patient is homeless/lives In a tent and does not have significant financial resources. In addition, the patient will need dressing supplies to include: 4 x 4's, 2 x 2's for the wet wicking, sterile water/ normal saline, Kerlex and Ace wraps. She will need to perform wet-to-dry dressing changes at a minimum of once a day. Overall her hand is markedly improved and we will need to follow her up closely in our office setting once she is discharged. Per medicine these goals will be met tomorrow and the patient will most likely he discharged afterwards. The patient will then need to follow up with [place] in our office Tuesday for a wound check.   Myan Suit L 07/20/2016, 11:16 AM

## 2016-07-20 NOTE — Progress Notes (Signed)
Pt. Asked for something for pain. When I took pt. PO pain medication, she said that was not what she wanted. I notified the MD that pt. Still wants IV morphine and per MD morphine dose was increased.

## 2016-07-20 NOTE — Progress Notes (Signed)
[facility]                                                                              Patient Demographics  Wendy Douglas, is a 40 y.o. female, DOB - [birth year], ZOX:[id number]  Admit date - 07/15/2016   Admitting Physician Ozella Rocks, MD  Outpatient Primary MD for the patient is No PCP Per Patient  Outpatient specialists:   LOS - 5  days    Chief Complaint  Patient presents with  . Arm Swelling  . Hand Injury       Brief summary   Wendy Douglas is a 40 y.o. female with medical history significant of COPD, polysubstance abuse/IVDU, presenting with left hand swelling pain in the redness. Started 1 day Prior to admission, the patient reported  attempted cocaine injection between the fourth and fifth fingers on the dorsum of the hand 3 days ago. States that this did not go into the vein directly. Symptoms are constant and getting worse. Denies fevers, chills, neck stiffness, headache, palpitations, chest pain, shortness of breath, nausea, vomiting, diarrhea, dysuria, frequency, vaginal discharge, back pain.    Assessment & Plan   Cellulitis and abscess of hand: Likely secondary to attempted cocaine injection hand.  - MRI showed severe cellulitis of the dorsal aspect of the left hand with complex infiltrative abscess located both superficial and deep to the extensor digitorum tendons, largest abscess 2.2 x 8 x 0.8 centimeter and infectious tenosynovitis. - Hand surgery was consulted, appreciate Dr. Carlos Levering assistance. Patient underwentirrigation and debridement of the deep abscess and radical tenosynovectomy on 9/12 -  wound Cultures growing MRSA,  Appreciate ID recommendations, IV antibiotics while inpatient, Bactrim when discharged however patient is allergic to sulfa antibiotics, will discuss with ID regarding antibiotic recommendations - Patient is able to do her dressing changes, wound care following for hydrotherapy, it appears that hand surgery has  signed off and stated that patient can be discharged anytime when medically ready. When patient was told about the potential discharge today, her partner and she became upset and did not think she is ready, her partner was somewhat hostile. I called Dr. Thomasena Edis from [facility] orthoped[facility] who recommended keeping the patient overnight and have hand surgery see her in a.m. for follow-up instructions and then discharge. - Also appears patient has narcotic dependency with pain medication seeking behavior. Confirmed with RN, patient has been very comfortable but continues asking for pain medications. I have cut down her morphine substantially.  Trichomonas: present on UA (contaminated sample). Suspect vaginal infection.  - Completed metronidazole  COPD: With slight wheezing - continue albuterol   Polysubstance use: Patient's UDS positive for opiates, cocaine and THC. - Per patient she uses heroin 1 g daily.Currently not in withdrawals, patient appears to be stable on my examination - will continue IV morphine as needed.  - Discussed in detail with the patient, she is reported that she was in a methadone program for 7 years and 6 months ago she quit and started using heroin. She is no longer on methadone.   Nicotine abuse: - Patient smokes half pack per day. - Placed on nicotine patch  Depression/Anxiety: - continue vistaril, Abilify, Celexa   Code Status: Full code DVT Prophylaxis:  SCD's Family Communication: Discussed in detail with the patient, all imaging results, lab results explained to the patient    Disposition Plan: DC in a.m.  Time Spent in minutes 15 minutes  Procedures:  PROCEDURES: 1. Irrigation and debridement of deep abscess, skin, subcutaneous     tissue, tendon, muscle and bone/periosteum, underwent excisional     debridement with curette, knife and scissor.  This was an 8-cm in     length to various pie-crusted incisions and 2-3 cm in depth,     irrigation and  debridement. 2. Radical tenosynovectomy of the extensor apparatus involving the     second, fourth, fifth compartments, dorsal hand, wrist and forearm   Hydrotherapy  Consultants:   Hand surgery, Dr Amanda Pea ID  Antimicrobials:   IV vancomycin   Medications  Scheduled Meds: . feeding supplement (ENSURE ENLIVE)  237 mL Oral BID BM  . hydrOXYzine  25 mg Oral TID  . ipratropium-albuterol  3 mL Nebulization Once  . multivitamin with minerals  1 tablet Oral Daily  . nicotine  21 mg Transdermal Daily  . oxyCODONE  10 mg Oral Once  . vancomycin  750 mg Intravenous Q8H   Continuous Infusions:   PRN Meds:.acetaminophen **OR** acetaminophen, ipratropium-albuterol, ketorolac, morphine injection, ondansetron **OR** ondansetron (ZOFRAN) IV, oxyCODONE   Antibiotics   Anti-infectives    Start     Dose/Rate Route Frequency Ordered Stop   07/20/16 0100  vancomycin (VANCOCIN) IVPB 750 mg/150 ml premix     750 mg 150 mL/hr over 60 Minutes Intravenous Every 8 hours 07/19/16 1615     07/19/16 1630  vancomycin (VANCOCIN) IVPB 1000 mg/200 mL premix     1,000 mg 200 mL/hr over 60 Minutes Intravenous  Once 07/19/16 1615 07/19/16 1807   07/17/16 2300  vancomycin (VANCOCIN) 500 mg in sodium chloride 0.9 % 100 mL IVPB  Status:  Discontinued     500 mg 100 mL/hr over 60 Minutes Intravenous Every 8 hours 07/17/16 1328 07/19/16 1615   07/17/16 1330  vancomycin (VANCOCIN) IVPB 1000 mg/200 mL premix     1,000 mg 200 mL/hr over 60 Minutes Intravenous  Once 07/17/16 1328 07/17/16 1604   07/16/16 1000  metroNIDAZOLE (FLAGYL) tablet 500 mg  Status:  Discontinued     500 mg Oral Every 12 hours 07/15/16 1847 07/17/16 1530   07/15/16 2200  metroNIDAZOLE (FLAGYL) tablet 500 mg  Status:  Discontinued     500 mg Oral Every 12 hours 07/15/16 1718 07/15/16 1847   07/15/16 2130  vancomycin (VANCOCIN) IVPB 750 mg/150 ml premix  Status:  Discontinued     750 mg 150 mL/hr over 60 Minutes Intravenous Every 12  hours 07/15/16 0904 07/15/16 1841   07/15/16 1730  clindamycin (CLEOCIN) IVPB 600 mg  Status:  Discontinued     600 mg 100 mL/hr over 30 Minutes Intravenous Every 8 hours 07/15/16 1718 07/17/16 1301   07/15/16 1130  metroNIDAZOLE (FLAGYL) tablet 2,000 mg     2,000 mg Oral  Once 07/15/16 1128 07/15/16 1135   07/15/16 0930  vancomycin (VANCOCIN) IVPB 1000 mg/200 mL premix     1,000 mg 200 mL/hr over 60 Minutes Intravenous  Once 07/15/16 0904 07/15/16 1051        Subjective:   Tanekia Ryans was seen and examined today. Complaining that she is not ready for discharge. Very comfortable and does not appear to be in any  pain or any withdrawals. Partner and patient became upset upon mentioning discharge today.  Denies chest pain, shortness of breath, abdominal pain, N/V/D/C, new weakness, numbess, tingling. No acute events overnight.    Objective:   Vitals:   07/19/16 0523 07/19/16 1405 07/19/16 2046 07/20/16 0550  BP: 130/66 (!) 151/84 (!) 152/93 (!) 148/81  Pulse: 76 72 77 77  Resp: 16 18 17 19   Temp: 98.1 F (36.7 C) 98.4 F (36.9 C) 98.7 F (37.1 C) 98.6 F (37 C)  TempSrc: Oral Oral Oral Oral  SpO2: 100% 100% 100% 100%  Weight:      Height:        Intake/Output Summary (Last 24 hours) at 07/20/16 1043 Last data filed at 07/20/16 0552  Gross per 24 hour  Intake             1110 ml  Output                0 ml  Net             1110 ml     Wt Readings from Last 3 Encounters:  07/15/16 56.7 kg (125 lb)  05/31/16 54.4 kg (120 lb)  12/15/14 68 kg (150 lb)     Exam  General: Alert and oriented x 3, NAD  HEENT:    Neck:   Cardiovascular: S1 S2 clear, RRR  Respiratory: CTAB  Gastrointestinal: Soft, nontender, nondistended, + bowel sounds  Ext: no cyanosis clubbing or edema. Left hand with dressing intact  Neuro: no new deficits   Skin: No rashes  Psych: Normal affect and demeanor, alert and oriented x3    Data Reviewed:  I have personally reviewed  following labs and imaging studies  Micro Results Recent Results (from the past 240 hour(s))  Blood culture (routine x 2)     Status: None (Preliminary result)   Collection Time: 07/15/16  8:16 AM  Result Value Ref Range Status   Specimen Description BLOOD  IV  Final   Special Requests IN PEDIATRIC BOTTLE  1CC  Final   Culture NO GROWTH 4 DAYS  Final   Report Status PENDING  Incomplete  Blood culture (routine x 2)     Status: Abnormal   Collection Time: 07/15/16  9:00 AM  Result Value Ref Range Status   Specimen Description BLOOD LEFT HAND  Final   Special Requests IN PEDIATRIC BOTTLE  4CC  Final   Culture  Setup Time   Final    GRAM POSITIVE RODS AEROBIC BOTTLE ONLY CRITICAL RESULT CALLED TO, READ BACK BY AND VERIFIED WITH: J. MILLEN, PHARMD AT 1625 ON 07/16/16 BY C. JESSUP, MLT.    Culture (A)  Final    BACILLUS SPECIES Standardized susceptibility testing for this organism is not available.    Report Status 07/18/2016 FINAL  Final  Blood Culture ID Panel (Reflexed)     Status: None   Collection Time: 07/15/16  9:00 AM  Result Value Ref Range Status   Enterococcus species NOT DETECTED NOT DETECTED Final   Listeria monocytogenes NOT DETECTED NOT DETECTED Final   Staphylococcus species NOT DETECTED NOT DETECTED Final   Staphylococcus aureus NOT DETECTED NOT DETECTED Final   Streptococcus species NOT DETECTED NOT DETECTED Final   Streptococcus agalactiae NOT DETECTED NOT DETECTED Final   Streptococcus pneumoniae NOT DETECTED NOT DETECTED Final   Streptococcus pyogenes NOT DETECTED NOT DETECTED Final   Acinetobacter baumannii NOT DETECTED NOT DETECTED Final   Enterobacteriaceae species NOT DETECTED NOT  DETECTED Final   Enterobacter cloacae complex NOT DETECTED NOT DETECTED Final   Escherichia coli NOT DETECTED NOT DETECTED Final   Klebsiella oxytoca NOT DETECTED NOT DETECTED Final   Klebsiella pneumoniae NOT DETECTED NOT DETECTED Final   Proteus species NOT DETECTED NOT  DETECTED Final   Serratia marcescens NOT DETECTED NOT DETECTED Final   Haemophilus influenzae NOT DETECTED NOT DETECTED Final   Neisseria meningitidis NOT DETECTED NOT DETECTED Final   Pseudomonas aeruginosa NOT DETECTED NOT DETECTED Final   Candida albicans NOT DETECTED NOT DETECTED Final   Candida glabrata NOT DETECTED NOT DETECTED Final   Candida krusei NOT DETECTED NOT DETECTED Final   Candida parapsilosis NOT DETECTED NOT DETECTED Final   Candida tropicalis NOT DETECTED NOT DETECTED Final  Urine culture     Status: Abnormal   Collection Time: 07/15/16 10:20 AM  Result Value Ref Range Status   Specimen Description URINE, RANDOM  Final   Special Requests NONE  Final   Culture <10,000 COLONIES/mL INSIGNIFICANT GROWTH (A)  Final   Report Status 07/16/2016 FINAL  Final  Surgical pcr screen     Status: Abnormal   Collection Time: 07/15/16  6:27 PM  Result Value Ref Range Status   MRSA, PCR POSITIVE (A) NEGATIVE Final   Staphylococcus aureus POSITIVE (A) NEGATIVE Final    Comment:        The Xpert SA Assay (FDA approved for NASAL specimens in patients over 61 years of age), is one component of a comprehensive surveillance program.  Test performance has been validated by [facility] for patients greater than or equal to 44 year old. It is not intended to diagnose infection nor to guide or monitor treatment. CRITICAL RESULT CALLED TO, READ BACK BY AND VERIFIED WITH: CBilly Coast, RN AT 2027 ON 07/15/16 BY C. JESSUP, MLT.   Aerobic/Anaerobic Culture (surgical/deep wound)     Status: None (Preliminary result)   Collection Time: 07/15/16  7:51 PM  Result Value Ref Range Status   Specimen Description ABSCESS LEFT HAND  Final   Special Requests PT ON FLAGYL DORSAL LEFT HAND SWAB  Final   Gram Stain   Final    FEW WBC PRESENT,BOTH PMN AND MONONUCLEAR FEW GRAM POSITIVE COCCI IN CLUSTERS    Culture   Final    MODERATE METHICILLIN RESISTANT STAPHYLOCOCCUS AUREUS NO ANAEROBES ISOLATED;  CULTURE IN PROGRESS FOR 5 DAYS    Report Status PENDING  Incomplete   Organism ID, Bacteria METHICILLIN RESISTANT STAPHYLOCOCCUS AUREUS  Final      Susceptibility   Methicillin resistant staphylococcus aureus - MIC*    CIPROFLOXACIN <=0.5 SENSITIVE Sensitive     ERYTHROMYCIN >=8 RESISTANT Resistant     GENTAMICIN <=0.5 SENSITIVE Sensitive     OXACILLIN >=4 RESISTANT Resistant     TETRACYCLINE <=1 SENSITIVE Sensitive     VANCOMYCIN 1 SENSITIVE Sensitive     TRIMETH/SULFA <=10 SENSITIVE Sensitive     CLINDAMYCIN <=0.25 SENSITIVE Sensitive     RIFAMPIN <=0.5 SENSITIVE Sensitive     Inducible Clindamycin NEGATIVE Sensitive     * MODERATE METHICILLIN RESISTANT STAPHYLOCOCCUS AUREUS  Fungus Culture With Stain (Not @ [facility])     Status: None (Preliminary result)   Collection Time: 07/15/16  7:51 PM  Result Value Ref Range Status   Fungus Stain Final report  Final    Comment: (NOTE) Performed At: [facility] [address] [place], [place] [id number] Mila Homer MD WU:[id number]    Fungus (Mycology) Culture PENDING  Incomplete  Fungal Source LEFT  Final    Comment: HAND DORSAL   Fungus Culture Result     Status: None   Collection Time: 07/15/16  7:51 PM  Result Value Ref Range Status   Result 1 Comment  Final    Comment: (NOTE) KOH/Calcofluor preparation:  no fungus observed. Performed At: [facility] [address] [place], [place] [id number] Mila Homer MD FA:[id number]   Aerobic/Anaerobic Culture (surgical/deep wound)     Status: None (Preliminary result)   Collection Time: 07/15/16  7:54 PM  Result Value Ref Range Status   Specimen Description TISSUE LEFT HAND  Final   Special Requests DOSAL  PT ON FLAGYL  Final   Gram Stain   Final    MODERATE WBC PRESENT,BOTH PMN AND MONONUCLEAR RARE GRAM POSITIVE COCCI IN PAIRS    Culture   Final    MODERATE METHICILLIN RESISTANT STAPHYLOCOCCUS AUREUS NO ANAEROBES ISOLATED; CULTURE IN PROGRESS FOR 5 DAYS     Report Status PENDING  Incomplete   Organism ID, Bacteria METHICILLIN RESISTANT STAPHYLOCOCCUS AUREUS  Final      Susceptibility   Methicillin resistant staphylococcus aureus - MIC*    CIPROFLOXACIN <=0.5 SENSITIVE Sensitive     ERYTHROMYCIN >=8 RESISTANT Resistant     GENTAMICIN <=0.5 SENSITIVE Sensitive     OXACILLIN >=4 RESISTANT Resistant     TETRACYCLINE <=1 SENSITIVE Sensitive     VANCOMYCIN 1 SENSITIVE Sensitive     TRIMETH/SULFA <=10 SENSITIVE Sensitive     CLINDAMYCIN <=0.25 SENSITIVE Sensitive     RIFAMPIN <=0.5 SENSITIVE Sensitive     Inducible Clindamycin NEGATIVE Sensitive     * MODERATE METHICILLIN RESISTANT STAPHYLOCOCCUS AUREUS  Fungus Culture With Stain (Not @ [facility])     Status: None (Preliminary result)   Collection Time: 07/15/16  7:54 PM  Result Value Ref Range Status   Fungus Stain Final report  Final    Comment: (NOTE) Performed At: [facility] [address] [place], [place] [id number] Mila Homer MD XL:[id number]    Fungus (Mycology) Culture PENDING  Incomplete   Fungal Source TISSUE  Final    Comment: LEFT HAND DORSAL   Fungus Culture Result     Status: None   Collection Time: 07/15/16  7:54 PM  Result Value Ref Range Status   Result 1 Comment  Final    Comment: (NOTE) KOH/Calcofluor preparation:  no fungus observed. Performed At: [facility] [address] [place], [place] [id number] Mila Homer MD QQ:[id number]     Radiology Reports Dg Chest 2 View  Result Date: 07/15/2016 CLINICAL DATA:  Rhonchi on chest auscultation.  Smoker. EXAM: CHEST  2 VIEW COMPARISON:  Single-view of the chest 05/31/2016. PA and lateral chest 12/15/2014. CT chest 11/15/2012. FINDINGS: The chest is hyperexpanded with peribronchial thickening. A small focus of airspace opacity is seen in the right upper lobe on the frontal pain and appears new since the most recent examination. Heart size is normal. No pneumothorax or pleural effusion.  IMPRESSION: Findings compatible with COPD. Small focus of airspace opacity in the right upper lobe may be atelectasis or infection. Recommend repeat PA and lateral films in 3-4 weeks to ensure resolution. Electronically Signed   By: Drusilla Kanner M.D.   On: 07/15/2016 15:02   Dg Forearm Left  Result Date: 07/15/2016 CLINICAL DATA:  Left hand and 4 arm swelling. Complains of numbness and tingling. EXAM: LEFT FOREARM - 2 VIEW COMPARISON:  None. FINDINGS: Frontal and lateral views of the left  forearm demonstrate diffuse subcutaneous soft tissue swelling of the left forearm. There is no acute displaced fracture. No significant elbow effusion. Radial head alignment normal. No radiopaque foreign body. No gas collections in the soft tissues. IMPRESSION: 1. Diffuse soft tissue swelling of the left forearm 2. No radiographic evidence for acute osseous abnormality Electronically Signed   By: Jasmine PangKim  Fujinaga M.D.   On: 07/15/2016 11:58   Mr Hand Left W Wo Contrast  Result Date: 07/15/2016 CLINICAL DATA:  Redness and swelling of the hand. Presents with left hand and distal left arm swelling over the past several hours. EXAM: MR OF THE LEFT WRIST WITHOUT AND WITH CONTRAST TECHNIQUE: Multiplanar multisequence MR imaging of the left wrist was performed both before and after the administration of intravenous contrast. CONTRAST:  10mL MULTIHANCE GADOBENATE DIMEGLUMINE 529 MG/ML IV SOLN COMPARISON:  None. FINDINGS: Ligaments:  Intact scapholunate and lunotriquetral ligaments. Triangular fibrocartilage: Central perforation of the TFCC. Tendons: Intact flexor compartment tendons. Tenosynovitis of the extensor digitorum, extensor carpi radialis brevis and extensor carpi radialis longus. Carpal tunnel/median nerve: Normal carpal tunnel. Normal median nerve. Guyon's canal: Normal. Joint/cartilage: No joint effusion. No chondral defect. Bones/carpal alignment: No marrow signal abnormality. Normal alignment. No aggressive osseous  lesion. Other: Severe soft tissue swelling enhancement of the dorsal aspect of the to lesser extent volar aspect of the hand along the thenar are eminence. Complex fluid collection infiltrating the dorsal soft tissues with the largest fluid collection measuring 2.2 x 81.4 x 0.8 cm at the level of the third-fourth mid metacarpal with a focal area extending deep to the extensor digitorum of the fourth digit at the level of the MCP joint. The fluid collection is located both superficial and deep to the extensor digitorum tendons of the second, third, fourth and fifth digits. IMPRESSION: 1. Severe cellulitis of the dorsal aspect of the left hand with a complex infiltrative abscess along the dorsal aspect of the hand which is located both superficial and deep to the extensor digitorum tendons. Largest abscess measures 2.2 x 81.4 x 0.8 cm at the level of the third-fourth mid metacarpal with a focal area extending deep to the extensor digitorum of the fourth digit at the level of the MCP joint. 2. Tenosynovitis of the extensor digitorum, extensor carpi radialis brevis and extensor carpi radialis longus. These results were called by telephone at the time of interpretation on 07/15/2016 at 6:18 pm to Dr. Amanda PeaGRAMIG, who verbally acknowledged these results. Electronically Signed   By: Elige KoHetal  Patel   On: 07/15/2016 18:19   Dg Hand Complete Left  Result Date: 07/15/2016 CLINICAL DATA:  Left hand swelling that started a few days ago after injected hand with cocaine. For arm numbness and tingling. EXAM: LEFT HAND - COMPLETE 3+ VIEW COMPARISON:  None. FINDINGS: PA oblique and lateral views left hand. No acute displaced fracture or malalignment. Diffuse soft tissue swelling over the dorsum of the hand. No radiopaque foreign body or soft tissue gas collections. IMPRESSION: Marked swelling over the dorsum of the left hand. No acute underlying osseous abnormality. Electronically Signed   By: Jasmine PangKim  Fujinaga M.D.   On: 07/15/2016 11:57     Lab Data:  CBC:  Recent Labs Lab 07/15/16 0816 07/16/16 0315 07/17/16 0910 07/18/16 0657 07/19/16 0857  WBC 12.3* 10.2 6.4 6.4 5.9  NEUTROABS 8.9*  --   --   --   --   HGB 14.2 11.8* 11.5* 11.6* 11.1*  HCT 42.4 36.7 36.4 35.5* 34.2*  MCV 95.3 97.1  97.6 96.2 96.6  PLT 216 191 164 162 150   Basic Metabolic Panel:  Recent Labs Lab 07/15/16 0816 07/16/16 0315 07/17/16 0910 07/18/16 0657 07/19/16 0857  NA 135 138 140 141 137  K 3.9 3.8 4.5 4.3 3.7  CL 102 105 110 111 106  CO2 22 27 22 26 24   GLUCOSE 98 102* 127* 87 119*  BUN 8 8 8 9 11   CREATININE 0.69 0.85 0.79 0.72 0.65  CALCIUM 9.2 8.7* 8.5* 8.7* 8.5*   GFR: Estimated Creatinine Clearance: 82.8 mL/min (by C-G formula based on SCr of 0.65 mg/dL). Liver Function Tests:  Recent Labs Lab 07/15/16 0816  AST 22  ALT 18  ALKPHOS 123  BILITOT 0.6  PROT 7.8  ALBUMIN 4.1   No results for input(s): LIPASE, AMYLASE in the last 168 hours. No results for input(s): AMMONIA in the last 168 hours. Coagulation Profile: No results for input(s): INR, PROTIME in the last 168 hours. Cardiac Enzymes: No results for input(s): CKTOTAL, CKMB, CKMBINDEX, TROPONINI in the last 168 hours. BNP (last 3 results) No results for input(s): PROBNP in the last 8760 hours. HbA1C: No results for input(s): HGBA1C in the last 72 hours. CBG: No results for input(s): GLUCAP in the last 168 hours. Lipid Profile: No results for input(s): CHOL, HDL, LDLCALC, TRIG, CHOLHDL, LDLDIRECT in the last 72 hours. Thyroid Function Tests: No results for input(s): TSH, T4TOTAL, FREET4, T3FREE, THYROIDAB in the last 72 hours. Anemia Panel: No results for input(s): VITAMINB12, FOLATE, FERRITIN, TIBC, IRON, RETICCTPCT in the last 72 hours. Urine analysis:    Component Value Date/Time   COLORURINE COLORLESS (A) 07/15/2016 1020   APPEARANCEUR CLEAR 07/15/2016 1020   LABSPEC <1.005 (L) 07/15/2016 1020   PHURINE 6.0 07/15/2016 1020   GLUCOSEU NEGATIVE  07/15/2016 1020   HGBUR TRACE (A) 07/15/2016 1020   BILIRUBINUR NEGATIVE 07/15/2016 1020   KETONESUR NEGATIVE 07/15/2016 1020   PROTEINUR NEGATIVE 07/15/2016 1020   UROBILINOGEN 0.2 03/22/2010 1131   NITRITE NEGATIVE 07/15/2016 1020   LEUKOCYTESUR LARGE (A) 07/15/2016 1020     Kirrah Mustin M.D. [facility] 07/20/2016, 10:43 AM  Pager: [phone] Between 7am to 7pm - call Pager - [phone]  After 7pm go to [email or web address] - password [email or web address]  Call night coverage person covering after 7pm

## 2016-07-20 NOTE — Progress Notes (Signed)
  ID note:  40yo F with left hand abscess s/p I x D with tenosynovectomy, OR CX + MRSA, currently on vancomycin, dose adjusted yesterday due to being subtherapeutic.  - will recommend to start doxycycline 100mg  BID with meals - if she has nausea, would premedicate with phenergan 25mg  po oral Q 8hr prn - would recommend to discharge on 2-3 wk of oral doxycycline 100mg  BID, follow up with hand surgery - can discontinue IV vanco tonight or tomorrow am   Judyann MunsonSNIDER, [facility] [phone]

## 2016-07-21 MED ORDER — ENSURE ENLIVE PO LIQD
237.0000 mL | ORAL | Status: DC
  Administered 2016-07-21 – 2016-07-22 (×2): 237 mL via ORAL

## 2016-07-21 MED ORDER — DOXYCYCLINE HYCLATE 100 MG PO TABS
100.0000 mg | ORAL_TABLET | ORAL | Status: DC
  Administered 2016-07-21 – 2016-07-22 (×2): 100 mg via ORAL
  Filled 2016-07-21 (×2): qty 1

## 2016-07-21 NOTE — Progress Notes (Signed)
INFECTIOUS DISEASE PROGRESS NOTE  ID: Wendy Douglas is a 40 y.o. female with  Active Problems:   COPD   Cellulitis of left hand   Cellulitis of hand   Trichomonosis   Polysubstance abuse   Malnutrition of moderate degree   Chronic hepatitis C without hepatic coma (HCC)  Subjective: No complaints.   Abtx:  Anti-infectives    Start     Dose/Rate Route Frequency Ordered Stop   07/21/16 2200  doxycycline (VIBRA-TABS) tablet 100 mg     100 mg Oral 2 times per day 07/21/16 1251     07/20/16 1800  doxycycline (VIBRA-TABS) tablet 100 mg  Status:  Discontinued     100 mg Oral 2 times daily with meals 07/20/16 1120 07/21/16 1251   07/20/16 0100  vancomycin (VANCOCIN) IVPB 750 mg/150 ml premix     750 mg 150 mL/hr over 60 Minutes Intravenous Every 8 hours 07/19/16 1615     07/19/16 1630  vancomycin (VANCOCIN) IVPB 1000 mg/200 mL premix     1,000 mg 200 mL/hr over 60 Minutes Intravenous  Once 07/19/16 1615 07/19/16 1807   07/17/16 2300  vancomycin (VANCOCIN) 500 mg in sodium chloride 0.9 % 100 mL IVPB  Status:  Discontinued     500 mg 100 mL/hr over 60 Minutes Intravenous Every 8 hours 07/17/16 1328 07/19/16 1615   07/17/16 1330  vancomycin (VANCOCIN) IVPB 1000 mg/200 mL premix     1,000 mg 200 mL/hr over 60 Minutes Intravenous  Once 07/17/16 1328 07/17/16 1604   07/16/16 1000  metroNIDAZOLE (FLAGYL) tablet 500 mg  Status:  Discontinued     500 mg Oral Every 12 hours 07/15/16 1847 07/17/16 1530   07/15/16 2200  metroNIDAZOLE (FLAGYL) tablet 500 mg  Status:  Discontinued     500 mg Oral Every 12 hours 07/15/16 1718 07/15/16 1847   07/15/16 2130  vancomycin (VANCOCIN) IVPB 750 mg/150 ml premix  Status:  Discontinued     750 mg 150 mL/hr over 60 Minutes Intravenous Every 12 hours 07/15/16 0904 07/15/16 1841   07/15/16 1730  clindamycin (CLEOCIN) IVPB 600 mg  Status:  Discontinued     600 mg 100 mL/hr over 30 Minutes Intravenous Every 8 hours 07/15/16 1718 07/17/16 1301   07/15/16  1130  metroNIDAZOLE (FLAGYL) tablet 2,000 mg     2,000 mg Oral  Once 07/15/16 1128 07/15/16 1135   07/15/16 0930  vancomycin (VANCOCIN) IVPB 1000 mg/200 mL premix     1,000 mg 200 mL/hr over 60 Minutes Intravenous  Once 07/15/16 0904 07/15/16 1051      Medications:  Scheduled: . doxycycline  100 mg Oral 2 times per day  . feeding supplement (ENSURE ENLIVE)  237 mL Oral 2 times per day  . hydrOXYzine  25 mg Oral TID  . ipratropium-albuterol  3 mL Nebulization Once  . multivitamin with minerals  1 tablet Oral Q24H  . nicotine  21 mg Transdermal Daily  . oxyCODONE  10 mg Oral Once  . vancomycin  750 mg Intravenous Q8H    Objective: Vital signs in last 24 hours: Temp:  [98 F (36.7 C)-98.6 F (37 C)] 98 F (36.7 C) (09/18 0620) Pulse Rate:  [69-78] 78 (09/18 0620) Resp:  [16-18] 16 (09/18 0620) BP: (123-133)/(66-78) 133/78 (09/18 0620) SpO2:  [100 %] 100 % (09/18 0620)   General appearance: alert, cooperative and no distress Incision/Wound: :L hand dressed. No proximal warmth or erythema.   Lab Results  Recent Labs  07/19/16  0857  WBC 5.9  HGB 11.1*  HCT 34.2*  NA 137  K 3.7  CL 106  CO2 24  BUN 11  CREATININE 0.65   Liver Panel No results for input(s): PROT, ALBUMIN, AST, ALT, ALKPHOS, BILITOT, BILIDIR, IBILI in the last 72 hours. Sedimentation Rate No results for input(s): ESRSEDRATE in the last 72 hours. C-Reactive Protein No results for input(s): CRP in the last 72 hours.  Microbiology: Recent Results (from the past 240 hour(s))  Blood culture (routine x 2)     Status: None   Collection Time: 07/15/16  8:16 AM  Result Value Ref Range Status   Specimen Description BLOOD  IV  Final   Special Requests IN PEDIATRIC BOTTLE  1CC  Final   Culture NO GROWTH 5 DAYS  Final   Report Status 07/20/2016 FINAL  Final  Blood culture (routine x 2)     Status: Abnormal   Collection Time: 07/15/16  9:00 AM  Result Value Ref Range Status   Specimen Description BLOOD  LEFT HAND  Final   Special Requests IN PEDIATRIC BOTTLE  4CC  Final   Culture  Setup Time   Final    GRAM POSITIVE RODS AEROBIC BOTTLE ONLY CRITICAL RESULT CALLED TO, READ BACK BY AND VERIFIED WITH: J. MILLEN, PHARMD AT 1625 ON 07/16/16 BY C. JESSUP, MLT.    Culture (A)  Final    BACILLUS SPECIES Standardized susceptibility testing for this organism is not available.    Report Status 07/18/2016 FINAL  Final  Blood Culture ID Panel (Reflexed)     Status: None   Collection Time: 07/15/16  9:00 AM  Result Value Ref Range Status   Enterococcus species NOT DETECTED NOT DETECTED Final   Listeria monocytogenes NOT DETECTED NOT DETECTED Final   Staphylococcus species NOT DETECTED NOT DETECTED Final   Staphylococcus aureus NOT DETECTED NOT DETECTED Final   Streptococcus species NOT DETECTED NOT DETECTED Final   Streptococcus agalactiae NOT DETECTED NOT DETECTED Final   Streptococcus pneumoniae NOT DETECTED NOT DETECTED Final   Streptococcus pyogenes NOT DETECTED NOT DETECTED Final   Acinetobacter baumannii NOT DETECTED NOT DETECTED Final   Enterobacteriaceae species NOT DETECTED NOT DETECTED Final   Enterobacter cloacae complex NOT DETECTED NOT DETECTED Final   Escherichia coli NOT DETECTED NOT DETECTED Final   Klebsiella oxytoca NOT DETECTED NOT DETECTED Final   Klebsiella pneumoniae NOT DETECTED NOT DETECTED Final   Proteus species NOT DETECTED NOT DETECTED Final   Serratia marcescens NOT DETECTED NOT DETECTED Final   Haemophilus influenzae NOT DETECTED NOT DETECTED Final   Neisseria meningitidis NOT DETECTED NOT DETECTED Final   Pseudomonas aeruginosa NOT DETECTED NOT DETECTED Final   Candida albicans NOT DETECTED NOT DETECTED Final   Candida glabrata NOT DETECTED NOT DETECTED Final   Candida krusei NOT DETECTED NOT DETECTED Final   Candida parapsilosis NOT DETECTED NOT DETECTED Final   Candida tropicalis NOT DETECTED NOT DETECTED Final  Urine culture     Status: Abnormal    Collection Time: 07/15/16 10:20 AM  Result Value Ref Range Status   Specimen Description URINE, RANDOM  Final   Special Requests NONE  Final   Culture <10,000 COLONIES/mL INSIGNIFICANT GROWTH (A)  Final   Report Status 07/16/2016 FINAL  Final  Surgical pcr screen     Status: Abnormal   Collection Time: 07/15/16  6:27 PM  Result Value Ref Range Status   MRSA, PCR POSITIVE (A) NEGATIVE Final   Staphylococcus aureus POSITIVE (A) NEGATIVE Final  Comment:        The Xpert SA Assay (FDA approved for NASAL specimens in patients over 39 years of age), is one component of a comprehensive surveillance program.  Test performance has been validated by [facility] for patients greater than or equal to 77 year old. It is not intended to diagnose infection nor to guide or monitor treatment. CRITICAL RESULT CALLED TO, READ BACK BY AND VERIFIED WITH: CBilly Coast, RN AT 2027 ON 07/15/16 BY C. JESSUP, MLT.   Aerobic/Anaerobic Culture (surgical/deep wound)     Status: None   Collection Time: 07/15/16  7:51 PM  Result Value Ref Range Status   Specimen Description ABSCESS LEFT HAND  Final   Special Requests PT ON FLAGYL DORSAL LEFT HAND SWAB  Final   Gram Stain   Final    FEW WBC PRESENT,BOTH PMN AND MONONUCLEAR FEW GRAM POSITIVE COCCI IN CLUSTERS    Culture   Final    MODERATE METHICILLIN RESISTANT STAPHYLOCOCCUS AUREUS NO ANAEROBES ISOLATED    Report Status 07/20/2016 FINAL  Final   Organism ID, Bacteria METHICILLIN RESISTANT STAPHYLOCOCCUS AUREUS  Final      Susceptibility   Methicillin resistant staphylococcus aureus - MIC*    CIPROFLOXACIN <=0.5 SENSITIVE Sensitive     ERYTHROMYCIN >=8 RESISTANT Resistant     GENTAMICIN <=0.5 SENSITIVE Sensitive     OXACILLIN >=4 RESISTANT Resistant     TETRACYCLINE <=1 SENSITIVE Sensitive     VANCOMYCIN 1 SENSITIVE Sensitive     TRIMETH/SULFA <=10 SENSITIVE Sensitive     CLINDAMYCIN <=0.25 SENSITIVE Sensitive     RIFAMPIN <=0.5 SENSITIVE Sensitive       Inducible Clindamycin NEGATIVE Sensitive     * MODERATE METHICILLIN RESISTANT STAPHYLOCOCCUS AUREUS  Fungus Culture With Stain (Not @ [facility])     Status: None (Preliminary result)   Collection Time: 07/15/16  7:51 PM  Result Value Ref Range Status   Fungus Stain Final report  Final    Comment: (NOTE) Performed At: [facility] [address] [place], [place] [id number] Mila Homer MD [id number]    Fungus (Mycology) Culture PENDING  Incomplete   Fungal Source LEFT  Final    Comment: HAND DORSAL   Fungus Culture Result     Status: None   Collection Time: 07/15/16  7:51 PM  Result Value Ref Range Status   Result 1 Comment  Final    Comment: (NOTE) KOH/Calcofluor preparation:  no fungus observed. Performed At: [facility] [address] [place], [place] [id number] Mila Homer MD [id number]   Aerobic/Anaerobic Culture (surgical/deep wound)     Status: None   Collection Time: 07/15/16  7:54 PM  Result Value Ref Range Status   Specimen Description TISSUE LEFT HAND  Final   Special Requests DOSAL  PT ON FLAGYL  Final   Gram Stain   Final    MODERATE WBC PRESENT,BOTH PMN AND MONONUCLEAR RARE GRAM POSITIVE COCCI IN PAIRS    Culture   Final    MODERATE METHICILLIN RESISTANT STAPHYLOCOCCUS AUREUS NO ANAEROBES ISOLATED    Report Status 07/20/2016 FINAL  Final   Organism ID, Bacteria METHICILLIN RESISTANT STAPHYLOCOCCUS AUREUS  Final      Susceptibility   Methicillin resistant staphylococcus aureus - MIC*    CIPROFLOXACIN <=0.5 SENSITIVE Sensitive     ERYTHROMYCIN >=8 RESISTANT Resistant     GENTAMICIN <=0.5 SENSITIVE Sensitive     OXACILLIN >=4 RESISTANT Resistant     TETRACYCLINE <=1 SENSITIVE Sensitive  VANCOMYCIN 1 SENSITIVE Sensitive     TRIMETH/SULFA <=10 SENSITIVE Sensitive     CLINDAMYCIN <=0.25 SENSITIVE Sensitive     RIFAMPIN <=0.5 SENSITIVE Sensitive     Inducible Clindamycin NEGATIVE Sensitive     * MODERATE METHICILLIN  RESISTANT STAPHYLOCOCCUS AUREUS  Fungus Culture With Stain (Not @ [facility])     Status: None (Preliminary result)   Collection Time: 07/15/16  7:54 PM  Result Value Ref Range Status   Fungus Stain Final report  Final    Comment: (NOTE) Performed At: [facility] [address] [place], [place] [id number] Mila Homer MD [id number]    Fungus (Mycology) Culture PENDING  Incomplete   Fungal Source TISSUE  Final    Comment: LEFT HAND DORSAL   Fungus Culture Result     Status: None   Collection Time: 07/15/16  7:54 PM  Result Value Ref Range Status   Result 1 Comment  Final    Comment: (NOTE) KOH/Calcofluor preparation:  no fungus observed. Performed At: [facility] [address] [place], [place] [id number] Mila Homer MD VH:[id number]     Studies/Results: No results found.   Assessment/Plan: L hand abscess MRSA  IVDA Hepatitis C  Total days of antibiotics: 6 vancomycin  Home with doxy 100mg  po for 3 weeks.  Available as needed.          Johny Sax Infectious Diseases (pager) [phone] [email or web address] 07/21/2016, 4:35 PM  LOS: 6 days

## 2016-07-21 NOTE — Progress Notes (Signed)
[facility]                                                                              Patient Demographics  Wendy Douglas, is a 40 y.o. female, DOB - [birth year], WJX:[id number]  Admit date - 07/15/2016   Admitting Physician Ozella Rocks, MD  Outpatient Primary MD for the patient is No PCP Per Patient  Outpatient specialists:   LOS - 6  days    Chief Complaint  Patient presents with  . Arm Swelling  . Hand Injury       Brief summary   Wendy Douglas is a 40 y.o. female with medical history significant of COPD, polysubstance abuse/IVDU, presenting with left hand swelling pain in the redness. Started 1 day Prior to admission, the patient reported  attempted cocaine injection between the fourth and fifth fingers on the dorsum of the hand 3 days ago. States that this did not go into the vein directly. Symptoms are constant and getting worse. Denies fevers, chills, neck stiffness, headache, palpitations, chest pain, shortness of breath, nausea, vomiting, diarrhea, dysuria, frequency, vaginal discharge, back pain.    Assessment & Plan   Cellulitis and abscess of hand: Likely secondary to attempted cocaine injection hand.  - MRI showed severe cellulitis of the dorsal aspect of the left hand with complex infiltrative abscess located both superficial and deep to the extensor digitorum tendons, largest abscess 2.2 x 8 x 0.8 centimeter and infectious tenosynovitis. - Hand surgery was consulted, appreciate Dr. Carlos Levering assistance. Patient underwentirrigation and debridement of the deep abscess and radical tenosynovectomy on 9/12, Currently receiving hydrotherapy and wound care -  wound Cultures growing MRSA,  Appreciate ID recommendations, IV antibiotics while inpatient, Bactrim when discharged however patient is allergic to sulfa antibiotics. I discussed with Dr Ninetta Lights today, will see her today, agree with doxycycline, currently patient is tolerating it - will continue  vancomycin IV until further admonitions from ID - I recommended the patient strongly to do her dressing in front of the wound care/hydrotherapy nurse and her RN to ensure that she is able to take care of her dressings outpatient. - Case management consult placed for antibiotics and medical supplies, MATCH program assistance - Hand surgery to follow - If all the above falls in place and patient is comfortable doing her dressing changes, she feels that she will be ready for discharge in a.m.  Trichomonas: present on UA (contaminated sample). Suspect vaginal infection.  - Completed metronidazole  COPD: With slight wheezing - continue albuterol   Polysubstance use: Patient's UDS positive for opiates, cocaine and THC. 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 Currently not in withdrawals, patient appears to be stable on my examination - will continue IV morphine as needed.  - Discussed in detail with the patient, she is reported that she was in a methadone program for 7 years and 6 months ago she quit and started using heroin. She is no longer on methadone. - Patient is not in any withdrawals however continues to ask for IV pain medications.   Nicotine abuse: - Patient smokes half pack per day. - Placed on nicotine  patch   Depression/Anxiety: - continue vistaril, Abilify, Celexa   Code Status: Full code DVT Prophylaxis:  SCD's Family Communication: Discussed in detail with the patient, all imaging results, lab results explained to the patient    Disposition Plan: DC in a.m.  Time Spent in minutes 25 minutes  Procedures:  PROCEDURES: 1. Irrigation and debridement of deep abscess, skin, subcutaneous     tissue, tendon, muscle and bone/periosteum, underwent excisional     debridement with curette, knife and scissor.  This was an 8-cm in     length to various pie-crusted incisions and 2-3 cm in depth,     irrigation and debridement. 2. Radical tenosynovectomy of the extensor  apparatus involving the     second, fourth, fifth compartments, dorsal hand, wrist and forearm   Hydrotherapy  Consultants:   Hand surgery, Dr Amanda Pea ID  Antimicrobials:   IV vancomycin   Medications  Scheduled Meds: . doxycycline  100 mg Oral BID WC  . feeding supplement (ENSURE ENLIVE)  237 mL Oral BID BM  . hydrOXYzine  25 mg Oral TID  . ipratropium-albuterol  3 mL Nebulization Once  . multivitamin with minerals  1 tablet Oral Q24H  . nicotine  21 mg Transdermal Daily  . oxyCODONE  10 mg Oral Once  . vancomycin  750 mg Intravenous Q8H   Continuous Infusions:   PRN Meds:.acetaminophen **OR** acetaminophen, ipratropium-albuterol, morphine injection, ondansetron **OR** ondansetron (ZOFRAN) IV, oxyCODONE   Antibiotics   Anti-infectives    Start     Dose/Rate Route Frequency Ordered Stop   07/20/16 1800  doxycycline (VIBRA-TABS) tablet 100 mg     100 mg Oral 2 times daily with meals 07/20/16 1120     07/20/16 0100  vancomycin (VANCOCIN) IVPB 750 mg/150 ml premix     750 mg 150 mL/hr over 60 Minutes Intravenous Every 8 hours 07/19/16 1615     07/19/16 1630  vancomycin (VANCOCIN) IVPB 1000 mg/200 mL premix     1,000 mg 200 mL/hr over 60 Minutes Intravenous  Once 07/19/16 1615 07/19/16 1807   07/17/16 2300  vancomycin (VANCOCIN) 500 mg in sodium chloride 0.9 % 100 mL IVPB  Status:  Discontinued     500 mg 100 mL/hr over 60 Minutes Intravenous Every 8 hours 07/17/16 1328 07/19/16 1615   07/17/16 1330  vancomycin (VANCOCIN) IVPB 1000 mg/200 mL premix     1,000 mg 200 mL/hr over 60 Minutes Intravenous  Once 07/17/16 1328 07/17/16 1604   07/16/16 1000  metroNIDAZOLE (FLAGYL) tablet 500 mg  Status:  Discontinued     500 mg Oral Every 12 hours 07/15/16 1847 07/17/16 1530   07/15/16 2200  metroNIDAZOLE (FLAGYL) tablet 500 mg  Status:  Discontinued     500 mg Oral Every 12 hours 07/15/16 1718 07/15/16 1847   07/15/16 2130  vancomycin (VANCOCIN) IVPB 750 mg/150 ml premix   Status:  Discontinued     750 mg 150 mL/hr over 60 Minutes Intravenous Every 12 hours 07/15/16 0904 07/15/16 1841   07/15/16 1730  clindamycin (CLEOCIN) IVPB 600 mg  Status:  Discontinued     600 mg 100 mL/hr over 30 Minutes Intravenous Every 8 hours 07/15/16 1718 07/17/16 1301   07/15/16 1130  metroNIDAZOLE (FLAGYL) tablet 2,000 mg     2,000 mg Oral  Once 07/15/16 1128 07/15/16 1135   07/15/16 0930  vancomycin (VANCOCIN) IVPB 1000 mg/200 mL premix     1,000 mg 200 mL/hr over 60 Minutes Intravenous  Once 07/15/16 0904  07/15/16 1051        Subjective:   Wendy Douglas was seen and examined today. Feels better today, much less pain in left hand. No fevers or chills. Denies chest pain, shortness of breath, abdominal pain, N/V/D/C, new weakness, numbess, tingling. No acute events overnight.    Objective:   Vitals:   07/20/16 0550 07/20/16 1415 07/20/16 2322 07/21/16 0620  BP: (!) 148/81 (!) 143/70 123/66 133/78  Pulse: 77 79 69 78  Resp: 19 19 18 16   Temp: 98.6 F (37 C) 98.7 F (37.1 C) 98.6 F (37 C) 98 F (36.7 C)  TempSrc: Oral Oral Oral Oral  SpO2: 100% 100% 100% 100%  Weight:      Height:        Intake/Output Summary (Last 24 hours) at 07/21/16 1150 Last data filed at 07/21/16 0454  Gross per 24 hour  Intake              780 ml  Output              300 ml  Net              480 ml     Wt Readings from Last 3 Encounters:  07/15/16 56.7 kg (125 lb)  05/31/16 54.4 kg (120 lb)  12/15/14 68 kg (150 lb)     Exam  General: Alert and oriented x 3, NAD  HEENT:    Neck:   Cardiovascular: S1 S2 clear, RRR  Respiratory: CTAB  Gastrointestinal: Soft, NT, ND,NBS  Ext: no cyanosis clubbing or edema. Left hand with dressing intact  Neuro: no new deficits   Skin: No rashes  Psych: Normal affect and demeanor, alert and oriented x3    Data Reviewed:  I have personally reviewed following labs and imaging studies  Micro Results Recent Results (from the past  240 hour(s))  Blood culture (routine x 2)     Status: None   Collection Time: 07/15/16  8:16 AM  Result Value Ref Range Status   Specimen Description BLOOD  IV  Final   Special Requests IN PEDIATRIC BOTTLE  1CC  Final   Culture NO GROWTH 5 DAYS  Final   Report Status 07/20/2016 FINAL  Final  Blood culture (routine x 2)     Status: Abnormal   Collection Time: 07/15/16  9:00 AM  Result Value Ref Range Status   Specimen Description BLOOD LEFT HAND  Final   Special Requests IN PEDIATRIC BOTTLE  4CC  Final   Culture  Setup Time   Final    GRAM POSITIVE RODS AEROBIC BOTTLE ONLY CRITICAL RESULT CALLED TO, READ BACK BY AND VERIFIED WITH: J. MILLEN, PHARMD AT 1625 ON 07/16/16 BY C. JESSUP, MLT.    Culture (A)  Final    BACILLUS SPECIES Standardized susceptibility testing for this organism is not available.    Report Status 07/18/2016 FINAL  Final  Blood Culture ID Panel (Reflexed)     Status: None   Collection Time: 07/15/16  9:00 AM  Result Value Ref Range Status   Enterococcus species NOT DETECTED NOT DETECTED Final   Listeria monocytogenes NOT DETECTED NOT DETECTED Final   Staphylococcus species NOT DETECTED NOT DETECTED Final   Staphylococcus aureus NOT DETECTED NOT DETECTED Final   Streptococcus species NOT DETECTED NOT DETECTED Final   Streptococcus agalactiae NOT DETECTED NOT DETECTED Final   Streptococcus pneumoniae NOT DETECTED NOT DETECTED Final   Streptococcus pyogenes NOT DETECTED NOT DETECTED Final   Acinetobacter  baumannii NOT DETECTED NOT DETECTED Final   Enterobacteriaceae species NOT DETECTED NOT DETECTED Final   Enterobacter cloacae complex NOT DETECTED NOT DETECTED Final   Escherichia coli NOT DETECTED NOT DETECTED Final   Klebsiella oxytoca NOT DETECTED NOT DETECTED Final   Klebsiella pneumoniae NOT DETECTED NOT DETECTED Final   Proteus species NOT DETECTED NOT DETECTED Final   Serratia marcescens NOT DETECTED NOT DETECTED Final   Haemophilus influenzae NOT  DETECTED NOT DETECTED Final   Neisseria meningitidis NOT DETECTED NOT DETECTED Final   Pseudomonas aeruginosa NOT DETECTED NOT DETECTED Final   Candida albicans NOT DETECTED NOT DETECTED Final   Candida glabrata NOT DETECTED NOT DETECTED Final   Candida krusei NOT DETECTED NOT DETECTED Final   Candida parapsilosis NOT DETECTED NOT DETECTED Final   Candida tropicalis NOT DETECTED NOT DETECTED Final  Urine culture     Status: Abnormal   Collection Time: 07/15/16 10:20 AM  Result Value Ref Range Status   Specimen Description URINE, RANDOM  Final   Special Requests NONE  Final   Culture <10,000 COLONIES/mL INSIGNIFICANT GROWTH (A)  Final   Report Status 07/16/2016 FINAL  Final  Surgical pcr screen     Status: Abnormal   Collection Time: 07/15/16  6:27 PM  Result Value Ref Range Status   MRSA, PCR POSITIVE (A) NEGATIVE Final   Staphylococcus aureus POSITIVE (A) NEGATIVE Final    Comment:        The Xpert SA Assay (FDA approved for NASAL specimens in patients over 77 years of age), is one component of a comprehensive surveillance program.  Test performance has been validated by [facility] for patients greater than or equal to 3 year old. It is not intended to diagnose infection nor to guide or monitor treatment. CRITICAL RESULT CALLED TO, READ BACK BY AND VERIFIED WITH: CBilly Coast, RN AT 2027 ON 07/15/16 BY C. JESSUP, MLT.   Aerobic/Anaerobic Culture (surgical/deep wound)     Status: None   Collection Time: 07/15/16  7:51 PM  Result Value Ref Range Status   Specimen Description ABSCESS LEFT HAND  Final   Special Requests PT ON FLAGYL DORSAL LEFT HAND SWAB  Final   Gram Stain   Final    FEW WBC PRESENT,BOTH PMN AND MONONUCLEAR FEW GRAM POSITIVE COCCI IN CLUSTERS    Culture   Final    MODERATE METHICILLIN RESISTANT STAPHYLOCOCCUS AUREUS NO ANAEROBES ISOLATED    Report Status 07/20/2016 FINAL  Final   Organism ID, Bacteria METHICILLIN RESISTANT STAPHYLOCOCCUS AUREUS  Final       Susceptibility   Methicillin resistant staphylococcus aureus - MIC*    CIPROFLOXACIN <=0.5 SENSITIVE Sensitive     ERYTHROMYCIN >=8 RESISTANT Resistant     GENTAMICIN <=0.5 SENSITIVE Sensitive     OXACILLIN >=4 RESISTANT Resistant     TETRACYCLINE <=1 SENSITIVE Sensitive     VANCOMYCIN 1 SENSITIVE Sensitive     TRIMETH/SULFA <=10 SENSITIVE Sensitive     CLINDAMYCIN <=0.25 SENSITIVE Sensitive     RIFAMPIN <=0.5 SENSITIVE Sensitive     Inducible Clindamycin NEGATIVE Sensitive     * MODERATE METHICILLIN RESISTANT STAPHYLOCOCCUS AUREUS  Fungus Culture With Stain (Not @ [facility])     Status: None (Preliminary result)   Collection Time: 07/15/16  7:51 PM  Result Value Ref Range Status   Fungus Stain Final report  Final    Comment: (NOTE) Performed At: [facility] [address] [place], [place] [id number] Mila Homer MD NW:[id number]  Fungus (Mycology) Culture PENDING  Incomplete   Fungal Source LEFT  Final    Comment: HAND DORSAL   Fungus Culture Result     Status: None   Collection Time: 07/15/16  7:51 PM  Result Value Ref Range Status   Result 1 Comment  Final    Comment: (NOTE) KOH/Calcofluor preparation:  no fungus observed. Performed At: [facility] [address] [place], [place] [id number] Mila Homer MD JY:[id number]   Aerobic/Anaerobic Culture (surgical/deep wound)     Status: None   Collection Time: 07/15/16  7:54 PM  Result Value Ref Range Status   Specimen Description TISSUE LEFT HAND  Final   Special Requests DOSAL  PT ON FLAGYL  Final   Gram Stain   Final    MODERATE WBC PRESENT,BOTH PMN AND MONONUCLEAR RARE GRAM POSITIVE COCCI IN PAIRS    Culture   Final    MODERATE METHICILLIN RESISTANT STAPHYLOCOCCUS AUREUS NO ANAEROBES ISOLATED    Report Status 07/20/2016 FINAL  Final   Organism ID, Bacteria METHICILLIN RESISTANT STAPHYLOCOCCUS AUREUS  Final      Susceptibility   Methicillin resistant staphylococcus aureus - MIC*     CIPROFLOXACIN <=0.5 SENSITIVE Sensitive     ERYTHROMYCIN >=8 RESISTANT Resistant     GENTAMICIN <=0.5 SENSITIVE Sensitive     OXACILLIN >=4 RESISTANT Resistant     TETRACYCLINE <=1 SENSITIVE Sensitive     VANCOMYCIN 1 SENSITIVE Sensitive     TRIMETH/SULFA <=10 SENSITIVE Sensitive     CLINDAMYCIN <=0.25 SENSITIVE Sensitive     RIFAMPIN <=0.5 SENSITIVE Sensitive     Inducible Clindamycin NEGATIVE Sensitive     * MODERATE METHICILLIN RESISTANT STAPHYLOCOCCUS AUREUS  Fungus Culture With Stain (Not @ [facility])     Status: None (Preliminary result)   Collection Time: 07/15/16  7:54 PM  Result Value Ref Range Status   Fungus Stain Final report  Final    Comment: (NOTE) Performed At: [facility] [address] [place], [place] [id number] Mila Homer MD EX:[id number]    Fungus (Mycology) Culture PENDING  Incomplete   Fungal Source TISSUE  Final    Comment: LEFT HAND DORSAL   Fungus Culture Result     Status: None   Collection Time: 07/15/16  7:54 PM  Result Value Ref Range Status   Result 1 Comment  Final    Comment: (NOTE) KOH/Calcofluor preparation:  no fungus observed. Performed At: [facility] [address] [place], [place] [id number] Mila Homer MD YQ:[id number]     Radiology Reports Dg Chest 2 View  Result Date: 07/15/2016 CLINICAL DATA:  Rhonchi on chest auscultation.  Smoker. EXAM: CHEST  2 VIEW COMPARISON:  Single-view of the chest 05/31/2016. PA and lateral chest 12/15/2014. CT chest 11/15/2012. FINDINGS: The chest is hyperexpanded with peribronchial thickening. A small focus of airspace opacity is seen in the right upper lobe on the frontal pain and appears new since the most recent examination. Heart size is normal. No pneumothorax or pleural effusion. IMPRESSION: Findings compatible with COPD. Small focus of airspace opacity in the right upper lobe may be atelectasis or infection. Recommend repeat PA and lateral films in 3-4 weeks to ensure  resolution. Electronically Signed   By: Drusilla Kanner M.D.   On: 07/15/2016 15:02   Dg Forearm Left  Result Date: 07/15/2016 CLINICAL DATA:  Left hand and 4 arm swelling. Complains of numbness and tingling. EXAM: LEFT FOREARM - 2 VIEW COMPARISON:  None. FINDINGS: Frontal and lateral views of the  left forearm demonstrate diffuse subcutaneous soft tissue swelling of the left forearm. There is no acute displaced fracture. No significant elbow effusion. Radial head alignment normal. No radiopaque foreign body. No gas collections in the soft tissues. IMPRESSION: 1. Diffuse soft tissue swelling of the left forearm 2. No radiographic evidence for acute osseous abnormality Electronically Signed   By: Jasmine PangKim  Fujinaga M.D.   On: 07/15/2016 11:58   Mr Hand Left W Wo Contrast  Result Date: 07/15/2016 CLINICAL DATA:  Redness and swelling of the hand. Presents with left hand and distal left arm swelling over the past several hours. EXAM: MR OF THE LEFT WRIST WITHOUT AND WITH CONTRAST TECHNIQUE: Multiplanar multisequence MR imaging of the left wrist was performed both before and after the administration of intravenous contrast. CONTRAST:  10mL MULTIHANCE GADOBENATE DIMEGLUMINE 529 MG/ML IV SOLN COMPARISON:  None. FINDINGS: Ligaments:  Intact scapholunate and lunotriquetral ligaments. Triangular fibrocartilage: Central perforation of the TFCC. Tendons: Intact flexor compartment tendons. Tenosynovitis of the extensor digitorum, extensor carpi radialis brevis and extensor carpi radialis longus. Carpal tunnel/median nerve: Normal carpal tunnel. Normal median nerve. Guyon's canal: Normal. Joint/cartilage: No joint effusion. No chondral defect. Bones/carpal alignment: No marrow signal abnormality. Normal alignment. No aggressive osseous lesion. Other: Severe soft tissue swelling enhancement of the dorsal aspect of the to lesser extent volar aspect of the hand along the thenar are eminence. Complex fluid collection infiltrating  the dorsal soft tissues with the largest fluid collection measuring 2.2 x 81.4 x 0.8 cm at the level of the third-fourth mid metacarpal with a focal area extending deep to the extensor digitorum of the fourth digit at the level of the MCP joint. The fluid collection is located both superficial and deep to the extensor digitorum tendons of the second, third, fourth and fifth digits. IMPRESSION: 1. Severe cellulitis of the dorsal aspect of the left hand with a complex infiltrative abscess along the dorsal aspect of the hand which is located both superficial and deep to the extensor digitorum tendons. Largest abscess measures 2.2 x 81.4 x 0.8 cm at the level of the third-fourth mid metacarpal with a focal area extending deep to the extensor digitorum of the fourth digit at the level of the MCP joint. 2. Tenosynovitis of the extensor digitorum, extensor carpi radialis brevis and extensor carpi radialis longus. These results were called by telephone at the time of interpretation on 07/15/2016 at 6:18 pm to Dr. Amanda PeaGRAMIG, who verbally acknowledged these results. Electronically Signed   By: Elige KoHetal  Patel   On: 07/15/2016 18:19   Dg Hand Complete Left  Result Date: 07/15/2016 CLINICAL DATA:  Left hand swelling that started a few days ago after injected hand with cocaine. For arm numbness and tingling. EXAM: LEFT HAND - COMPLETE 3+ VIEW COMPARISON:  None. FINDINGS: PA oblique and lateral views left hand. No acute displaced fracture or malalignment. Diffuse soft tissue swelling over the dorsum of the hand. No radiopaque foreign body or soft tissue gas collections. IMPRESSION: Marked swelling over the dorsum of the left hand. No acute underlying osseous abnormality. Electronically Signed   By: Jasmine PangKim  Fujinaga M.D.   On: 07/15/2016 11:57    Lab Data:  CBC:  Recent Labs Lab 07/15/16 0816 07/16/16 0315 07/17/16 0910 07/18/16 0657 07/19/16 0857  WBC 12.3* 10.2 6.4 6.4 5.9  NEUTROABS 8.9*  --   --   --   --   HGB 14.2  11.8* 11.5* 11.6* 11.1*  HCT 42.4 36.7 36.4 35.5* 34.2*  MCV 95.3  97.1 97.6 96.2 96.6  PLT 216 191 164 162 150   Basic Metabolic Panel:  Recent Labs Lab 07/15/16 0816 07/16/16 0315 07/17/16 0910 07/18/16 0657 07/19/16 0857  NA 135 138 140 141 137  K 3.9 3.8 4.5 4.3 3.7  CL 102 105 110 111 106  CO2 22 27 22 26 24   GLUCOSE 98 102* 127* 87 119*  BUN 8 8 8 9 11   CREATININE 0.69 0.85 0.79 0.72 0.65  CALCIUM 9.2 8.7* 8.5* 8.7* 8.5*   GFR: Estimated Creatinine Clearance: 82.8 mL/min (by C-G formula based on SCr of 0.65 mg/dL). Liver Function Tests:  Recent Labs Lab 07/15/16 0816  AST 22  ALT 18  ALKPHOS 123  BILITOT 0.6  PROT 7.8  ALBUMIN 4.1   No results for input(s): LIPASE, AMYLASE in the last 168 hours. No results for input(s): AMMONIA in the last 168 hours. Coagulation Profile: No results for input(s): INR, PROTIME in the last 168 hours. Cardiac Enzymes: No results for input(s): CKTOTAL, CKMB, CKMBINDEX, TROPONINI in the last 168 hours. BNP (last 3 results) No results for input(s): PROBNP in the last 8760 hours. HbA1C: No results for input(s): HGBA1C in the last 72 hours. CBG: No results for input(s): GLUCAP in the last 168 hours. Lipid Profile: No results for input(s): CHOL, HDL, LDLCALC, TRIG, CHOLHDL, LDLDIRECT in the last 72 hours. Thyroid Function Tests: No results for input(s): TSH, T4TOTAL, FREET4, T3FREE, THYROIDAB in the last 72 hours. Anemia Panel: No results for input(s): VITAMINB12, FOLATE, FERRITIN, TIBC, IRON, RETICCTPCT in the last 72 hours. Urine analysis:    Component Value Date/Time   COLORURINE COLORLESS (A) 07/15/2016 1020   APPEARANCEUR CLEAR 07/15/2016 1020   LABSPEC <1.005 (L) 07/15/2016 1020   PHURINE 6.0 07/15/2016 1020   GLUCOSEU NEGATIVE 07/15/2016 1020   HGBUR TRACE (A) 07/15/2016 1020   BILIRUBINUR NEGATIVE 07/15/2016 1020   KETONESUR NEGATIVE 07/15/2016 1020   PROTEINUR NEGATIVE 07/15/2016 1020   UROBILINOGEN 0.2  03/22/2010 1131   NITRITE NEGATIVE 07/15/2016 1020   LEUKOCYTESUR LARGE (A) 07/15/2016 1020     Jaiden Dinkins M.D. [facility] 07/21/2016, 11:50 AM  Pager: [phone] Between 7am to 7pm - call Pager - [phone]  After 7pm go to [email or web address] - password [email or web address]  Call night coverage person covering after 7pm

## 2016-07-21 NOTE — Progress Notes (Signed)
Physical Therapy Wound Treatment Patient Details  Name: Locklyn Henriquez MRN: [id number] Date of Birth: [birth year]  Today's Date: 07/21/2016 Time: 1135-1205 Time Calculation (min): 30 min  Subjective  Subjective: No c/o's Patient and Family Stated Goals: get better Date of Onset: 07/12/16 Prior Treatments: s/p surgical I&D  Pain Score: Pain Score: moderate. Premedicated  Wound Assessment  Wound / Incision (Open or Dehisced) 07/16/16 Incision - Open Hand Left;Posterior;Proximal s/p I&D (Active)  Dressing Type Compression wrap;Gauze (Comment);Moist to dry 07/21/2016  2:05 PM  Dressing Changed Changed 07/21/2016  2:05 PM  Dressing Status Clean;Dry;Intact 07/21/2016  2:05 PM  Dressing Change Frequency Daily 07/21/2016  2:05 PM  Site / Wound Assessment Red 07/21/2016  2:05 PM  % Wound base Red or Granulating 100% 07/21/2016  2:05 PM  % Wound base Yellow 0% 07/21/2016  2:05 PM  % Wound base Black 0% 07/21/2016  2:05 PM  % Wound base Other (Comment) 0% 07/21/2016  2:05 PM  Peri-wound Assessment Erythema (blanchable);Edema 07/21/2016  2:05 PM  Wound Length (cm) 1.5 cm 07/16/2016 12:38 PM  Wound Width (cm) 0.5 cm 07/16/2016 12:38 PM  Wound Depth (cm) 0.3 cm 07/16/2016 12:38 PM  Margins Unattached edges (unapproximated) 07/21/2016  2:05 PM  Closure None 07/21/2016  2:05 PM  Drainage Amount Minimal 07/21/2016  2:05 PM  Drainage Description Serosanguineous 07/21/2016  2:05 PM  Treatment Hydrotherapy (Pulse lavage);Packing (Saline gauze) 07/21/2016  2:05 PM     Wound / Incision (Open or Dehisced) 07/16/16 Incision - Open Hand Left;Posterior;Mid s/p I&D (Active)  Dressing Type Compression wrap;Gauze (Comment);Moist to dry 07/21/2016  2:05 PM  Dressing Changed Changed 07/21/2016  2:05 PM  Dressing Status Clean;Dry;Intact 07/21/2016  2:05 PM  Dressing Change Frequency Daily 07/21/2016  2:05 PM  Site / Wound Assessment Red 07/21/2016  2:05 PM  % Wound base Red or Granulating 80% 07/21/2016  2:05 PM  % Wound base  Yellow 20% 07/21/2016  2:05 PM  % Wound base Black 0% 07/21/2016  2:05 PM  % Wound base Other (Comment) 0% 07/21/2016  2:05 PM  Peri-wound Assessment Edema;Erythema (blanchable) 07/21/2016  2:05 PM  Wound Length (cm) 1.5 cm 07/16/2016 12:38 PM  Wound Width (cm) 0.5 cm 07/16/2016 12:38 PM  Wound Depth (cm) 0.3 cm 07/16/2016 12:38 PM  Margins Unattached edges (unapproximated) 07/21/2016  2:05 PM  Closure None 07/21/2016  2:05 PM  Drainage Amount Minimal 07/21/2016  2:05 PM  Drainage Description Serosanguineous 07/21/2016  2:05 PM  Treatment Hydrotherapy (Pulse lavage);Packing (Saline gauze) 07/21/2016  2:05 PM     Wound / Incision (Open or Dehisced) 07/16/16 Incision - Open Hand Left;Posterior;Distal s/p I&D (Active)  Dressing Type Compression wrap;Gauze (Comment);Moist to dry 07/21/2016  2:05 PM  Dressing Changed Changed 07/21/2016  2:05 PM  Dressing Status Clean;Dry;Intact 07/21/2016  2:05 PM  Dressing Change Frequency Daily 07/21/2016  2:05 PM  Site / Wound Assessment Red;Bleeding 07/21/2016  2:05 PM  % Wound base Red or Granulating 100% 07/21/2016  2:05 PM  % Wound base Yellow 0% 07/21/2016  2:05 PM  % Wound base Black 0% 07/21/2016  2:05 PM  % Wound base Other (Comment) 0% 07/21/2016  2:05 PM  Peri-wound Assessment Edema;Erythema (blanchable) 07/21/2016  2:05 PM  Wound Length (cm) 3 cm 07/16/2016 12:38 PM  Wound Width (cm) 0.3 cm 07/16/2016 12:38 PM  Wound Depth (cm) 0.3 cm 07/16/2016 12:38 PM  Margins Unattached edges (unapproximated) 07/21/2016  2:05 PM  Closure Surface sutures 07/21/2016  2:05 PM  Drainage Amount Moderate 07/21/2016  2:05 PM  Drainage Description Serosanguineous 07/21/2016  2:05 PM  Treatment Hydrotherapy (Pulse lavage);Packing (Saline gauze) 07/21/2016  2:05 PM   Hydrotherapy Pulsed lavage therapy - wound location: dorsum of lt hand Pulsed Lavage with Suction (psi): 4 psi Pulsed Lavage with Suction - Normal Saline Used: 1000 mL Pulsed Lavage Tip: Tip with splash shield   Wound  Assessment and Plan  Wound Therapy - Assess/Plan/Recommendations Wound Therapy - Clinical Statement: Pt demonstrated dressing change. Wounds look good. Wound Therapy - Functional Problem List: Decr use of left hand Factors Delaying/Impairing Wound Healing: Infection - systemic/local;Substance abuse;Tobacco use Hydrotherapy Plan: Dressing change;Patient/family education;Pulsatile lavage with suction Wound Therapy - Frequency: 6X / week Wound Therapy - Follow Up Recommendations: Other (comment) (Per MD. Likely self dressing change.) Wound Plan: See above  Wound Therapy Goals- Improve the function of patient's integumentary system by progressing the wound(s) through the phases of wound healing (inflammation - proliferation - remodeling) by: Decrease Length/Width/Depth by (cm): depth by .1 Decrease Length/Width/Depth - Progress: Progressing toward goal Improve Drainage Characteristics: Min Improve Drainage Characteristics - Progress: Progressing toward goal  Goals will be updated until maximal potential achieved or discharge criteria met.  Discharge criteria: when goals achieved, discharge from hospital, MD decision/surgical intervention, no progress towards goals, refusal/missing three consecutive treatments without notification or medical reason.  GP     Bayani Renteria 07/21/2016, 2:11 PM [facility] PT [phone]

## 2016-07-21 NOTE — Care Management Note (Addendum)
Case Management Note  Patient Details  Name: Wendy Douglas MRN: [id number] Date of Birth: 01/15/1976  Subjective/Objective:                    Action/Plan:  Explained MATCH dated 07-21-16 to 07-28-16    program to patient , she has used it before. Entered exception for 14 days of oxycodone and co pay over ride for oxycodone and Doxycycline , unable to enter co pay over ride for phenergan .   [facility]ided [facility] information to patient.   Patient voiced understanding of all of the above.  Will ask discharging nurse to give patient some extra supplies  Expected Discharge Date:                  Expected Discharge Plan:  Home/Self Care  In-House Referral:     Discharge planning Services  CM Consult, Medication Assistance, MATCH Program, Indigent Health Clinic  Post Acute Care Choice:    Choice offered to:  Patient  DME Arranged:    DME Agency:     HH Arranged:    HH Agency:     Status of Service:  Completed, signed off  If discussed at [employer] Meetings, dates discussed:    Additional Comments:  Kingsley PlanWile, Shray Hunley Marie, RN 07/21/2016, 10:39 AM

## 2016-07-22 MED ORDER — NICOTINE 21 MG/24HR TD PT24: 21.0000 mg | MEDICATED_PATCH | Freq: Every day | TRANSDERMAL | 0 refills | Status: DC

## 2016-07-22 MED ORDER — DOXYCYCLINE HYCLATE 100 MG PO TABS: 100.0000 mg | ORAL_TABLET | Freq: Two times a day (BID) | ORAL | 0 refills | Status: DC

## 2016-07-22 MED ORDER — GABAPENTIN 600 MG PO TABS: 300.0000 mg | ORAL_TABLET | Freq: Three times a day (TID) | ORAL | 0 refills | Status: DC

## 2016-07-22 MED ORDER — ALBUTEROL SULFATE HFA 108 (90 BASE) MCG/ACT IN AERS: 1.0000 | INHALATION_SPRAY | Freq: Four times a day (QID) | RESPIRATORY_TRACT | 2 refills | Status: AC | PRN

## 2016-07-22 MED ORDER — HYDROXYZINE HCL 25 MG PO TABS: 25.0000 mg | ORAL_TABLET | Freq: Three times a day (TID) | ORAL | 0 refills | Status: DC

## 2016-07-22 NOTE — Care Management (Signed)
Follow up appointment with  Sheran LawlessBUCHANAN,BRIAN L, PA-C  Orthopedic Surgery [phone] [address] [place] [id number]    Instructions: Thursday July 24, 2016 at 0815 am      [facility] , they are booked right now , but patient can call or walk in Monday July 28, 2016 to schedule appointment . Bedside nurse will give patient some dressing supplies .   Patient has MATCH letter from yesterday.   Spoke with patient and voiced understanding of all of the above.   Ronny FlurryHeather Lindyn Vossler RN BSN [phone]

## 2016-07-22 NOTE — Progress Notes (Signed)
Discharge instructions given. DC'd home accompanied by friend.

## 2016-07-22 NOTE — Progress Notes (Signed)
Physical Therapy Wound Treatment Patient Details  Name: Wendy Douglas MRN: [id number] Date of Birth: August 25, 1976  Today's Date: 07/22/2016 Time: 1120-1149 Time Calculation (min): 29 min  Subjective  Subjective: No c/o's Patient and Family Stated Goals: get better Date of Onset: 07/12/16 Prior Treatments: s/p surgical I&D  Pain Score: Pain Score: Premedicated.  Wound Assessment  Wound / Incision (Open or Dehisced) 07/16/16 Incision - Open Hand Left;Posterior;Proximal s/p I&D (Active)  Dressing Type Compression wrap;Gauze (Comment);Moist to dry 07/22/2016  1:10 PM  Dressing Changed Changed 07/22/2016  1:10 PM  Dressing Status Clean;Dry;Intact 07/22/2016  1:10 PM  Dressing Change Frequency Daily 07/22/2016  1:10 PM  Site / Wound Assessment Red 07/22/2016  1:10 PM  % Wound base Red or Granulating 80% 07/22/2016  1:10 PM  % Wound base Yellow 20% 07/22/2016  1:10 PM  % Wound base Black 0% 07/22/2016  1:10 PM  % Wound base Other (Comment) 0% 07/22/2016  1:10 PM  Peri-wound Assessment Erythema (blanchable);Edema 07/22/2016  1:10 PM  Wound Length (cm) 1.5 cm 07/16/2016 12:38 PM  Wound Width (cm) 0.5 cm 07/16/2016 12:38 PM  Wound Depth (cm) 0.3 cm 07/16/2016 12:38 PM  Margins Unattached edges (unapproximated) 07/22/2016  1:10 PM  Closure None 07/22/2016  1:10 PM  Drainage Amount Scant 07/22/2016  1:10 PM  Drainage Description Serosanguineous 07/22/2016  1:10 PM  Treatment Hydrotherapy (Pulse lavage);Packing (Saline gauze) 07/22/2016  1:10 PM     Wound / Incision (Open or Dehisced) 07/16/16 Incision - Open Hand Left;Posterior;Mid s/p I&D (Active)  Dressing Type Compression wrap;Gauze (Comment);Moist to dry 07/22/2016  1:10 PM  Dressing Changed Changed 07/22/2016  1:10 PM  Dressing Status Clean;Dry;Intact 07/22/2016  1:10 PM  Dressing Change Frequency Daily 07/22/2016  1:10 PM  Site / Wound Assessment Red 07/22/2016  1:10 PM  % Wound base Red or Granulating 80% 07/22/2016  1:10 PM  % Wound base Yellow 20%  07/22/2016  1:10 PM  % Wound base Black 0% 07/22/2016  1:10 PM  % Wound base Other (Comment) 0% 07/22/2016  1:10 PM  Peri-wound Assessment Edema;Erythema (blanchable) 07/22/2016  1:10 PM  Wound Length (cm) 1.5 cm 07/16/2016 12:38 PM  Wound Width (cm) 0.5 cm 07/16/2016 12:38 PM  Wound Depth (cm) 0.3 cm 07/16/2016 12:38 PM  Margins Unattached edges (unapproximated) 07/22/2016  1:10 PM  Closure None 07/22/2016  1:10 PM  Drainage Amount Scant 07/22/2016  1:10 PM  Drainage Description Serosanguineous 07/22/2016  1:10 PM  Treatment Hydrotherapy (Pulse lavage);Packing (Saline gauze) 07/22/2016  1:10 PM     Wound / Incision (Open or Dehisced) 07/16/16 Incision - Open Hand Left;Posterior;Distal s/p I&D (Active)  Dressing Type Compression wrap;Gauze (Comment);Moist to dry 07/22/2016  1:10 PM  Dressing Changed Changed 07/22/2016  1:10 PM  Dressing Status Clean;Dry;Intact 07/22/2016  1:10 PM  Dressing Change Frequency Daily 07/22/2016  1:10 PM  Site / Wound Assessment Red;Bleeding 07/22/2016  1:10 PM  % Wound base Red or Granulating 80% 07/22/2016  1:10 PM  % Wound base Yellow 20% 07/22/2016  1:10 PM  % Wound base Black 0% 07/22/2016  1:10 PM  % Wound base Other (Comment) 0% 07/22/2016  1:10 PM  Peri-wound Assessment Edema;Erythema (blanchable) 07/22/2016  1:10 PM  Wound Length (cm) 3 cm 07/16/2016 12:38 PM  Wound Width (cm) 0.3 cm 07/16/2016 12:38 PM  Wound Depth (cm) 0.3 cm 07/16/2016 12:38 PM  Margins Unattached edges (unapproximated) 07/22/2016  1:10 PM  Closure Surface sutures 07/22/2016  1:10 PM  Drainage Amount Moderate 07/22/2016  1:10 PM  Drainage Description Serosanguineous 07/22/2016  1:10 PM  Treatment Hydrotherapy (Pulse lavage);Packing (Saline gauze) 07/22/2016  1:10 PM   Hydrotherapy Pulsed lavage therapy - wound location: dorsum of lt hand Pulsed Lavage with Suction (psi): 4 psi Pulsed Lavage with Suction - Normal Saline Used: 1000 mL Pulsed Lavage Tip: Tip with splash shield   Wound Assessment and Plan   Wound Therapy - Assess/Plan/Recommendations Wound Therapy - Clinical Statement: Pt ready for dc from hydrotherapy standpoint Wound Therapy - Functional Problem List: Decr use of left hand Factors Delaying/Impairing Wound Healing: Infection - systemic/local;Substance abuse;Tobacco use Hydrotherapy Plan: Dressing change;Patient/family education;Pulsatile lavage with suction Wound Therapy - Frequency: 6X / week Wound Therapy - Follow Up Recommendations: Other (comment) (Per MD. Likely self dressing change.) Wound Plan: See above  Wound Therapy Goals- Improve the function of patient's integumentary system by progressing the wound(s) through the phases of wound healing (inflammation - proliferation - remodeling) by: Decrease Length/Width/Depth by (cm): depth by .1 Decrease Length/Width/Depth - Progress: Progressing toward goal Improve Drainage Characteristics: Min Improve Drainage Characteristics - Progress: Progressing toward goal  Goals will be updated until maximal potential achieved or discharge criteria met.  Discharge criteria: when goals achieved, discharge from hospital, MD decision/surgical intervention, no progress towards goals, refusal/missing three consecutive treatments without notification or medical reason.  GP     Dalisha Shively 07/22/2016, 1:14 PM [place]

## 2016-07-22 NOTE — Discharge Summary (Signed)
Physician Discharge Summary   Patient ID: Wendy Douglas MRN: [id number] DOB/AGE: 1976/04/19 40 y.o.  Admit date: 07/15/2016 Discharge date: 07/22/2016  Primary Care Physician:  No PCP Per Patient  Discharge Diagnoses:    . Cellulitis And abscess of left hand   MRSA abscess  . COPD   Trichomonas    Polysubstance abuse Nicotine abuse  Consults:  Hand surgery, Dr. Amanda Pea  Recommendations for Outpatient Follow-up:  1. Patient was recommended dressing changes daily, has an appointment scheduled with Wendy Douglas with hand surgery on 07/24/16 for wound check at 8:15 AM. Patient was also recommended to walk-in for hospital follow-up on September 25 at [facility]. 2. Please repeat CBC/BMET at next visit 3. She was given dressing supplies by bedside nurse and patient demonstrated dressing changes in front of the hydrotherapy nurse   DIET: Regular diet    Allergies:   Allergies  Allergen Reactions  . Bee Venom Shortness Of Breath and Swelling  . Sulfa Antibiotics Rash     DISCHARGE MEDICATIONS: Current Discharge Medication List    START taking these medications   Details  doxycycline (VIBRA-TABS) 100 MG tablet Take 1 tablet (100 mg total) by mouth 2 (two) times daily. X 3 weeks Qty: 42 tablet, Refills: 0    hydrOXYzine (ATARAX/VISTARIL) 25 MG tablet Take 1 tablet (25 mg total) by mouth 3 (three) times daily. Qty: 90 tablet, Refills: 0    nicotine (NICODERM CQ - DOSED IN MG/24 HOURS) 21 mg/24hr patch Place 1 patch (21 mg total) onto the skin daily. Qty: 28 patch, Refills: 0    oxyCODONE (OXY IR/ROXICODONE) 5 MG immediate release tablet Take 1 tablet (5 mg total) by mouth every 4 (four) hours as needed for moderate pain. Qty: 20 tablet, Refills: 0      CONTINUE these medications which have CHANGED   Details  albuterol (PROVENTIL HFA;VENTOLIN HFA) 108 (90 Base) MCG/ACT inhaler Inhale 1-2 puffs into the lungs every 6 (six) hours as needed for wheezing or  shortness of breath. Qty: 1 Inhaler, Refills: 2    gabapentin (NEURONTIN) 600 MG tablet Take 0.5 tablets (300 mg total) by mouth 3 (three) times daily. Qty: 60 tablet, Refills: 0      CONTINUE these medications which have NOT CHANGED   Details  hydrOXYzine (VISTARIL) 25 MG capsule Take 25 mg by mouth 3 (three) times daily.     ipratropium (ATROVENT) 0.02 % nebulizer solution Take 2.5 mLs (0.5 mg total) by nebulization 3 (three) times daily. Qty: 75 mL, Refills: 0    Iron Combinations (IRON COMPLEX PO) Take 1 tablet by mouth daily.    Multiple Vitamin (MULTIVITAMIN WITH MINERALS) TABS tablet Take 1 tablet by mouth daily.      STOP taking these medications     azithromycin (ZITHROMAX Z-PAK) 250 MG tablet      predniSONE (DELTASONE) 50 MG tablet          Brief H and P: For complete details please refer to admission H and P, but in brief Wendy Douglas a 40 y.o.femalewith medical history significant of COPD, polysubstance abuse/IVDU, presenting with left hand swelling pain in the redness. Started 1 day Prior to admission, the patient reported  attempted cocaine injection between the fourth and fifth fingers on the dorsum of the hand 3 days ago. States that this did not go into the vein directly. Symptoms are constant and getting worse. Denies fevers, chills, neck stiffness, headache, palpitations, chest pain, shortness of breath, nausea, vomiting, diarrhea, dysuria,  frequency, vaginal discharge, back pain.  Hospital Course:   Cellulitis and abscess of hand: Likely secondary to attempted cocaine injection in hand.  - MRI showed severe cellulitis of the dorsal aspect of the left hand with complex infiltrative abscess located both superficial and deep to the extensor digitorum tendons, largest abscess 2.2 x 8 x 0.8 centimeter and infectious tenosynovitis. - Hand surgery was consulted, appreciate Dr. Carlos Douglas's assistance. Patient underwentirrigation and debridement of the deep  abscess and radical tenosynovectomy on 9/12, subsequently she received daily hydrotherapy and wound care. She was followed closely by hand surgery. -  wound Cultures grew MRSA,   ID was consulted and patient was followed by Dr. Ninetta Douglas. Patient was transitioned to oral doxycycline at the time of discharge per recommendations, currently she is tolerating it. [facility] and other medications. -  patient was able to demonstrate wound care and dressing in front of the wound care/hydrotherapy nurse to ensure that she is able to take care of her dressings outpatient. She will be provided with medical supplies by the wound care/nursing staff. - She has an appointment with hand surgery on 9/21, Thursday at 8:15 AM.  Trichomonas: present on UA (contaminated sample). Suspect vaginal infection.  - Completed metronidazole, Patient was recommended to inform her husband for treatment as well  COPD: - continue albuterol As needed  Polysubstance use: Patient's UDS positive for opiates, cocaine and THC. 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 Currently not in withdrawals - Discussed in detail with the patient, she is reported that she was in a methadone program for 7 years and 6 months ago she quit and started using heroin. She is no longer on methadone. - She was given a prescription for oral oxycodone (#20, no refills), further refills by hand surgery or primary physician.   Nicotine abuse: - Patient smokes half pack per day. - Placed on nicotine patch   Depression/Anxiety: - continue vistaril  Day of Discharge BP 119/67 (BP Location: Right Arm)   Pulse 88   Temp 98 F (36.7 C) (Oral)   Resp 18   Ht 5\' 5"  (1.651 m)   Wt 56.7 kg (125 lb)   LMP 07/10/2016 (Approximate)   SpO2 100%   BMI 20.80 kg/m   Physical Exam: General: Alert and awake oriented x3 not in any acute distress. HEENT: anicteric sclera, pupils reactive to light and  accommodation CVS: S1-S2 clear no murmur rubs or gallops Chest: clear to auscultation bilaterally, no wheezing rales or rhonchi Abdomen: soft nontender, nondistended, normal bowel sounds Extremities: no cyanosis, clubbing or edema noted bilaterally. Left hand and arm and dressing  Neuro: Cranial nerves II-XII intact, no focal neurological deficits   The results of significant diagnostics from this hospitalization (including imaging, microbiology, ancillary and laboratory) are listed below for reference.    LAB RESULTS: Basic Metabolic Panel:  Recent Labs Lab 07/18/16 0657 07/19/16 0857  NA 141 137  K 4.3 3.7  CL 111 106  CO2 26 24  GLUCOSE 87 119*  BUN 9 11  CREATININE 0.72 0.65  CALCIUM 8.7* 8.5*   Liver Function Tests: No results for input(s): AST, ALT, ALKPHOS, BILITOT, PROT, ALBUMIN in the last 168 hours. No results for input(s): LIPASE, AMYLASE in the last 168 hours. No results for input(s): AMMONIA in the last 168 hours. CBC:  Recent Labs Lab 07/18/16 0657 07/19/16 0857  WBC 6.4 5.9  HGB 11.6* 11.1*  HCT 35.5* 34.2*  MCV 96.2 96.6  Charlesetta Ivoryeckey DowningArvella Merles aune PollackDevoria AlbeCornelius Morasyn Hay Commun[address] [facility] onnell FreshwaterSolon Palm6mCharlesetta Ivoryeckey DowningArvella Merles haune Pollack [id number] MorasDevoria AlbeSherlyn HayBdpec Asc Show Low Ronnell FreshwaterSolon Palm62mCharlesetta IvoryBeckey DowningArvella Merles Shaune Pollack [address]Cornelius MorasDevoria AlbeSherlyn HayWayne Medical Center Ronnell FreshwaterSolon Palm29mCharlesetta IvoryBeckey DowningArvella Merles Shaune Pollack [address]Cornelius MorasDevoria AlbeSherlyn HayWoodridge Behavioral Center Ronnell FreshwaterSolon Palm59mCharlesetta IvoryBeckey DowningArvella Merles Shaune Pollack  carpi radialis longus. These results were called by telephone at the time of interpretation on 07/15/2016 at 6:18 pm to Dr. Amanda Pea, who verbally acknowledged these results. Electronically Signed   By: Elige Ko   On: 07/15/2016 18:19   Dg Hand Complete Left  Result Date: 07/15/2016 CLINICAL DATA:  Left hand swelling that started a few days ago after injected hand with cocaine. For arm numbness and tingling. EXAM: LEFT HAND - COMPLETE 3+ VIEW COMPARISON:  None. FINDINGS: PA oblique and lateral views left hand. No acute displaced fracture or malalignment. Diffuse soft tissue swelling over the dorsum of the hand. No radiopaque foreign body or soft tissue gas collections. IMPRESSION: Marked swelling over the dorsum of the left hand. No acute underlying osseous abnormality. Electronically Signed   By: Jasmine Pang M.D.   On: 07/15/2016 11:57    2D ECHO:   Disposition and Follow-up: Discharge Instructions    Diet general    Complete by:  As directed     Increase activity slowly    Complete by:  As directed        DISPOSITION: Home   DISCHARGE FOLLOW-UP Follow-up Information    [facility]. Schedule an appointment as soon as possible for a visit in 1 week(s).   Contact information: [address] [place] [address] [phone]       BUCHANAN,BRIAN L, PA-C .   Specialty:  Orthopedic Surgery Why:  Thursday July 24, 2016 at 0815 am  Contact information: [address] [place] [address] [phone]            Time spent on Discharge:   Signed:   RAI,RIPUDEEP M.D. [facility] 07/22/2016, 1:29 PM Pager: [phone]

## 2016-08-14 LAB — FUNGUS CULTURE WITH STAIN

## 2016-08-14 LAB — FUNGUS CULTURE RESULT

## 2016-08-14 LAB — FUNGAL ORGANISM REFLEX

## 2016-09-02 LAB — ACID FAST CULTURE WITH REFLEXED SENSITIVITIES (MYCOBACTERIA)
Acid Fast Culture: NEGATIVE
Acid Fast Culture: NEGATIVE

## 2016-09-02 LAB — ACID FAST CULTURE WITH REFLEXED SENSITIVITIES

## 2017-04-13 ENCOUNTER — Encounter (HOSPITAL_COMMUNITY): Payer: Self-pay | Admitting: Emergency Medicine

## 2017-04-13 ENCOUNTER — Emergency Department (HOSPITAL_COMMUNITY)
Admission: EM | Admit: 2017-04-13 | Discharge: 2017-04-13 | Disposition: A | Discharge status: Home / Self Care | Payer: Self-pay | Attending: Emergency Medicine | Admitting: Emergency Medicine

## 2017-04-13 DIAGNOSIS — J449 Chronic obstructive pulmonary disease, unspecified: Secondary | ICD-10-CM | POA: Insufficient documentation

## 2017-04-13 DIAGNOSIS — M21332 Wrist drop, left wrist: Secondary | ICD-10-CM | POA: Insufficient documentation

## 2017-04-13 DIAGNOSIS — Z79899 Other long term (current) drug therapy: Secondary | ICD-10-CM | POA: Insufficient documentation

## 2017-04-13 DIAGNOSIS — J45909 Unspecified asthma, uncomplicated: Secondary | ICD-10-CM | POA: Insufficient documentation

## 2017-04-13 DIAGNOSIS — F17210 Nicotine dependence, cigarettes, uncomplicated: Secondary | ICD-10-CM | POA: Insufficient documentation
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
## 2017-04-13 NOTE — Discharge Instructions (Signed)
Your Care Instructions The radial nerve runs down the arm. It controls muscles in the back of the arm. It also helps with movement and feeling in the wrist and hand.  If you injure the back of your arm or pinch the nerve, you might have trouble moving your arm, wrist, or hand. You might also have pain, weakness, numbness, tingling, or trouble lifting your wrist or fingers. This can also happen if you fall asleep in a way that puts pressure on the nerve, such as with your arm hanging over a chair.  In most cases, no treatment is needed. You will slowly get more strength and feeling. This can take weeks or even months. Sometimes physiotherapy or occupational therapy is used to keep up muscle strength. You might also need other tests, such as nerve tests or an MRI. If you don't get better, or if the injury is more serious, surgery might be needed to fix the nerve or remove something pressing on it.  Follow-up care is a key part of your treatment and safety. Be sure to make and go to all appointments, and call your doctor or nurse call line if you are having problems. It's also a good idea to know your test results and keep a list of the medicines you take.

## 2017-04-13 NOTE — ED Triage Notes (Signed)
Pt to ED from home c/o inability to hyperextend L hand x ~24 hours. Pt reports surgery on hand/wrist 8-9 months ago in which an abscess secondary to IVDU was scraped out and cleaned of MRSA. Pt denies new injury to hand. Reports circulation and sensation intact. Swelling noted is normal according to patient.

## 2017-07-21 ENCOUNTER — Emergency Department (HOSPITAL_COMMUNITY)
Admission: EM | Admit: 2017-07-21 | Discharge: 2017-07-21 | Disposition: A | Discharge status: Home / Self Care | Payer: Self-pay | Attending: Emergency Medicine | Admitting: Emergency Medicine

## 2017-07-21 ENCOUNTER — Encounter (HOSPITAL_COMMUNITY): Payer: Self-pay | Admitting: *Deleted

## 2017-07-21 DIAGNOSIS — S61411A Laceration without foreign body of right hand, initial encounter: Secondary | ICD-10-CM | POA: Insufficient documentation

## 2017-07-21 DIAGNOSIS — J449 Chronic obstructive pulmonary disease, unspecified: Secondary | ICD-10-CM | POA: Insufficient documentation

## 2017-07-21 DIAGNOSIS — Z79899 Other long term (current) drug therapy: Secondary | ICD-10-CM | POA: Insufficient documentation

## 2017-07-21 DIAGNOSIS — W260XXA Contact with knife, initial encounter: Secondary | ICD-10-CM | POA: Insufficient documentation

## 2017-07-21 DIAGNOSIS — Y929 Unspecified place or not applicable: Secondary | ICD-10-CM | POA: Insufficient documentation

## 2017-07-21 DIAGNOSIS — Y9389 Activity, other specified: Secondary | ICD-10-CM | POA: Insufficient documentation

## 2017-07-21 DIAGNOSIS — J45909 Unspecified asthma, uncomplicated: Secondary | ICD-10-CM | POA: Insufficient documentation

## 2017-07-21 DIAGNOSIS — Y999 Unspecified external cause status: Secondary | ICD-10-CM | POA: Insufficient documentation

## 2017-07-21 DIAGNOSIS — F17210 Nicotine dependence, cigarettes, uncomplicated: Secondary | ICD-10-CM | POA: Insufficient documentation

## 2017-07-21 MED ORDER — TETANUS-DIPHTH-ACELL PERTUSSIS 5-2.5-18.5 LF-MCG/0.5 IM SUSP
0.5000 mL | Freq: Once | INTRAMUSCULAR | Status: AC
  Administered 2017-07-21: 0.5 mL via INTRAMUSCULAR
  Filled 2017-07-21: qty 0.5

## 2017-07-21 MED ORDER — LIDOCAINE-EPINEPHRINE (PF) 2 %-1:200000 IJ SOLN
10.0000 mL | Freq: Once | INTRAMUSCULAR | Status: AC
  Administered 2017-07-21: 10 mL
  Filled 2017-07-21: qty 20
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
## 2017-07-21 NOTE — ED Notes (Signed)
Fast track pt, provider bedside, see provider assessment

## 2017-09-03 ENCOUNTER — Emergency Department (HOSPITAL_COMMUNITY)
Admission: EM | Admit: 2017-09-03 | Discharge: 2017-09-03 | Disposition: A | Discharge status: Home / Self Care | Payer: Self-pay | Attending: Emergency Medicine | Admitting: Emergency Medicine

## 2017-09-03 ENCOUNTER — Emergency Department (HOSPITAL_COMMUNITY): Payer: Self-pay

## 2017-09-03 ENCOUNTER — Encounter (HOSPITAL_COMMUNITY): Payer: Self-pay | Admitting: Emergency Medicine

## 2017-09-03 DIAGNOSIS — F17210 Nicotine dependence, cigarettes, uncomplicated: Secondary | ICD-10-CM | POA: Insufficient documentation

## 2017-09-03 DIAGNOSIS — R911 Solitary pulmonary nodule: Secondary | ICD-10-CM | POA: Insufficient documentation

## 2017-09-03 DIAGNOSIS — J449 Chronic obstructive pulmonary disease, unspecified: Secondary | ICD-10-CM | POA: Insufficient documentation

## 2017-09-03 DIAGNOSIS — J209 Acute bronchitis, unspecified: Secondary | ICD-10-CM | POA: Insufficient documentation

## 2017-09-03 DIAGNOSIS — Z79899 Other long term (current) drug therapy: Secondary | ICD-10-CM | POA: Insufficient documentation

## 2017-09-03 MED ORDER — ALBUTEROL SULFATE HFA 108 (90 BASE) MCG/ACT IN AERS
2.0000 | INHALATION_SPRAY | RESPIRATORY_TRACT | Status: DC
  Filled 2017-09-03: qty 6.7

## 2017-09-03 MED ORDER — AMOXICILLIN 500 MG PO CAPS: 500.0000 mg | ORAL_CAPSULE | Freq: Three times a day (TID) | ORAL | 0 refills | Status: DC

## 2017-09-03 NOTE — Discharge Instructions (Signed)
You need a follow up Ct in 1 year.

## 2017-09-03 NOTE — ED Notes (Signed)
Patient given Mal[place]key sandwich, crackers, peanut butter, ginger ale and coke.

## 2017-09-03 NOTE — ED Triage Notes (Signed)
Pt to ED with c/o productive cough, congestion and shortness of breath x's 3-4 days.
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
## 2018-02-28 ENCOUNTER — Inpatient Hospital Stay (HOSPITAL_COMMUNITY)
Admission: EM | Admit: 2018-02-28 | Discharge: 2018-03-01 | DRG: 603 | Discharge status: Against Medical Advice | Payer: Self-pay | Attending: Internal Medicine | Admitting: Internal Medicine

## 2018-02-28 ENCOUNTER — Other Ambulatory Visit: Payer: Self-pay

## 2018-02-28 ENCOUNTER — Emergency Department (HOSPITAL_COMMUNITY): Payer: Self-pay

## 2018-02-28 ENCOUNTER — Encounter (HOSPITAL_COMMUNITY): Payer: Self-pay | Admitting: *Deleted

## 2018-02-28 DIAGNOSIS — J449 Chronic obstructive pulmonary disease, unspecified: Secondary | ICD-10-CM | POA: Diagnosis present

## 2018-02-28 DIAGNOSIS — F17210 Nicotine dependence, cigarettes, uncomplicated: Secondary | ICD-10-CM | POA: Diagnosis present

## 2018-02-28 DIAGNOSIS — R911 Solitary pulmonary nodule: Secondary | ICD-10-CM | POA: Diagnosis present

## 2018-02-28 DIAGNOSIS — Z590 Homelessness: Secondary | ICD-10-CM

## 2018-02-28 DIAGNOSIS — Z79899 Other long term (current) drug therapy: Secondary | ICD-10-CM

## 2018-02-28 DIAGNOSIS — B182 Chronic viral hepatitis C: Secondary | ICD-10-CM | POA: Diagnosis present

## 2018-02-28 DIAGNOSIS — Z882 Allergy status to sulfonamides status: Secondary | ICD-10-CM

## 2018-02-28 DIAGNOSIS — Z8659 Personal history of other mental and behavioral disorders: Secondary | ICD-10-CM

## 2018-02-28 DIAGNOSIS — F17200 Nicotine dependence, unspecified, uncomplicated: Secondary | ICD-10-CM | POA: Diagnosis present

## 2018-02-28 DIAGNOSIS — Z792 Long term (current) use of antibiotics: Secondary | ICD-10-CM

## 2018-02-28 DIAGNOSIS — F1910 Other psychoactive substance abuse, uncomplicated: Secondary | ICD-10-CM | POA: Diagnosis present

## 2018-02-28 DIAGNOSIS — Z8614 Personal history of Methicillin resistant Staphylococcus aureus infection: Secondary | ICD-10-CM

## 2018-02-28 DIAGNOSIS — Z91030 Bee allergy status: Secondary | ICD-10-CM

## 2018-02-28 DIAGNOSIS — W460XXA Contact with hypodermic needle, initial encounter: Secondary | ICD-10-CM | POA: Diagnosis present

## 2018-02-28 DIAGNOSIS — L0291 Cutaneous abscess, unspecified: Secondary | ICD-10-CM | POA: Diagnosis present

## 2018-02-28 DIAGNOSIS — Z5321 Procedure and treatment not carried out due to patient leaving prior to being seen by health care provider: Secondary | ICD-10-CM | POA: Diagnosis not present

## 2018-02-28 DIAGNOSIS — F32A Depression, unspecified: Secondary | ICD-10-CM | POA: Diagnosis present

## 2018-02-28 DIAGNOSIS — S51832A Puncture wound without foreign body of left forearm, initial encounter: Secondary | ICD-10-CM | POA: Diagnosis present

## 2018-02-28 DIAGNOSIS — F1120 Opioid dependence, uncomplicated: Secondary | ICD-10-CM | POA: Diagnosis present

## 2018-02-28 DIAGNOSIS — L02413 Cutaneous abscess of right upper limb: Principal | ICD-10-CM | POA: Diagnosis present

## 2018-02-28 DIAGNOSIS — F329 Major depressive disorder, single episode, unspecified: Secondary | ICD-10-CM | POA: Diagnosis present

## 2018-02-28 DIAGNOSIS — F419 Anxiety disorder, unspecified: Secondary | ICD-10-CM | POA: Diagnosis present

## 2018-02-28 HISTORY — DX: Cutaneous abscess, unspecified: L02.91

## 2018-02-28 HISTORY — DX: Methicillin resistant Staphylococcus aureus infection, unspecified site: A49.02

## 2018-02-28 LAB — COMPREHENSIVE METABOLIC PANEL
ALK PHOS: 110 U/L (ref 38–126)
ALT: 52 U/L (ref 14–54)
AST: 37 U/L (ref 15–41)
Albumin: 3.7 g/dL (ref 3.5–5.0)
Anion gap: 10 (ref 5–15)
BILIRUBIN TOTAL: 0.8 mg/dL (ref 0.3–1.2)
BUN: 5 mg/dL — AB (ref 6–20)
CALCIUM: 9.1 mg/dL (ref 8.9–10.3)
CO2: 24 mmol/L (ref 22–32)
CREATININE: 0.93 mg/dL (ref 0.44–1.00)
Chloride: 100 mmol/L — ABNORMAL LOW (ref 101–111)
GFR calc Af Amer: >60 mL/min (ref >60)
Glucose, Bld: 106 mg/dL — ABNORMAL HIGH (ref 65–99)
POTASSIUM: 3.7 mmol/L (ref 3.5–5.1)
Sodium: 134 mmol/L — ABNORMAL LOW (ref 135–145)
TOTAL PROTEIN: 7.9 g/dL (ref 6.5–8.1)

## 2018-02-28 LAB — CBC WITH DIFFERENTIAL/PLATELET
Basophils Absolute: 0 10*3/uL (ref 0.0–0.1)
Basophils Relative: 0 %
EOS ABS: 0.1 10*3/uL (ref 0.0–0.7)
Eosinophils Relative: 1 %
HEMATOCRIT: 38.3 % (ref 36.0–46.0)
HEMOGLOBIN: 12.8 g/dL (ref 12.0–15.0)
LYMPHS ABS: 1.5 10*3/uL (ref 0.7–4.0)
Lymphocytes Relative: 16 %
MCH: 31 pg (ref 26.0–34.0)
MCHC: 33.4 g/dL (ref 30.0–36.0)
MCV: 92.7 fL (ref 78.0–100.0)
Monocytes Absolute: 0.9 10*3/uL (ref 0.1–1.0)
Monocytes Relative: 9 %
NEUTROS ABS: 7.1 10*3/uL (ref 1.7–7.7)
NEUTROS PCT: 74 %
Platelets: 171 10*3/uL (ref 150–400)
RBC: 4.13 MIL/uL (ref 3.87–5.11)
RDW: 13.2 % (ref 11.5–15.5)
WBC: 9.6 10*3/uL (ref 4.0–10.5)

## 2018-02-28 LAB — I-STAT BETA HCG BLOOD, ED (MC, WL, AP ONLY)

## 2018-02-28 LAB — I-STAT CG4 LACTIC ACID, ED: Lactic Acid, Venous: 1.44 mmol/L (ref 0.5–1.9)

## 2018-02-28 NOTE — ED Triage Notes (Signed)
Pt is an IV drug user, reports missing vein a week ago, noticed redness and swelling to R forearm today. Also reports increased sob since redness has increased to arm

## 2018-03-01 ENCOUNTER — Encounter (HOSPITAL_COMMUNITY): Payer: Self-pay | Admitting: Internal Medicine

## 2018-03-01 ENCOUNTER — Other Ambulatory Visit: Payer: Self-pay

## 2018-03-01 DIAGNOSIS — L0291 Cutaneous abscess, unspecified: Secondary | ICD-10-CM | POA: Diagnosis present

## 2018-03-01 DIAGNOSIS — L02413 Cutaneous abscess of right upper limb: Principal | ICD-10-CM

## 2018-03-01 MED ORDER — DICYCLOMINE HCL 20 MG PO TABS
20.0000 mg | ORAL_TABLET | Freq: Four times a day (QID) | ORAL | Status: DC | PRN
  Administered 2018-03-01: 20 mg via ORAL
  Filled 2018-03-01: qty 1

## 2018-03-01 MED ORDER — LIDOCAINE HCL (PF) 1 % IJ SOLN
INTRAMUSCULAR | Status: AC
  Administered 2018-03-01: 30 mL
  Filled 2018-03-01: qty 30

## 2018-03-01 MED ORDER — ACETAMINOPHEN 325 MG PO TABS
650.0000 mg | ORAL_TABLET | Freq: Four times a day (QID) | ORAL | Status: DC | PRN
  Administered 2018-03-01: 650 mg via ORAL
  Filled 2018-03-01: qty 2

## 2018-03-01 MED ORDER — TRAZODONE HCL 50 MG PO TABS: 25.0000 mg | ORAL_TABLET | Freq: Every evening | ORAL | Status: DC | PRN

## 2018-03-01 MED ORDER — CLONIDINE HCL 0.1 MG PO TABS
0.1000 mg | ORAL_TABLET | Freq: Three times a day (TID) | ORAL | Status: DC
  Administered 2018-03-01 (×2): 0.1 mg via ORAL
  Filled 2018-03-01 (×2): qty 1

## 2018-03-01 MED ORDER — CLONIDINE HCL 0.1 MG PO TABS: 0.1000 mg | ORAL_TABLET | ORAL | Status: DC

## 2018-03-01 MED ORDER — CLONIDINE HCL 0.1 MG PO TABS: 0.1000 mg | ORAL_TABLET | Freq: Every day | ORAL | Status: DC

## 2018-03-01 MED ORDER — ALBUTEROL SULFATE (2.5 MG/3ML) 0.083% IN NEBU
2.5000 mg | INHALATION_SOLUTION | Freq: Four times a day (QID) | RESPIRATORY_TRACT | Status: DC
  Filled 2018-03-01: qty 3

## 2018-03-01 MED ORDER — HYDROXYZINE HCL 25 MG PO TABS
25.0000 mg | ORAL_TABLET | Freq: Four times a day (QID) | ORAL | Status: DC | PRN
  Administered 2018-03-01: 25 mg via ORAL
  Filled 2018-03-01: qty 1

## 2018-03-01 MED ORDER — ALBUTEROL SULFATE (2.5 MG/3ML) 0.083% IN NEBU: 2.5000 mg | INHALATION_SOLUTION | Freq: Four times a day (QID) | RESPIRATORY_TRACT | Status: DC | PRN

## 2018-03-01 MED ORDER — LOPERAMIDE HCL 2 MG PO CAPS: 2.0000 mg | ORAL_CAPSULE | ORAL | Status: DC | PRN

## 2018-03-01 MED ORDER — METHOCARBAMOL 500 MG PO TABS
500.0000 mg | ORAL_TABLET | Freq: Three times a day (TID) | ORAL | Status: DC | PRN
  Administered 2018-03-01: 500 mg via ORAL
  Filled 2018-03-01: qty 1

## 2018-03-01 MED ORDER — CEFAZOLIN SODIUM-DEXTROSE 2-4 GM/100ML-% IV SOLN
2.0000 g | Freq: Three times a day (TID) | INTRAVENOUS | Status: DC
  Administered 2018-03-01: 2 g via INTRAVENOUS
  Filled 2018-03-01: qty 100

## 2018-03-01 MED ORDER — KETOROLAC TROMETHAMINE 30 MG/ML IJ SOLN
30.0000 mg | Freq: Four times a day (QID) | INTRAMUSCULAR | Status: DC | PRN
  Administered 2018-03-01: 30 mg via INTRAVENOUS
  Filled 2018-03-01: qty 1

## 2018-03-01 MED ORDER — ONDANSETRON HCL 4 MG/2ML IJ SOLN: 4.0000 mg | Freq: Four times a day (QID) | INTRAMUSCULAR | Status: DC | PRN

## 2018-03-01 MED ORDER — CLINDAMYCIN PHOSPHATE 600 MG/50ML IV SOLN
600.0000 mg | Freq: Once | INTRAVENOUS | Status: AC
  Administered 2018-03-01: 600 mg via INTRAVENOUS
  Filled 2018-03-01: qty 50

## 2018-03-01 MED ORDER — ONDANSETRON HCL 4 MG PO TABS: 4.0000 mg | ORAL_TABLET | Freq: Four times a day (QID) | ORAL | Status: DC | PRN

## 2018-03-01 MED ORDER — ACETAMINOPHEN 650 MG RE SUPP: 650.0000 mg | Freq: Four times a day (QID) | RECTAL | Status: DC | PRN

## 2018-03-01 MED ORDER — CLINDAMYCIN PHOSPHATE 600 MG/50ML IV SOLN: 600.0000 mg | Freq: Three times a day (TID) | INTRAVENOUS | Status: DC

## 2018-03-01 MED ORDER — SENNOSIDES-DOCUSATE SODIUM 8.6-50 MG PO TABS: 1.0000 | ORAL_TABLET | Freq: Every evening | ORAL | Status: DC | PRN

## 2018-03-01 MED ORDER — ALBUTEROL SULFATE (2.5 MG/3ML) 0.083% IN NEBU
2.5000 mg | INHALATION_SOLUTION | Freq: Once | RESPIRATORY_TRACT | Status: AC
  Administered 2018-03-01: 2.5 mg via RESPIRATORY_TRACT
  Filled 2018-03-01: qty 3

## 2018-03-01 MED ORDER — MORPHINE SULFATE (PF) 4 MG/ML IV SOLN
4.0000 mg | Freq: Once | INTRAVENOUS | Status: AC | PRN
  Administered 2018-03-01: 4 mg via INTRAVENOUS
  Filled 2018-03-01: qty 1

## 2018-03-01 MED ORDER — LORAZEPAM 1 MG PO TABS
0.5000 mg | ORAL_TABLET | Freq: Once | ORAL | Status: AC | PRN
  Administered 2018-03-01: 0.5 mg via ORAL
  Filled 2018-03-01: qty 1

## 2018-03-01 NOTE — H&P (Signed)
History and Physical    Wendy Douglas ZDG:[id number] DOB: 02-04-1976 DOA: 02/28/2018  PCP: Patient, No Pcp Per Patient coming from: home  Chief Complaint: right arm swelling  HPI: Wendy Douglas is a very pleasant 42 y.o. female with medical history significant for COPD, bipolar disorder, IV drug use presents to the emergency Department chief complaint of persistent worsening swelling of her right arm. Initial evaluation for cellulitis with concern for abscess. [facility] are asked to admit  Information is obtained from the patient. She reports approximately a week ago she developed redness swellingin her right arm after trying to inject heroin. She believes she missed a vein. She states the arm persistently became red or hot or more painful. The redness and swelling travel down to her hand. Associated symptoms include nausea without vomiting subjective fevers. He also has been experiencing some cough and shortness of breath. She denies headache dizziness syncope or near-syncope. She denies abdominal pain dysuria hematuria frequency or urgency. She denies diarrhea constipation melena bright red blood per rectum.    ED Course: in the emergency department max temperature is 100 she is mildly tachycardic otherwise hemodynamically stable and not hypoxic. She underwent an incision and draining. And Cleocin IV was started.  Review of Systems: As per HPI otherwise all other systems reviewed and are negative.   Ambulatory Status:ambulates independently is independent with ADLs  Past Medical History:  Diagnosis Date  . Abscess   . Anxiety   . Asthma    IN CHILDHOOD  . Bipolar disorder (HCC)   . Chronic narcotic dependence (HCC)   . COPD (chronic obstructive pulmonary disease) (HCC)   . Depression   . IVDU (intravenous drug user)   . MRSA (methicillin resistant Staphylococcus aureus)   . Pneumonia "several times"   (07/15/2016)  . Shortness of breath   . Smoker     Past Surgical  History:  Procedure Laterality Date  . I&D EXTREMITY Left 07/15/2016   Procedure: IRRIGATION AND DEBRIDEMENT LEFT HAND WITH TENOSYNOVECTOMY;  Surgeon: Dominica Severin, MD;  Location: [facility] OR;  Service: Orthopedics;  Laterality: Left;  . INCISION AND DRAINAGE ABSCESS Left 07/15/2016   between the fourth and fifth fingers on the dorsum of the hand   . LAPAROSCOPIC CHOLECYSTECTOMY    . TUBAL LIGATION      Social History   Socioeconomic History  . Marital status: Divorced    Spouse name: Not on file  . Number of children: Not on file  . Years of education: Not on file  . Highest education level: Not on file  Occupational History  . Not on file  Social Needs  . Financial resource strain: Not on file  . Food insecurity:    Worry: Not on file    Inability: Not on file  . Transportation needs:    Medical: Not on file    Non-medical: Not on file  Tobacco Use  . Smoking status: Current Every Day Smoker    Packs/day: 0.50    Years: 25.00    Pack years: 12.50    Types: Cigarettes  . Smokeless tobacco: Never Used  Substance and Sexual Activity  . Alcohol use: Yes    Alcohol/week: 3.6 oz    Types: 6 Cans of beer per week  . Drug use: Yes    Types: IV, Cocaine, Marijuana    Comment: heroin and crack; 07/15/2016 "use cocaine qd; smoke marijuana occasionally"  . Sexual activity: Not Currently  Lifestyle  . Physical activity:  Days per week: Not on file    Minutes per session: Not on file  . Stress: Not on file  Relationships  . Social connections:    Talks on phone: Not on file    Gets together: Not on file    Attends religious service: Not on file    Active member of club or organization: Not on file    Attends meetings of clubs or organizations: Not on file    Relationship status: Not on file  . Intimate partner violence:    Fear of current or ex partner: Not on file    Emotionally abused: Not on file    Physically abused: Not on file    Forced sexual activity: Not on file    Other Topics Concern  . Not on file  Social History Narrative  . Not on file    Allergies  Allergen Reactions  . Bee Venom Shortness Of Breath and Swelling  . Sulfa Antibiotics Rash    Family History  Problem Relation Age of Onset  . Multiple sclerosis Mother   . Other Father        Suicide    Prior to Admission medications   Medication Sig Start Date End Date Taking? Authorizing Provider  albuterol (PROVENTIL HFA;VENTOLIN HFA) 108 (90 Base) MCG/ACT inhaler Inhale 1-2 puffs into the lungs every 6 (six) hours as needed for wheezing or shortness of breath. 07/22/16  Yes Rai, Delene Ruffini, MD    Physical Exam: Vitals:   02/28/18 2157 03/01/18 0110 03/01/18 0615  BP: 135/85 (!) 130/98 111/69  Pulse: 91 89 64  Resp: Temp: 100 F (37.8 C) 99.4 F (37.4 C) 98.6 F (37 C)  TempSrc: Oral  Oral  SpO2: 98% 100% 100%     General:  Appears calm and comfortable in no acute distress Eyes:  PERRL, EOMI, normal lids, iris ENT:  grossly normal hearing, lips & tongue, mucous membranes of her mouth are moist and pink Neck:  no LAD, masses or thyromegaly Cardiovascular:  RRR, no m/r/g. No LE edema.  Respiratory:  CTA bilaterally, no w/r/r. Normal respiratory effort. Abdomen:  soft, ntnd, positive bowel sounds throughout no guarding or rebounding Skin:  no rash or induration seen on limited exam Musculoskeletal:  grossly normal tone BUE/BLE, good ROM, no bony abnormality ight arm with dressing that she dry and intact. Wrist and hand remain swollen with erythema and heat. Fingers of right hand with full range of motion Psychiatric:  grossly normal mood and affect, speech fluent and appropriate, AOx3 Neurologic:  CN 2-12 grossly intact, moves all extremities in coordinated fashion, sensation intact  Labs on Admission: I have personally reviewed following labs and imaging studies  CBC: Recent Labs  Lab 02/28/18 2223  WBC 9.6  NEUTROABS 7.1  HGB 12.8  HCT 38.3  MCV 92.7   PLT 171   Basic Metabolic Panel: Recent Labs  Lab 02/28/18 2223  NA 134*  K 3.7  CL 100*  CO2 24  GLUCOSE 106*  BUN 5*  CREATININE 0.93  CALCIUM 9.1   GFR: CrCl cannot be calculated (Unknown ideal weight.). Liver Function Tests: Recent Labs  Lab 02/28/18 2223  AST 37  ALT 52  ALKPHOS 110  BILITOT 0.8  PROT 7.9  ALBUMIN 3.7   No results for input(s): LIPASE, AMYLASE in the last 168 hours. No results for input(s): AMMONIA in the last 168 hours. Coagulation Profile: No results for input(s): INR, PROTIME in the last 168  hours. Cardiac Enzymes: No results for input(s): CKTOTAL, CKMB, CKMBINDEX, TROPONINI in the last 168 hours. BNP (last 3 results) No results for input(s): PROBNP in the last 8760 hours. HbA1C: No results for input(s): HGBA1C in the last 72 hours. CBG: No results for input(s): GLUCAP in the last 168 hours. Lipid Profile: No results for input(s): CHOL, HDL, LDLCALC, TRIG, CHOLHDL, LDLDIRECT in the last 72 hours. Thyroid Function Tests: No results for input(s): TSH, T4TOTAL, FREET4, T3FREE, THYROIDAB in the last 72 hours. Anemia Panel: No results for input(s): VITAMINB12, FOLATE, FERRITIN, TIBC, IRON, RETICCTPCT in the last 72 hours. Urine analysis:    Component Value Date/Time   COLORURINE COLORLESS (A) 07/15/2016 1020   APPEARANCEUR CLEAR 07/15/2016 1020   LABSPEC <1.005 (L) 07/15/2016 1020   PHURINE 6.0 07/15/2016 1020   GLUCOSEU NEGATIVE 07/15/2016 1020   HGBUR TRACE (A) 07/15/2016 1020   BILIRUBINUR NEGATIVE 07/15/2016 1020   KETONESUR NEGATIVE 07/15/2016 1020   PROTEINUR NEGATIVE 07/15/2016 1020   UROBILINOGEN 0.2 03/22/2010 1131   NITRITE NEGATIVE 07/15/2016 1020   LEUKOCYTESUR LARGE (A) 07/15/2016 1020    Creatinine Clearance: CrCl cannot be calculated (Unknown ideal weight.).  Sepsis Labs: (procalcitonin:4,lacticidven:4) ) Recent Results (from the past 240 hour(s))  Wound or Superficial Culture     Status: None  (Preliminary result)   Collection Time: 03/01/18  4:55 AM  Result Value Ref Range Status   Specimen Description ABSCESS BLOOD RIGHT FOREARM  Final   Special Requests NONE  Final   Gram Stain   Final    RARE WBC PRESENT,BOTH PMN AND MONONUCLEAR FEW GRAM POSITIVE COCCI Performed at [facility], [address] [place], [place] [address]    Culture PENDING  Incomplete   Report Status PENDING  Incomplete     Radiological Exams on Admission: Dg Chest 2 View  Result Date: 02/28/2018 CLINICAL DATA:  IV drug abuser with abscess in the right forearm. EXAM: CHEST - 2 VIEW COMPARISON:  Chest CT and CXR from 09/03/2017 FINDINGS: Hyperinflated lungs. Tiny granuloma versus pulmonary vessel seen on end at the left lung apex. Heart and mediastinal contours are normal. Nipple piercings bilaterally. No acute osseous abnormality. Cholecystectomy clips in the right upper quadrant of the abdomen. IMPRESSION: Hyperinflated lungs without active pulmonary disease. Electronically Signed   By: Tollie Eth M.D.   On: 02/28/2018 23:15   Dg Forearm Right  Result Date: 02/28/2018 CLINICAL DATA:  Soft tissue abscess after missing vein 2 days ago. IVDA. EXAM: RIGHT FOREARM - 2 VIEW COMPARISON:  None. FINDINGS: Diffuse soft tissue cellulitis with soft tissue induration and swelling of the right forearm. More focal dorsal and radial soft tissue tissue swelling is seen in the mid forearm spanning 3.5 cm in length. No subcutaneous emphysema. No acute osseous abnormality. No evidence of foreign body, fracture or bone destruction. IMPRESSION: Forearm cellulitis with more focal soft tissue swelling along the dorsal lateral aspect of the mid forearm. Electronically Signed   By: Tollie Eth M.D.   On: 02/28/2018 23:17    EKG: Normal sinus rhythm Minimal voltage criteria for LVH, may be normal variant Borderline ECG  Assessment/Plan Principal Problem:   Abscess of forearm, right Active Problems:   Chronic narcotic  dependence (HCC)   Smoker   Depression   COPD   Chronic hepatitis C without hepatic coma (HCC)   Abscess   #1. Abscess of right forearm likely related to IV drug use. Max temp 100. Lactic acid within the limits of normal. No  leukocytosis.Patient underwent incision and draining in the ED. Hand surgeon consulted. Some concern for streaking above the wound. Cleocin IV initiated -Admit -Continue IV Cleocin -follow blood cultures -track lactic acid -monitor  #2. Chronic narcotic dependence/polysubstance abuse. Patient admits to heroin and crack usage IV. Last heroin done yesterday. He was initially concerned about being admitted as she is anxious about going into withdrawals. No signs and symptoms of withdrawals on admission -clonidine withdrawal protocol -social work  #3. COPD.Chest x-ray as noted above. Oxygen saturation level greater than 90% on room air. Not on home oxygen. -Continue nebulizers as needed -Monitor  #4. Chronic hepatitis C. Liver function tests within the limits of normalappears stable at baseline  #5. Tobacco use -Cessation counseling offered  DVT prophylaxis: lovenox Code Status: full  Family Communication: none  Disposition Plan: home hopefully 24-36 hours  Consults called: ED spoke with Dr Jena Gauss Admission status: inpatient    Gwenyth Bender MD [facility]  If 7PM-7AM, please contact night-coverage [email or web address] Password [email or web address]  03/01/2018, 8:11 AM

## 2018-03-01 NOTE — Consult Note (Signed)
Reason for Consult:Right forearm abscess Referring Physician: Beverlyann Douglas is an 42 y.o. female.  HPI: Wendy Douglas has been suffering with a right forearm infection for about a week. On Saturday the pain and swelling got much worse and she came to the ED for evaluation. She underwent I&D w/cultures by the EDP and hand surgery was consulted to aid in management. She was admitted by [facility]. She is as active IVDU with heroin and cocaine. She is RHD.  Past Medical History:  Diagnosis Date  . Abscess   . Anxiety   . Asthma    IN CHILDHOOD  . Bipolar disorder ([place])   . Chronic narcotic dependence ([place])   . COPD (chronic obstructive pulmonary disease) ([place])   . Depression   . IVDU (intravenous drug user)   . MRSA (methicillin resistant Staphylococcus aureus)   . Pneumonia "several times"   (07/15/2016)  . Shortness of breath   . Smoker     Past Surgical History:  Procedure Laterality Date  . I&D EXTREMITY Left 07/15/2016   Procedure: IRRIGATION AND DEBRIDEMENT LEFT HAND WITH TENOSYNOVECTOMY;  Surgeon: Roseanne Kaufman, MD;  Location: [facility];  Service: Orthopedics;  Laterality: Left;  . INCISION AND DRAINAGE ABSCESS Left 07/15/2016   between the fourth and fifth fingers on the dorsum of the hand   . LAPAROSCOPIC CHOLECYSTECTOMY    . TUBAL LIGATION      Family History  Problem Relation Age of Onset  . Multiple sclerosis Mother   . Other Father        Suicide    Social History:  reports that she has been smoking cigarettes.  She has a 12.50 pack-year smoking history. She has never used smokeless tobacco. She reports that she drinks about 3.6 oz of alcohol per week. She reports that she has current or past drug history. Drugs: IV, Cocaine, and Marijuana.  Allergies:  Allergies  Allergen Reactions  . Bee Venom Shortness Of Breath and Swelling  . Sulfa Antibiotics Rash    Medications: I have reviewed the patient's current medications.  Results for orders placed or performed during  the hospital encounter of 02/28/18 (from the past 48 hour(s))  Comprehensive metabolic panel     Status: Abnormal   Collection Time: 02/28/18 10:23 PM  Result Value Ref Range   Sodium 134 (L) 135 - 145 mmol/L   Potassium 3.7 3.5 - 5.1 mmol/L   Chloride 100 (L) 101 - 111 mmol/L   CO2 24 22 - 32 mmol/L   Glucose, Bld 106 (H) 65 - 99 mg/dL   BUN 5 (L) 6 - 20 mg/dL   Creatinine, Ser 0.93 0.44 - 1.00 mg/dL   Calcium 9.1 8.9 - 10.3 mg/dL   Total Protein 7.9 6.5 - 8.1 g/dL   Albumin 3.7 3.5 - 5.0 g/dL   AST 37 15 - 41 U/L   ALT 52 14 - 54 U/L   Alkaline Phosphatase 110 38 - 126 U/L   Total Bilirubin 0.8 0.3 - 1.2 mg/dL   GFR calc non Af Amer >60 >60 mL/min   GFR calc Af Amer >60 >60 mL/min    Comment: (NOTE) The eGFR has been calculated using the CKD EPI equation. This calculation has not been validated in all clinical situations. eGFR's persistently <60 mL/min signify possible Chronic Kidney Disease.    Anion gap 10 5 - 15    Comment: Performed at [facility] [address] [place],  [address]  CBC with Differential  Status: None   Collection Time: 02/28/18 10:23 PM  Result Value Ref Range   WBC 9.6 4.0 - 10.5 K/uL   RBC 4.13 3.87 - 5.11 MIL/uL   Hemoglobin 12.8 12.0 - 15.0 g/dL   HCT 38.3 36.0 - 46.0 %   MCV 92.7 78.0 - 100.0 fL   MCH 31.0 26.0 - 34.0 pg   MCHC 33.4 30.0 - 36.0 g/dL   RDW 13.2 11.5 - 15.5 %   Platelets 171 150 - 400 K/uL   Neutrophils Relative % 74 %   Neutro Abs 7.1 1.7 - 7.7 K/uL   Lymphocytes Relative 16 %   Lymphs Abs 1.5 0.7 - 4.0 K/uL   Monocytes Relative 9 %   Monocytes Absolute 0.9 0.1 - 1.0 K/uL   Eosinophils Relative 1 %   Eosinophils Absolute 0.1 0.0 - 0.7 K/uL   Basophils Relative 0 %   Basophils Absolute 0.0 0.0 - 0.1 K/uL    Comment: Performed at [facility] [address], [place], [place] [address]  I-Stat beta hCG blood, ED     Status: None   Collection Time: 02/28/18 10:38 PM  Result Value Ref Range    I-stat hCG, quantitative <5.0 <5 mIU/mL   Comment 3            Comment:   GEST. AGE      CONC.  (mIU/mL)   <=1 WEEK        5 - 50     2 WEEKS       50 - 500     3 WEEKS       100 - 10,000     4 WEEKS     1,000 - 30,000        FEMALE AND NON-PREGNANT FEMALE:     LESS THAN 5 mIU/mL   I-Stat CG4 Lactic Acid, ED     Status: None   Collection Time: 02/28/18 10:43 PM  Result Value Ref Range   Lactic Acid, Venous 1.44 0.5 - 1.9 mmol/L  Wound or Superficial Culture     Status: None (Preliminary result)   Collection Time: 03/01/18  4:55 AM  Result Value Ref Range   Specimen Description ABSCESS BLOOD RIGHT FOREARM    Special Requests NONE    Gram Stain      RARE WBC PRESENT,BOTH PMN AND MONONUCLEAR FEW GRAM POSITIVE COCCI Performed at [facility], [place] [address] [place], [place] [address]    Culture PENDING    Report Status PENDING     Dg Chest 2 View  Result Date: 02/28/2018 CLINICAL DATA:  IV drug abuser with abscess in the right forearm. EXAM: CHEST - 2 VIEW COMPARISON:  Chest CT and CXR from 09/03/2017 FINDINGS: Hyperinflated lungs. Tiny granuloma versus pulmonary vessel seen on end at the left lung apex. Heart and mediastinal contours are normal. Nipple piercings bilaterally. No acute osseous abnormality. Cholecystectomy clips in the right upper quadrant of the abdomen. IMPRESSION: Hyperinflated lungs without active pulmonary disease. Electronically Signed   By: Ashley Royalty M.D.   On: 02/28/2018 23:15   Dg Forearm Right  Result Date: 02/28/2018 CLINICAL DATA:  Soft tissue abscess after missing vein 2 days ago. IVDA. EXAM: RIGHT FOREARM - 2 VIEW COMPARISON:  None. FINDINGS: Diffuse soft tissue cellulitis with soft tissue induration and swelling of the right forearm. More focal dorsal and radial soft tissue tissue swelling is seen in the mid forearm spanning 3.5 cm in length. No subcutaneous  emphysema. No acute osseous abnormality. No evidence of foreign body, fracture or bone  destruction. IMPRESSION: Forearm cellulitis with more focal soft tissue swelling along the dorsal lateral aspect of the mid forearm. Electronically Signed   By: Ashley Royalty M.D.   On: 02/28/2018 23:17    Review of Systems  Constitutional: Negative for weight loss.  HENT: Negative for ear discharge, ear pain, hearing loss and tinnitus.   Eyes: Negative for blurred vision, double vision, photophobia and pain.  Respiratory: Negative for cough, sputum production and shortness of breath.   Cardiovascular: Negative for chest pain.  Gastrointestinal: Negative for abdominal pain, nausea and vomiting.  Genitourinary: Negative for dysuria, flank pain, frequency and urgency.  Musculoskeletal: Positive for joint pain (Right forearm). Negative for back pain, falls, myalgias and neck pain.  Neurological: Negative for dizziness, tingling, sensory change, focal weakness, loss of consciousness and headaches.  Endo/Heme/Allergies: Does not bruise/bleed easily.  Psychiatric/Behavioral: Negative for depression, memory loss and substance abuse. The patient is not nervous/anxious.    Blood pressure 111/69, pulse 64, temperature 98.6 F (37 C), temperature source Oral, resp. rate 18, SpO2 100 %. Physical Exam  Constitutional: She appears well-developed and well-nourished. No distress.  HENT:  Head: Normocephalic and atraumatic.  Eyes: Conjunctivae are normal. Right eye exhibits no discharge. Left eye exhibits no discharge. No scleral icterus.  Neck: Normal range of motion.  Cardiovascular: Normal rate and regular rhythm.  Respiratory: Effort normal. No respiratory distress.  Musculoskeletal:  Right shoulder, elbow, wrist, digits- Forearm dressed (did not take down), no instability, no blocks to motion  Sens  Ax/R/M/U intact  Mot   Ax/ R/ PIN/ M/ AIN/ U intact  Rad 2+  Neurological: She is alert.  Skin: Skin is warm and dry. She is not diaphoretic.  Psychiatric: She has a normal mood and affect. Her  behavior is normal.    Assessment/Plan: Right forearm abscess -- s/p I&D. That plus IV abx should be curative. Will follow along in chart to make sure pt is progressing. IVDU -- Counseled cessation Asthma Tobacco use -- Counseled cessation    Lisette Abu, PA-C Orthopedic Surgery [phone] 03/01/2018, 8:08 AM

## 2018-03-01 NOTE — ED Notes (Signed)
Pt requested coffee and graham crackers. Ok per [employer]. Pt given the same

## 2018-03-01 NOTE — ED Notes (Signed)
Pt asked for 2 sodas, I took pt the drinks and 1 cup of ice, at that time I heard a Female voice coming from the bathroom. I know Pt. Has history of drug use. I Left and went back and fixed a cup of ice for pt guest and when I brought it back pt guest hurried in bathroom and shut door. I came back about 5 min later and pt and guest were both in bathroom. I went back and informed Nurse Steward Drone that pt. And guest both were in the bathroom. Steward Drone and I than went back to the pt room and opened bathroom door. Pt and guest had needles on the floor, a yellow powder substance in a bag, a cap, and Guest was sitting on the toilet and pt. Was standing at the door. At that time Steward Drone told the guest he had to leave. I ask other staff to call security. Pt. Was saying get out she needed to get dressed, she than came out the bathroom and shut the door while her Guest was in the bathroom flushing toilet.

## 2018-03-01 NOTE — ED Notes (Addendum)
Pt extremely agitated about waiting in the hallway. Pt stated, "There is too much going on here!" Pt using profanity and apologetic for agitation. Informed Brooke - Charity fundraiser.

## 2018-03-01 NOTE — ED Notes (Signed)
Pt refused to sign ama.  Noted to be in bathroom with boyfriend.  Syringes found on ground.  Security called and pt and boyfriend escorted off.
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
## 2018-03-01 NOTE — ED Notes (Addendum)
Pt ambulated to bathroom without assistance 

## 2018-03-01 NOTE — ED Notes (Signed)
Pt diaphoretic, angry that she is not receiving morphine, accidentally pulled IV out.  Pt states she is leaving.  NP notified.

## 2018-03-01 NOTE — ED Notes (Signed)
Dinner tray ordered.

## 2018-03-01 NOTE — ED Notes (Addendum)
Security and [employer] called to room due to visitor and pt attempting to shoot up in the bathroom

## 2018-03-01 NOTE — ED Notes (Signed)
Pt stating she was told by pa that she would receive morphine to help with withdrawal from heron.  NP paged and responded and stated pt was never promised morphine and is already on the protocol for opiate withdrawal.

## 2018-03-03 LAB — AEROBIC CULTURE W GRAM STAIN (SUPERFICIAL SPECIMEN)

## 2018-03-03 LAB — AEROBIC CULTURE  (SUPERFICIAL SPECIMEN)

## 2018-03-04 ENCOUNTER — Telehealth: Payer: Self-pay | Admitting: *Deleted

## 2018-03-04 NOTE — Telephone Encounter (Signed)
Post ED Visit - Positive Culture Follow-up: Unsuccessful Patient Follow-up  Culture assessed and recommendations reviewed by:   Enzo Bi, Pharm.D.  Celedonio Miyamoto, Pharm.D., BCPS AQ-ID  Garvin Fila, Pharm.D., BCPS  Georgina Pillion, Pharm.D., BCPS  [place], [address].D., BCPS, AAHIVP  Estella Husk, Pharm.D., BCPS, AAHIVP  Sherlynn Carbon, PharmD  Pollyann Samples, PharmD, BCPS  Positive wound culture, reviewed by Leonia Corona, PA-C  Patient discharged without antimicrobial prescription and treatment is now indicated/Amoxicillin  PO BID x 7 days  Organism is resistant to prescribed ED discharge antimicrobial  Patient with positive blood cultures   Unable to contact patient after 3 attempts, letter will be sent to address on file  Lysle Pearl 03/04/2018, 10:56 AM

## 2018-03-06 LAB — CULTURE, BLOOD (ROUTINE X 2)
CULTURE: NO GROWTH
CULTURE: NO GROWTH

## 2018-04-13 ENCOUNTER — Emergency Department (HOSPITAL_COMMUNITY)
Admission: EM | Admit: 2018-04-13 | Discharge: 2018-04-13 | Disposition: A | Discharge status: Home / Self Care | Payer: Self-pay | Attending: Emergency Medicine | Admitting: Emergency Medicine

## 2018-04-13 ENCOUNTER — Encounter (HOSPITAL_COMMUNITY): Payer: Self-pay

## 2018-04-13 ENCOUNTER — Emergency Department (HOSPITAL_COMMUNITY): Payer: Self-pay

## 2018-04-13 DIAGNOSIS — S91331A Puncture wound without foreign body, right foot, initial encounter: Secondary | ICD-10-CM | POA: Insufficient documentation

## 2018-04-13 DIAGNOSIS — F17210 Nicotine dependence, cigarettes, uncomplicated: Secondary | ICD-10-CM | POA: Insufficient documentation

## 2018-04-13 DIAGNOSIS — X58XXXA Exposure to other specified factors, initial encounter: Secondary | ICD-10-CM | POA: Insufficient documentation

## 2018-04-13 DIAGNOSIS — T148XXA Other injury of unspecified body region, initial encounter: Secondary | ICD-10-CM

## 2018-04-13 DIAGNOSIS — Y929 Unspecified place or not applicable: Secondary | ICD-10-CM | POA: Insufficient documentation

## 2018-04-13 DIAGNOSIS — J45909 Unspecified asthma, uncomplicated: Secondary | ICD-10-CM | POA: Insufficient documentation

## 2018-04-13 DIAGNOSIS — Y999 Unspecified external cause status: Secondary | ICD-10-CM | POA: Insufficient documentation

## 2018-04-13 DIAGNOSIS — J449 Chronic obstructive pulmonary disease, unspecified: Secondary | ICD-10-CM | POA: Insufficient documentation

## 2018-04-13 DIAGNOSIS — Z79899 Other long term (current) drug therapy: Secondary | ICD-10-CM | POA: Insufficient documentation

## 2018-04-13 DIAGNOSIS — Y9301 Activity, walking, marching and hiking: Secondary | ICD-10-CM | POA: Insufficient documentation

## 2018-04-13 LAB — CBC WITH DIFFERENTIAL/PLATELET
BASOS ABS: 0 10*3/uL (ref 0.0–0.1)
Basophils Relative: 0 %
EOS ABS: 0 10*3/uL (ref 0.0–0.7)
Eosinophils Relative: 0 %
HCT: 41.6 % (ref 36.0–46.0)
HEMOGLOBIN: 14 g/dL (ref 12.0–15.0)
LYMPHS ABS: 1.6 10*3/uL (ref 0.7–4.0)
Lymphocytes Relative: 15 %
MCH: 31.8 pg (ref 26.0–34.0)
MCHC: 33.7 g/dL (ref 30.0–36.0)
MCV: 94.5 fL (ref 78.0–100.0)
Monocytes Absolute: 0.7 10*3/uL (ref 0.1–1.0)
Monocytes Relative: 7 %
NEUTROS PCT: 78 %
Neutro Abs: 8.3 10*3/uL — ABNORMAL HIGH (ref 1.7–7.7)
Platelets: 208 10*3/uL (ref 150–400)
RBC: 4.4 MIL/uL (ref 3.87–5.11)
RDW: 13.7 % (ref 11.5–15.5)
WBC: 10.6 10*3/uL — AB (ref 4.0–10.5)

## 2018-04-13 LAB — BASIC METABOLIC PANEL WITH GFR
Anion gap: 7 (ref 5–15)
BUN: 9 mg/dL (ref 6–20)
CO2: 29 mmol/L (ref 22–32)
Calcium: 9 mg/dL (ref 8.9–10.3)
Chloride: 101 mmol/L (ref 101–111)
Creatinine, Ser: 0.7 mg/dL (ref 0.44–1.00)
GFR calc Af Amer: >60 mL/min
GFR calc non Af Amer: >60 mL/min
Glucose, Bld: 123 mg/dL — ABNORMAL HIGH (ref 65–99)
Potassium: 3.8 mmol/L (ref 3.5–5.1)
Sodium: 137 mmol/L (ref 135–145)

## 2018-04-13 LAB — I-STAT CG4 LACTIC ACID, ED: Lactic Acid, Venous: 1.1 mmol/L (ref 0.5–1.9)

## 2018-04-13 LAB — I-STAT BETA HCG BLOOD, ED (MC, WL, AP ONLY): I-stat hCG, quantitative: <5 m[IU]/mL

## 2018-04-13 MED ORDER — DOXYCYCLINE HYCLATE 100 MG PO CAPS: 100.0000 mg | ORAL_CAPSULE | Freq: Two times a day (BID) | ORAL | 0 refills | Status: DC

## 2018-04-13 MED ORDER — DOXYCYCLINE HYCLATE 100 MG PO TABS
100.0000 mg | ORAL_TABLET | Freq: Once | ORAL | Status: AC
  Administered 2018-04-13: 100 mg via ORAL
  Filled 2018-04-13: qty 1

## 2018-04-13 NOTE — Discharge Instructions (Addendum)
1. Medications: Doxycycline, usual home medications 2. Treatment: rest, drink plenty of fluids,  3. Follow Up: Please followup with your primary doctor in 2-3 days for discussion of your diagnoses and further evaluation after today's visit; if you do not have a primary care doctor use the resource guide provided to find one; Please return to the ER for worsening infections, purulent drainage, fevers, streaking of the redness
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
## 2018-09-06 ENCOUNTER — Telehealth: Payer: Self-pay | Admitting: Emergency Medicine

## 2018-09-06 NOTE — Telephone Encounter (Signed)
Lost to followup 

## 2018-10-02 ENCOUNTER — Emergency Department (HOSPITAL_COMMUNITY)
Admission: EM | Admit: 2018-10-02 | Discharge: 2018-10-03 | Disposition: A | Discharge status: Home / Self Care | Payer: Self-pay | Attending: Emergency Medicine | Admitting: Emergency Medicine

## 2018-10-02 ENCOUNTER — Other Ambulatory Visit: Payer: Self-pay

## 2018-10-02 ENCOUNTER — Encounter (HOSPITAL_COMMUNITY): Payer: Self-pay

## 2018-10-02 DIAGNOSIS — K130 Diseases of lips: Secondary | ICD-10-CM | POA: Insufficient documentation

## 2018-10-02 DIAGNOSIS — J441 Chronic obstructive pulmonary disease with (acute) exacerbation: Secondary | ICD-10-CM | POA: Insufficient documentation

## 2018-10-02 DIAGNOSIS — J069 Acute upper respiratory infection, unspecified: Secondary | ICD-10-CM | POA: Insufficient documentation

## 2018-10-02 DIAGNOSIS — Z79899 Other long term (current) drug therapy: Secondary | ICD-10-CM | POA: Insufficient documentation

## 2018-10-02 DIAGNOSIS — J45909 Unspecified asthma, uncomplicated: Secondary | ICD-10-CM | POA: Insufficient documentation

## 2018-10-02 DIAGNOSIS — F17210 Nicotine dependence, cigarettes, uncomplicated: Secondary | ICD-10-CM | POA: Insufficient documentation

## 2018-10-02 MED ORDER — ALBUTEROL SULFATE (2.5 MG/3ML) 0.083% IN NEBU
5.0000 mg | INHALATION_SOLUTION | Freq: Once | RESPIRATORY_TRACT | Status: AC
  Administered 2018-10-02: 5 mg via RESPIRATORY_TRACT
  Filled 2018-10-02: qty 6

## 2018-10-02 MED ORDER — IPRATROPIUM BROMIDE 0.02 % IN SOLN
0.5000 mg | Freq: Once | RESPIRATORY_TRACT | Status: AC
  Administered 2018-10-02: 0.5 mg via RESPIRATORY_TRACT
  Filled 2018-10-02: qty 2.5

## 2018-10-02 MED ORDER — PREDNISONE 20 MG PO TABS
60.0000 mg | ORAL_TABLET | Freq: Once | ORAL | Status: AC
  Administered 2018-10-02: 60 mg via ORAL
  Filled 2018-10-02: qty 3

## 2018-10-02 MED ORDER — DOXYCYCLINE HYCLATE 100 MG PO TABS
100.0000 mg | ORAL_TABLET | Freq: Once | ORAL | Status: AC
  Administered 2018-10-02: 100 mg via ORAL
  Filled 2018-10-02: qty 1

## 2018-10-02 NOTE — ED Triage Notes (Signed)
Pt reports congestion, productive cough, and runny nose. She states that her mucus is yellow. These symptoms started 3 days ago. Pt also reports that her bottom lip is swollen. She states that she plucked a hair under her bottom lip and then is started swelling. No airway concerns at this time. A&Ox4. Ambulatory.

## 2018-10-02 NOTE — ED Provider Notes (Signed)
TIME SEEN: 11:11 PM  CHIEF COMPLAINT: Nasal congestion, cough, wheezing; lip swelling  HPI: Patient is a 42 year old female with history of asthma, COPD, substance abuse who presents to the emergency department with complaints of 3 days of productive cough, wheezing, nasal congestion.  No fever.  She is a smoker.  Using her albuterol inhaler without much relief.  Patient also states that she is having lower lip swelling.  She states she had a small hair next to her right lower lip that she plucked and then noticed swelling of her lip.  No drainage.  No fever.  Has been taking unknown antibiotic for the past 2 days without any relief.  ROS: See HPI Constitutional: no fever  Eyes: no drainage  ENT: no runny nose   Cardiovascular:  no chest pain  Resp:  SOB  GI: no vomiting GU: no dysuria Integumentary: no rash  Allergy: no hives  Musculoskeletal: no leg swelling  Neurological: no slurred speech ROS otherwise negative  PAST MEDICAL HISTORY/PAST SURGICAL HISTORY:  Past Medical History:  Diagnosis Date  . Abscess   . Anxiety   . Asthma    IN CHILDHOOD  . Bipolar disorder (HCC)   . Chronic narcotic dependence (HCC)   . COPD (chronic obstructive pulmonary disease) (HCC)   . Depression   . IVDU (intravenous drug user)   . MRSA (methicillin resistant Staphylococcus aureus)   . Pneumonia "several times"   (07/15/2016)  . Shortness of breath   . Smoker     MEDICATIONS:  Prior to Admission medications   Medication Sig Start Date End Date Taking? Authorizing Provider  albuterol (PROVENTIL HFA;VENTOLIN HFA) 108 (90 Base) MCG/ACT inhaler Inhale 1-2 puffs into the lungs every 6 (six) hours as needed for wheezing or shortness of breath. 07/22/16   Rai, Delene Ruffini, MD  doxycycline (VIBRAMYCIN) 100 MG capsule Take 1 capsule (100 mg total) by mouth 2 (two) times daily. 04/13/18   Muthersbaugh, Dahlia Client, PA-C    ALLERGIES:  Allergies  Allergen Reactions  . Bee Venom Shortness Of Breath and  Swelling  . Sulfa Antibiotics Rash    SOCIAL HISTORY:  Social History   Tobacco Use  . Smoking status: Current Every Day Smoker    Packs/day: 0.50    Years: 25.00    Pack years: 12.50    Types: Cigarettes  . Smokeless tobacco: Never Used  Substance Use Topics  . Alcohol use: Yes    Alcohol/week: 6.0 standard drinks    Types: 6 Cans of beer per week    FAMILY HISTORY: Family History  Problem Relation Age of Onset  . Multiple sclerosis Mother   . Other Father        Suicide    EXAM: BP (!) 139/97 (BP Location: Left Arm)   Pulse 79   Temp 98.2 F (36.8 C)   Resp 18   Ht 5\' 6"  (1.676 m)   Wt 56.7 kg   SpO2 95%   BMI 20.18 kg/m  CONSTITUTIONAL: Alert and oriented and responds appropriately to questions. Well-appearing; well-nourished, non-toxic appearing, afebrile, smiling and laughing HEAD: Normocephalic EYES: Conjunctivae clear, pupils appear equal, EOMI ENT: normal nose; moist mucous membranes, small amount of swelling noted to the right lower lip with a small crusted lesion but no vesicular lesions or active drainage, no facial redness or warmth, normal speech NECK: Supple, no meningismus, no nuchal rigidity, no LAD  CARD: RRR; S1 and S2 appreciated; no murmurs, no clicks, no rubs, no gallops RESP: Normal chest  excursion without splinting or tachypnea; patient has instrument expiratory wheezes diffusely, productive cough, no rhonchi or rales, no hypoxia or respiratory distress, speaking full sentences ABD/GI: Normal bowel sounds; non-distended; soft, non-tender, no rebound, no guarding, no peritoneal signs, no hepatosplenomegaly BACK:  The back appears normal and is non-tender to palpation, there is no CVA tenderness EXT: Normal ROM in all joints; non-tender to palpation; no edema; normal capillary refill; no cyanosis, no calf tenderness or swelling    SKIN: Normal color for age and race; warm; no rash NEURO: Moves all extremities equally PSYCH: The patient's mood  and manner are appropriate. Grooming and personal hygiene are appropriate.  MEDICAL DECISION MAKING: Patient here with likely URI causing cough, congestion, wheezing.  Will give albuterol, Atrovent, prednisone.  She declines chest x-ray at this time.  Also has lower lip swelling concerning for superimposed infection.  Does not appear to be angioedema.  No other airway involvement.  No abscess for drainage.  Does not look like herpes.  Will treat with doxycycline which would cover her lip and any possible bacterial infection of her upper respiratory symptoms.  ED PROGRESS: Patient reports feeling much better.  She is still wheezing but refuses any further breathing treatments.  States she has albuterol inhaler at home.  Will discharge with doxycycline, prednisone burst.  Discussed return precautions.  Patient verbalized understanding and is comfortable with this plan.    At this time, I do not feel there is any life-threatening condition present. I have reviewed and discussed all results (EKG, imaging, lab, urine as appropriate) and exam findings with patient/family. I have reviewed nursing notes and appropriate previous records.  I feel the patient is safe to be discharged home without further emergent workup and can continue workup as an outpatient as needed. Discussed usual and customary return precautions. Patient/family verbalize understanding and are comfortable with this plan.  Outpatient follow-up has been provided as needed. All questions have been answered.      Alpheus Stiff, Layla MawKristen N, DO 10/03/18 [phone]

## 2018-10-03 MED ORDER — ALBUTEROL SULFATE HFA 108 (90 BASE) MCG/ACT IN AERS: 2.0000 | INHALATION_SPRAY | RESPIRATORY_TRACT | 0 refills | Status: DC | PRN

## 2018-10-03 MED ORDER — DOXYCYCLINE HYCLATE 100 MG PO CAPS: 100.0000 mg | ORAL_CAPSULE | Freq: Two times a day (BID) | ORAL | 0 refills | Status: DC

## 2018-10-03 MED ORDER — PREDNISONE 20 MG PO TABS: 60.0000 mg | ORAL_TABLET | Freq: Every day | ORAL | 0 refills | Status: DC

## 2018-10-03 NOTE — ED Notes (Signed)
[id number] Expected date:  Expected time:  Means of arrival:  Comments: Room 2

## 2018-11-28 ENCOUNTER — Ambulatory Visit (HOSPITAL_COMMUNITY)
Admission: EM | Admit: 2018-11-28 | Discharge: 2018-11-28 | Disposition: A | Discharge status: Home / Self Care | Payer: Self-pay | Attending: Family Medicine | Admitting: Family Medicine

## 2018-11-28 ENCOUNTER — Encounter (HOSPITAL_COMMUNITY): Payer: Self-pay

## 2018-11-28 DIAGNOSIS — L03211 Cellulitis of face: Secondary | ICD-10-CM | POA: Insufficient documentation

## 2018-11-28 MED ORDER — CLINDAMYCIN HCL 150 MG PO CAPS: 150.0000 mg | ORAL_CAPSULE | Freq: Four times a day (QID) | ORAL | 1 refills | Status: DC
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
## 2018-11-28 NOTE — ED Triage Notes (Signed)
Pt presents with left eye swelling, drainage, and irritation.

## 2020-05-07 ENCOUNTER — Encounter (HOSPITAL_COMMUNITY): Payer: Self-pay

## 2020-05-07 ENCOUNTER — Other Ambulatory Visit: Payer: Self-pay

## 2020-05-07 ENCOUNTER — Emergency Department (HOSPITAL_COMMUNITY)
Admission: EM | Admit: 2020-05-07 | Discharge: 2020-05-07 | Disposition: A | Discharge status: Home / Self Care | Payer: Self-pay | Attending: Emergency Medicine | Admitting: Emergency Medicine

## 2020-05-07 DIAGNOSIS — F17210 Nicotine dependence, cigarettes, uncomplicated: Secondary | ICD-10-CM | POA: Insufficient documentation

## 2020-05-07 DIAGNOSIS — F1910 Other psychoactive substance abuse, uncomplicated: Secondary | ICD-10-CM | POA: Insufficient documentation

## 2020-05-07 DIAGNOSIS — J449 Chronic obstructive pulmonary disease, unspecified: Secondary | ICD-10-CM | POA: Insufficient documentation

## 2020-05-07 DIAGNOSIS — B182 Chronic viral hepatitis C: Secondary | ICD-10-CM | POA: Insufficient documentation

## 2020-05-07 DIAGNOSIS — L03213 Periorbital cellulitis: Secondary | ICD-10-CM | POA: Insufficient documentation

## 2020-05-07 DIAGNOSIS — F319 Bipolar disorder, unspecified: Secondary | ICD-10-CM | POA: Insufficient documentation

## 2020-05-07 LAB — CBC
HCT: 37.9 % (ref 36.0–46.0)
Hemoglobin: 12.4 g/dL (ref 12.0–15.0)
MCH: 30.8 pg (ref 26.0–34.0)
MCHC: 32.7 g/dL (ref 30.0–36.0)
MCV: 94 fL (ref 80.0–100.0)
Platelets: 155 10*3/uL (ref 150–400)
RBC: 4.03 MIL/uL (ref 3.87–5.11)
RDW: 14 % (ref 11.5–15.5)
WBC: 9.2 10*3/uL (ref 4.0–10.5)
nRBC: 0 % (ref 0.0–0.2)

## 2020-05-07 LAB — BASIC METABOLIC PANEL
Anion gap: 10 (ref 5–15)
BUN: 8 mg/dL (ref 6–20)
CO2: 23 mmol/L (ref 22–32)
Calcium: 8.8 mg/dL — ABNORMAL LOW (ref 8.9–10.3)
Chloride: 101 mmol/L (ref 98–111)
Creatinine, Ser: 0.71 mg/dL (ref 0.44–1.00)
GFR calc Af Amer: >60 mL/min (ref >60)
GFR calc non Af Amer: >60 mL/min (ref >60)
Glucose, Bld: 113 mg/dL — ABNORMAL HIGH (ref 70–99)
Potassium: 3.7 mmol/L (ref 3.5–5.1)
Sodium: 134 mmol/L — ABNORMAL LOW (ref 135–145)

## 2020-05-07 MED ORDER — CLINDAMYCIN HCL 150 MG PO CAPS: 300.0000 mg | ORAL_CAPSULE | Freq: Three times a day (TID) | ORAL | 0 refills | Status: AC

## 2020-05-07 MED ORDER — AMOXICILLIN 500 MG PO CAPS: 500.0000 mg | ORAL_CAPSULE | Freq: Three times a day (TID) | ORAL | 0 refills | Status: AC

## 2020-05-07 MED ORDER — CLINDAMYCIN HCL 150 MG PO CAPS
300.0000 mg | ORAL_CAPSULE | Freq: Once | ORAL | Status: AC
  Administered 2020-05-07: 300 mg via ORAL
  Filled 2020-05-07: qty 2

## 2020-05-07 MED ORDER — AMOXICILLIN 500 MG PO CAPS
1000.0000 mg | ORAL_CAPSULE | Freq: Once | ORAL | Status: AC
  Administered 2020-05-07: 1000 mg via ORAL
  Filled 2020-05-07: qty 2

## 2020-05-07 NOTE — ED Triage Notes (Addendum)
Pt arrives to ED w/ c/o R eye swelling that started this morning. Pt denies pain. Pt denies sob, throat swelling, resp e/u. Pt denies allergen exposure.
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
## 2020-05-07 NOTE — Discharge Instructions (Addendum)
I am prescribing you 2 antibiotics.  The first is called amoxicillin.  The second is called clindamycin.  You are going to take both these medications 3 times a day for the next 7 days.  Please do not stop taking either of these early even if you experience a total resolution of your symptoms.  If you develop any worsening symptoms at all, you need to return to the emergency department immediately for reevaluation.  It was a pleasure to meet you.

## 2020-05-07 NOTE — ED Notes (Signed)
Pt verbalized understanding of discharge instructions. Follow up care and prescriptions reviewed, pt had no further questions, ambulated independently to lobby. 

## 2020-08-01 ENCOUNTER — Inpatient Hospital Stay (HOSPITAL_COMMUNITY)
Admission: EM | Admit: 2020-08-01 | Discharge: 2020-08-04 | DRG: 603 | Disposition: A | Discharge status: Home / Self Care | Payer: Self-pay | Attending: Obstetrics & Gynecology | Admitting: Obstetrics & Gynecology

## 2020-08-01 DIAGNOSIS — R591 Generalized enlarged lymph nodes: Secondary | ICD-10-CM | POA: Diagnosis present

## 2020-08-01 DIAGNOSIS — Z820 Family history of epilepsy and other diseases of the nervous system: Secondary | ICD-10-CM

## 2020-08-01 DIAGNOSIS — L02414 Cutaneous abscess of left upper limb: Principal | ICD-10-CM | POA: Diagnosis present

## 2020-08-01 DIAGNOSIS — Z20822 Contact with and (suspected) exposure to covid-19: Secondary | ICD-10-CM | POA: Diagnosis present

## 2020-08-01 DIAGNOSIS — F419 Anxiety disorder, unspecified: Secondary | ICD-10-CM | POA: Diagnosis present

## 2020-08-01 DIAGNOSIS — L0291 Cutaneous abscess, unspecified: Secondary | ICD-10-CM

## 2020-08-01 DIAGNOSIS — Z8614 Personal history of Methicillin resistant Staphylococcus aureus infection: Secondary | ICD-10-CM

## 2020-08-01 DIAGNOSIS — F319 Bipolar disorder, unspecified: Secondary | ICD-10-CM | POA: Diagnosis present

## 2020-08-01 DIAGNOSIS — J449 Chronic obstructive pulmonary disease, unspecified: Secondary | ICD-10-CM | POA: Diagnosis present

## 2020-08-01 DIAGNOSIS — L0390 Cellulitis, unspecified: Secondary | ICD-10-CM | POA: Diagnosis present

## 2020-08-01 DIAGNOSIS — Z79899 Other long term (current) drug therapy: Secondary | ICD-10-CM

## 2020-08-01 DIAGNOSIS — Z882 Allergy status to sulfonamides status: Secondary | ICD-10-CM

## 2020-08-01 DIAGNOSIS — Z91030 Bee allergy status: Secondary | ICD-10-CM

## 2020-08-01 DIAGNOSIS — L02419 Cutaneous abscess of limb, unspecified: Secondary | ICD-10-CM | POA: Diagnosis present

## 2020-08-01 DIAGNOSIS — F1120 Opioid dependence, uncomplicated: Secondary | ICD-10-CM | POA: Diagnosis present

## 2020-08-01 DIAGNOSIS — Z9049 Acquired absence of other specified parts of digestive tract: Secondary | ICD-10-CM

## 2020-08-01 DIAGNOSIS — F17210 Nicotine dependence, cigarettes, uncomplicated: Secondary | ICD-10-CM | POA: Diagnosis present

## 2020-08-01 DIAGNOSIS — Z7952 Long term (current) use of systemic steroids: Secondary | ICD-10-CM

## 2020-08-01 DIAGNOSIS — F1910 Other psychoactive substance abuse, uncomplicated: Secondary | ICD-10-CM

## 2020-08-01 DIAGNOSIS — F32A Depression, unspecified: Secondary | ICD-10-CM | POA: Diagnosis present

## 2020-08-01 DIAGNOSIS — F1490 Cocaine use, unspecified, uncomplicated: Secondary | ICD-10-CM | POA: Diagnosis present

## 2020-08-01 DIAGNOSIS — F1290 Cannabis use, unspecified, uncomplicated: Secondary | ICD-10-CM | POA: Diagnosis present

## 2020-08-01 DIAGNOSIS — G8929 Other chronic pain: Secondary | ICD-10-CM | POA: Diagnosis present

## 2020-08-02 ENCOUNTER — Other Ambulatory Visit: Payer: Self-pay

## 2020-08-02 ENCOUNTER — Encounter (HOSPITAL_COMMUNITY): Payer: Self-pay | Admitting: Emergency Medicine

## 2020-08-02 ENCOUNTER — Emergency Department (HOSPITAL_COMMUNITY): Payer: Self-pay

## 2020-08-02 DIAGNOSIS — L0390 Cellulitis, unspecified: Secondary | ICD-10-CM | POA: Diagnosis present

## 2020-08-02 DIAGNOSIS — L02419 Cutaneous abscess of limb, unspecified: Secondary | ICD-10-CM | POA: Diagnosis present

## 2020-08-02 LAB — CBC
HCT: 38.1 % (ref 36.0–46.0)
Hemoglobin: 12.4 g/dL (ref 12.0–15.0)
MCH: 30.5 pg (ref 26.0–34.0)
MCHC: 32.5 g/dL (ref 30.0–36.0)
MCV: 93.8 fL (ref 80.0–100.0)
Platelets: 190 10*3/uL (ref 150–400)
RBC: 4.06 MIL/uL (ref 3.87–5.11)
RDW: 14.4 % (ref 11.5–15.5)
WBC: 8 10*3/uL (ref 4.0–10.5)
nRBC: 0 % (ref 0.0–0.2)

## 2020-08-02 LAB — BASIC METABOLIC PANEL
Anion gap: 11 (ref 5–15)
BUN: 7 mg/dL (ref 6–20)
CO2: 24 mmol/L (ref 22–32)
Calcium: 9.2 mg/dL (ref 8.9–10.3)
Chloride: 99 mmol/L (ref 98–111)
Creatinine, Ser: 0.63 mg/dL (ref 0.44–1.00)
GFR calc Af Amer: >60 mL/min (ref >60)
GFR calc non Af Amer: >60 mL/min (ref >60)
Glucose, Bld: 78 mg/dL (ref 70–99)
Potassium: 3.7 mmol/L (ref 3.5–5.1)
Sodium: 134 mmol/L — ABNORMAL LOW (ref 135–145)

## 2020-08-02 LAB — RESPIRATORY PANEL BY RT PCR (FLU A&B, COVID)
Influenza A by PCR: NEGATIVE
Influenza B by PCR: NEGATIVE
SARS Coronavirus 2 by RT PCR: NEGATIVE

## 2020-08-02 LAB — LACTIC ACID, PLASMA: Lactic Acid, Venous: 1 mmol/L (ref 0.5–1.9)

## 2020-08-02 LAB — POC URINE PREG, ED: Preg Test, Ur: NEGATIVE

## 2020-08-02 MED ORDER — VANCOMYCIN HCL IN DEXTROSE 1-5 GM/200ML-% IV SOLN
1000.0000 mg | Freq: Two times a day (BID) | INTRAVENOUS | Status: DC
  Administered 2020-08-03 – 2020-08-04 (×3): 1000 mg via INTRAVENOUS
  Filled 2020-08-02 (×4): qty 200

## 2020-08-02 MED ORDER — METHADONE HCL 10 MG PO TABS
120.0000 mg | ORAL_TABLET | Freq: Every day | ORAL | Status: DC
  Administered 2020-08-03 – 2020-08-04 (×2): 120 mg via ORAL
  Filled 2020-08-02 (×2): qty 12

## 2020-08-02 MED ORDER — NICOTINE 21 MG/24HR TD PT24
21.0000 mg | MEDICATED_PATCH | Freq: Once | TRANSDERMAL | Status: AC
  Administered 2020-08-02: 21 mg via TRANSDERMAL
  Filled 2020-08-02: qty 1

## 2020-08-02 MED ORDER — VANCOMYCIN HCL IN DEXTROSE 1-5 GM/200ML-% IV SOLN
1000.0000 mg | Freq: Once | INTRAVENOUS | Status: AC
  Administered 2020-08-02: 1000 mg via INTRAVENOUS
  Filled 2020-08-02: qty 200

## 2020-08-02 MED ORDER — HYDROXYZINE HCL 25 MG PO TABS
25.0000 mg | ORAL_TABLET | Freq: Once | ORAL | Status: AC
  Administered 2020-08-02: 25 mg via ORAL
  Filled 2020-08-02: qty 1

## 2020-08-02 MED ORDER — ACETAMINOPHEN 650 MG RE SUPP: 650.0000 mg | Freq: Four times a day (QID) | RECTAL | Status: DC | PRN

## 2020-08-02 MED ORDER — IOHEXOL 300 MG/ML  SOLN
100.0000 mL | Freq: Once | INTRAMUSCULAR | Status: AC | PRN
  Administered 2020-08-02: 100 mL via INTRAVENOUS

## 2020-08-02 MED ORDER — ACETAMINOPHEN 325 MG PO TABS: 650.0000 mg | ORAL_TABLET | Freq: Four times a day (QID) | ORAL | Status: DC | PRN

## 2020-08-02 MED ORDER — GABAPENTIN 600 MG PO TABS
600.0000 mg | ORAL_TABLET | Freq: Three times a day (TID) | ORAL | Status: DC
  Administered 2020-08-02 – 2020-08-04 (×6): 600 mg via ORAL
  Filled 2020-08-02 (×7): qty 1

## 2020-08-02 MED ORDER — ARIPIPRAZOLE 10 MG PO TABS
10.0000 mg | ORAL_TABLET | Freq: Every day | ORAL | Status: DC
  Administered 2020-08-03 – 2020-08-04 (×2): 10 mg via ORAL
  Filled 2020-08-02 (×2): qty 1

## 2020-08-02 MED ORDER — ALBUTEROL SULFATE HFA 108 (90 BASE) MCG/ACT IN AERS
1.0000 | INHALATION_SPRAY | Freq: Four times a day (QID) | RESPIRATORY_TRACT | Status: DC | PRN
  Filled 2020-08-02: qty 6.7

## 2020-08-02 MED ORDER — HYDROXYZINE HCL 25 MG PO TABS
50.0000 mg | ORAL_TABLET | Freq: Three times a day (TID) | ORAL | Status: DC
  Administered 2020-08-02 – 2020-08-04 (×6): 50 mg via ORAL
  Filled 2020-08-02 (×6): qty 2

## 2020-08-02 MED ORDER — HEPARIN SODIUM (PORCINE) 5000 UNIT/ML IJ SOLN
5000.0000 [IU] | Freq: Three times a day (TID) | INTRAMUSCULAR | Status: DC
  Filled 2020-08-02: qty 1

## 2020-08-02 NOTE — ED Triage Notes (Signed)
Pt presents to ED POV. Pt c/o swelling and pain underneath her L arm. Pt reports that she injected drugs into arm last week and her arm has been getting worse since. Pt denies fever.

## 2020-08-02 NOTE — ED Provider Notes (Signed)
Care assumed at shift change from [facility], [place], pending CT imaging and disposition. See their note for full HPI and workup. Briefly, pt presenting with pain and swelling to left arm x3 days. Pt endorses IVDU in this area - cocaine and heroin. Plan for CT arm, vanc per pharmacist. Pt is not septic, no systemic symptoms. Hx of MRSA.    Physical Exam  BP (!) 153/96 (BP Location: Right Arm)   Pulse 88   Temp 98.4 F (36.9 C)   Resp 15   SpO2 100%   Physical Exam Constitutional:      General: She is not in acute distress.    Appearance: She is not ill-appearing.  Cardiovascular:     Rate and Rhythm: Normal rate.  Pulmonary:     Effort: Pulmonary effort is normal.  Musculoskeletal:     Comments: Left upper arm, medial and proximal posterior aspect, with induration and swelling present. No TTP. Intact radial pulse. No fluctuance.     ED Course/Procedures   Clinical Course as of Aug 08 1319  Thu Aug 02, 2020  1740 Patient without complaint on evaluation. Left arm with induration to proximal arm extending towards forearm. No tenderness. No fluctuance. Intact radial pulse   [JR]  2010 Consulted with Dr. Aundria Rud of orthopedics.  Suggests consult with IR to evaluate given location of abscess may be better drained percutaneously.   [JR]  2012 Discussed with Dr. Deanne Coffer with IR, will evaluate patient in the morning for draining abscess   [JR]    Clinical Course User Index [JR] Akeem Heppler, [place], PA-C    Procedures Results for orders placed or performed during the hospital encounter of 08/01/20  Culture, blood (routine x 2)   Specimen: BLOOD RIGHT ARM  Result Value Ref Range   Specimen Description BLOOD RIGHT ARM    Special Requests      BOTTLES DRAWN AEROBIC AND ANAEROBIC Blood Culture adequate volume   Culture      NO GROWTH 5 DAYS Performed at [facility], [address] [place], [place] [address]    Report Status 08/07/2020 FINAL   Respiratory Panel by RT PCR (Flu  A&B, Covid) - Nasopharyngeal Swab   Specimen: Nasopharyngeal Swab  Result Value Ref Range   SARS Coronavirus 2 by RT PCR NEGATIVE NEGATIVE   Influenza A by PCR NEGATIVE NEGATIVE   Influenza B by PCR NEGATIVE NEGATIVE  Surgical pcr screen   Specimen: Nasal Mucosa; Nasal Swab  Result Value Ref Range   MRSA, PCR NEGATIVE NEGATIVE   Staphylococcus aureus NEGATIVE NEGATIVE  Aerobic/Anaerobic Culture (surgical/deep wound)   Specimen: Abscess  Result Value Ref Range   Specimen Description ABSCESS LEFT ARM    Special Requests NONE    Gram Stain      MANY WBC PRESENT, PREDOMINANTLY PMN MANY GRAM POSITIVE COCCI Performed at [facility], [address] [place], [place] [address]    Culture      ABUNDANT STREPTOCOCCUS INTERMEDIUS NO ANAEROBES ISOLATED; CULTURE IN PROGRESS FOR 5 DAYS    Report Status PENDING    Organism ID, Bacteria STREPTOCOCCUS INTERMEDIUS       Susceptibility   Streptococcus intermedius - MIC*    PENICILLIN <=0.06 SENSITIVE Sensitive     CEFTRIAXONE 0.25 SENSITIVE Sensitive     ERYTHROMYCIN INTERMEDIATE Intermediate     LEVOFLOXACIN <=0.25 SENSITIVE Sensitive     VANCOMYCIN 0.5 SENSITIVE Sensitive     * ABUNDANT STREPTOCOCCUS INTERMEDIUS  CBC  Result Value  Ref Range   WBC 8.0 4.0 - 10.5 K/uL   RBC 4.06 3.87 - 5.11 MIL/uL   Hemoglobin 12.4 12.0 - 15.0 g/dL   HCT 96.7 36 - 46 %   MCV 93.8 80.0 - 100.0 fL   MCH 30.5 26.0 - 34.0 pg   MCHC 32.5 30.0 - 36.0 g/dL   RDW 89.3 81.0 - 17.5 %   Platelets 190 150 - 400 K/uL   nRBC 0.0 0.0 - 0.2 %  Basic metabolic panel  Result Value Ref Range   Sodium 134 (L) 135 - 145 mmol/L   Potassium 3.7 3.5 - 5.1 mmol/L   Chloride 99 98 - 111 mmol/L   CO2 24 22 - 32 mmol/L   Glucose, Bld 78 70 - 99 mg/dL   BUN 7 6 - 20 mg/dL   Creatinine, Ser 1.02 0.44 - 1.00 mg/dL   Calcium 9.2 8.9 - 58.5 mg/dL   GFR calc non Af Amer >60 >60 mL/min   GFR calc Af Amer >60 >60 mL/min   Anion gap 11 5 - 15  Lactic acid, plasma  Result  Value Ref Range   Lactic Acid, Venous 1.0 0.5 - 1.9 mmol/L  Basic metabolic panel  Result Value Ref Range   Sodium 136 135 - 145 mmol/L   Potassium 4.0 3.5 - 5.1 mmol/L   Chloride 102 98 - 111 mmol/L   CO2 25 22 - 32 mmol/L   Glucose, Bld 81 70 - 99 mg/dL   BUN 7 6 - 20 mg/dL   Creatinine, Ser 2.77 0.44 - 1.00 mg/dL   Calcium 9.2 8.9 - 82.4 mg/dL   GFR calc non Af Amer >60 >60 mL/min   GFR calc Af Amer >60 >60 mL/min   Anion gap 9 5 - 15  CBC  Result Value Ref Range   WBC 6.7 4.0 - 10.5 K/uL   RBC 3.98 3.87 - 5.11 MIL/uL   Hemoglobin 12.2 12.0 - 15.0 g/dL   HCT 23.5 36 - 46 %   MCV 93.2 80.0 - 100.0 fL   MCH 30.7 26.0 - 34.0 pg   MCHC 32.9 30.0 - 36.0 g/dL   RDW 36.1 44.3 - 15.4 %   Platelets 185 150 - 400 K/uL   nRBC 0.0 0.0 - 0.2 %  Basic metabolic panel  Result Value Ref Range   Sodium 131 (L) 135 - 145 mmol/L   Potassium 3.8 3.5 - 5.1 mmol/L   Chloride 98 98 - 111 mmol/L   CO2 20 (L) 22 - 32 mmol/L   Glucose, Bld 115 (H) 70 - 99 mg/dL   BUN 9 6 - 20 mg/dL   Creatinine, Ser 0.08 0.44 - 1.00 mg/dL   Calcium 8.6 (L) 8.9 - 10.3 mg/dL   GFR calc non Af Amer >60 >60 mL/min   GFR calc Af Amer >60 >60 mL/min   Anion gap 13 5 - 15  POC urine preg, ED  Result Value Ref Range   Preg Test, Ur NEGATIVE NEGATIVE   CT HUMERUS LEFT W CONTRAST  Result Date: 08/02/2020 CLINICAL DATA:  Pain and swelling medial left arm. IV drug injection in this region with worsening symptoms. EXAM: CT OF THE UPPER LEFT EXTREMITY WITH CONTRAST TECHNIQUE: Multidetector CT imaging of the upper left extremity was performed according to the standard protocol following intravenous contrast administration. CONTRAST:  OMNIPAQUE IOHEXOL 300 MG/ML  SOLN COMPARISON:  None. FINDINGS: Bones/Joint/Cartilage No fracture.  No worrisome lytic or sclerotic osseous abnormality. Ligaments  Suboptimally assessed by CT. Muscles and Tendons 2.3 x 1.8 x 5.6 cm rim enhancing fluid collection is seen in the medial aspect  of the proximal triceps muscle (well demonstrated coronal 86/series 8). Soft tissue gas is seen in the neurovascular bundle in the middle and distal third of the arm, extending down to the region of the elbow. Subcutaneous edema is noted posteriorly at the elbow, tracking up to the level of the mid arm. Lymphadenopathy is noted in the left axilla and visualized portion of the subpectoral space. Soft tissues As above. IMPRESSION: 1. 2.3 x 1.8 x 5.6 cm rim enhancing fluid collection in the medial aspect of the proximal triceps muscle, consistent with abscess. 2. Soft tissue gas in the neurovascular bundle in the middle and distal third of the arm, extending down to the region of the elbow. Given the history of recent injection in this region, this could be secondary to needle injection. Gas from infection is also a consideration. Gas seems to be fairly well contained in the neurovascular bundle making necrotizing fasciitis less likely but not excluded. 3. Lymphadenopathy in the left axilla and visualized portion of the subpectoral space, likely reactive. Follow-up may be warranted to ensure resolution. Electronically Signed   By: Kennith Center M.D.   On: 08/02/2020 18:13     MDM  CT with findings consistent with abscess to proximal medial triceps muscle with gas surrounding NV bundle (given IVDU - Arther Dames fasc is less likely though not excluded). Consulted with orthopedist Dr. Aundria Rud - will evaluate patient in the morning - medicine admission. Recommends consultation to IR for possible image guided needle aspiration considering location of abscess. Discussed with IR physician on call- will evaluate patient in the morning for plan.   Medicine to admit.        Parke Jandreau, [place] N, PA-C 08/07/20 1325    Rolan Bucco, MD 08/09/20 [phone]

## 2020-08-02 NOTE — H&P (Addendum)
Family Medicine Teaching [facility] Admission History and Physical Service Pager: [phone]  Patient name: Wendy Douglas Medical record number: [id number] Date of birth: 02/26/1976 Age: 44 y.o. Gender: female  Primary Care Provider: Patient, No Pcp Per Consultants: Gen Surg, IR Code Status: Full Code Preferred Emergency Contact: Significant other  Chief Complaint: Arm swelling  Assessment and Plan: Wendy Douglas is a 44 y.o. female presenting with left upper arm swelling. PMH is significant for asthma, COPD,  IV drug use, MRSA infection.  Abscess of upper left extremity Patient presenting with left upper arm swelling likely due to abscess from from foreign body injection into soft tissue.  Hemodynamically stable.  Patient has been mildly hypertensive since presentation.  She has remained afebrile, has had normal pulse rate and has not been tachypneic.  BMP WNL  Lactic Acid- 1.0.  No signs of sepsis.  Blood cultures pending.  Likely due to Heroin/Cocaine injection into armpit.  Patient given dose of IV Vancomycin givern past history of MRSA infection. CT of Humerus obtained showing 2.3 x 1.8 x 5.6 cm rim enhancing fluid collection in the medialaspect of the proximal triceps muscle, consistent with abscess.  Gas also seen on CT extending to elbow.  Physical exam is reassuring and patient has very little tenderness to palpation, no appreciated fluctuance of swelling in left arm.  Nec Fasc less likely.  Surgery and IR both consulted from ED.     - Admit to Med/Surg, attending Dr Deirdre Priest - Surgery/IR consulted, both to see in morning to evaluate for drainage of abscess and recs - Monitor vitals, continue to monitor for signs of sepsis  - f/u blood cultures - NPO after midnight -Hold DVT prophylaxis after midnight for possible procedure -Tylenol 650 q6hr for pain  - Condsider f/u CT to ensure resolution of abscess/lymphadnopathy   Substance Use Disorder Patient has history of alcohol  and cocaine use.  Receives daily Methodone from nearby clinic.  Last used heroin and cocaine 1 week ago when patient reports relapse. - Continue Methodone 120 mg daily - COWS q4hr  Asthma Bilateral wheezes heard on exam.  Uses albuterol at home 2 puffs every morning, and throughout day as needed - Albuterol 2 puffs q6hr PRN  Bipolar Disorder Patient takes Abilify at home. - Continue home Abilify  Anxiety Patient received dose of Vistaril earlier today.  Takes 50 mg TID at home. - Continue Vistaril 50 mg TID - Avoid use of Benzodiazapenes  Chronic Pain Taking Gabapentin 600 mg TID at and Robaxin at home. - Continue Gabapentin 600 mg TID, will hold Robaxin for time being  Tobacco Use Reports using half pack per day.  Currently on 21mg  patch. - Decrease to 14mg  patch tomorrow   FEN/GI: NPO at midnight Prophylaxis: Heparin every 8 hours but hold after midnight for possible procedure  Disposition: Med/Surg  History of Present Illness:  Wendy Douglas is a 44 y.o. female presenting with left arm swelling.  She noticed swelling in upper arm starting 3 days ago.  Indicates she attempted to inject heroin and cocaine into left armpit.  Has history of IV drug uswe and indicates she is receiving Methodone treatment.  Indicates last weeks episode was a relapse.  Denies any fever or chills, sweating, n/v/d, or flu-like symptoms.  Indicates she feels anxious and uncomfortable but this is mainly due to sitting in hallway of ED for so long.  Endorses 1-2 alcoholic drinks per week and very rare marijuana use.  Denies any other illicit substance use.  Review Of Systems: Per HPI with the following additions:   Review of Systems  Constitutional: Negative for chills and fever.  Respiratory: Negative for cough and shortness of breath.   Cardiovascular: Negative for chest pain.  Gastrointestinal: Negative for constipation, diarrhea, nausea and vomiting.  Skin: Negative for color change and rash.   Neurological: Negative for tremors.  Psychiatric/Behavioral: The patient is nervous/anxious.      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   Hypokalemia 05/16/2013   Pneumonia 11/15/2012   Acute respiratory failure with hypoxia (HCC) 11/15/2012   Influenza A (H1N1) 11/15/2012   COPD 11/15/2012   Dehydration 11/15/2012   Leukocytosis 11/15/2012   Acute hyponatremia 11/15/2012   SIRS (systemic inflammatory response syndrome) (HCC) 11/15/2012   Chronic narcotic dependence (HCC)    Smoker    Depression     Past Medical History: Past Medical History:  Diagnosis Date   Abscess    Anxiety    Asthma    IN CHILDHOOD   Bipolar disorder (HCC)    Chronic narcotic dependence (HCC)    COPD (chronic obstructive pulmonary disease) (HCC)    Depression    IVDU (intravenous drug user)    MRSA (methicillin resistant Staphylococcus aureus)    Pneumonia "several times"   (07/15/2016)   Shortness of breath    Smoker     Past Surgical History: Past Surgical History:  Procedure Laterality Date   I & D EXTREMITY Left 07/15/2016   Procedure: IRRIGATION AND DEBRIDEMENT LEFT HAND WITH TENOSYNOVECTOMY;  Surgeon: Dominica SeverinWilliam Gramig, MD;  Location: [facility] OR;  Service: Orthopedics;  Laterality: Left;   INCISION AND DRAINAGE ABSCESS Left 07/15/2016   between the fourth and fifth fingers on the dorsum of the hand    LAPAROSCOPIC CHOLECYSTECTOMY     TUBAL LIGATION      Social History: Social History   Tobacco Use   Smoking status: Current Every Day Smoker     Packs/day: 0.50    Years: 25.00    Pack years: 12.50    Types: Cigarettes   Smokeless tobacco: Never Used  Substance Use Topics   Alcohol use: Yes    Alcohol/week: 6.0 standard drinks    Types: 6 Cans of beer per week   Drug use: Yes    Types: IV, Cocaine, Marijuana    Comment: heroin and crack; 07/15/2016 "use cocaine qd; smoke marijuana occasionally"   Additional social history:  Please also refer to relevant sections of EMR.  Family History: Family History  Problem Relation Age of Onset   Multiple sclerosis Mother    Other Father        Suicide    Allergies and Medications: Allergies  Allergen Reactions   Bee Venom Shortness Of Breath and Swelling   Sulfa Antibiotics Rash   No current facility-administered medications on file prior to encounter.   Current Outpatient Medications on File Prior to Encounter  Medication Sig Dispense Refill   albuterol (PROVENTIL HFA;VENTOLIN HFA) 108 (90 Base) MCG/ACT inhaler Inhale 1-2 puffs into the lungs every 6 (six) hours as needed for wheezing or shortness of breath. 1  Inhaler 2   albuterol (PROVENTIL HFA;VENTOLIN HFA) 108 (90 Base) MCG/ACT inhaler Inhale 2 puffs into the lungs every 4 (four) hours as needed for wheezing or shortness of breath. 1 Inhaler 0   doxycycline (VIBRAMYCIN) 100 MG capsule Take 1 capsule (100 mg total) by mouth 2 (two) times daily. 14 capsule 0   methadone (DOLOPHINE) 10 MG tablet Take 60 mg by mouth daily.     predniSONE (DELTASONE) 20 MG tablet Take 3 tablets (60 mg total) by mouth daily. 15 tablet 0    Objective: BP (!) 148/105    Pulse 85    Temp 98.4 F (36.9 C)    Resp 15    Ht 5\' 6"  (1.676 m)    Wt 61.2 kg    SpO2 98%    BMI 21.79 kg/m  Exam:  Physical Exam Constitutional:      General: She is not in acute distress.    Appearance: Normal appearance. She is not ill-appearing.  HENT:     Head: Normocephalic and atraumatic.  Eyes:     Pupils: Pupils are equal, round, and reactive to  light.     Comments: Pupils 2 mm Bilaterally  Cardiovascular:     Rate and Rhythm: Normal rate and regular rhythm.  Pulmonary:     Effort: Pulmonary effort is normal.     Breath sounds: Normal breath sounds.  Musculoskeletal:        General: Swelling present. No tenderness.     Right upper arm: Normal.     Left upper arm: Swelling present. No tenderness.     Right forearm: Normal.     Left forearm: Normal.     Comments: Swelling in left upper extremity compared to left, 3 cm induration palpated along proximal medial triceps, overlying skin appears normal  Skin:    General: Skin is warm.  Neurological:     Mental Status: She is alert.  Psychiatric:        Behavior: Behavior normal.     Comments: Patient appears somewhat anxious during exam     Labs and Imaging: CBC BMET  Recent Labs  Lab 08/02/20 1451  WBC 8.0  HGB 12.4  HCT 38.1  PLT 190   Recent Labs  Lab 08/02/20 1451  NA 134*  K 3.7  CL 99  CO2 24  BUN 7  CREATININE 0.63  GLUCOSE 78  CALCIUM 9.2       EKG: Not performed  EXAM: CT OF THE UPPER LEFT EXTREMITY WITH CONTRAST  TECHNIQUE: Multidetector CT imaging of the upper left extremity was performed according to the standard protocol following intravenous contrast administration.  CONTRAST:  08/04/20 OMNIPAQUE IOHEXOL 300 MG/ML  SOLN  COMPARISON:  None.  FINDINGS: Bones/Joint/Cartilage  No fracture.  No worrisome lytic or sclerotic osseous abnormality.  Ligaments  Suboptimally assessed by CT.  Muscles and Tendons  2.3 x 1.8 x 5.6 cm rim enhancing fluid collection is seen in the medial aspect of the proximal triceps muscle (well demonstrated coronal 86/series 8). Soft tissue gas is seen in the neurovascular bundle in the middle and distal third of the arm, extending down to the region of the elbow. Subcutaneous edema is noted posteriorly at the elbow, tracking up to the level of the mid arm.  Lymphadenopathy is noted in the left  axilla and visualized portion of the subpectoral space.  Soft tissues  As above.  IMPRESSION: 1. 2.3 x 1.8 x 5.6 cm rim enhancing fluid collection in the medial aspect  of the proximal triceps muscle, consistent with abscess. 2. Soft tissue gas in the neurovascular bundle in the middle and distal third of the arm, extending down to the region of the elbow. Given the history of recent injection in this region, this could be secondary to needle injection. Gas from infection is also a consideration. Gas seems to be fairly well contained in the neurovascular bundle making necrotizing fasciitis less likely but not excluded. 3. Lymphadenopathy in the left axilla and visualized portion of the subpectoral space, likely reactive. Follow-up may be warranted to ensure resolution.  Jovita Kussmaul, MD 08/02/2020, 9:45 PM PGY-1, [facility] Family Medicine FPTS Intern pager: [phone], text pages welcome  FPTS Upper-Level Resident Addendum   I have independently interviewed and examined the patient. I have discussed the above with the original author and agree with their documentation. My edits for correction/addition/clarification are in blue.Please see also any attending notes.   Derrel Nip, MD PGY-2, [facility] Family Medicine 08/03/2020 6:20 AM  FPTS Service pager: [phone] (text pages welcome through [facility])

## 2020-08-02 NOTE — ED Notes (Signed)
Multiple attempts have been made to obtain IV access/draw labs. Pt very difficult stick. PA made aware. IV team to help draw labs/gain access.

## 2020-08-02 NOTE — ED Provider Notes (Cosign Needed)
[facility] EMERGENCY DEPARTMENT Provider Note   CSN: [id number] Arrival date & time: 08/01/20  2250     History Chief Complaint  Patient presents with  . Arm Pain    Wendy Douglas is a 44 y.o. female.  HPI      Wendy Douglas is a 44 y.o. female, with a history of asthma, COPD, IV drug abuse, MRSA infection, presenting to the ED with pain and swelling to the left upper extremity worsening over the last 3 days. Patient states she injected heroin as well as cocaine in the left upper arm near the armpit about a week ago and then began having symptoms of pain and swelling as well as spreading redness about 3 days ago. She endorses some tingling in the left pinky with movement of the left arm.  Denies fever/chills, shortness of breath, chest pain, abdominal pain, other numbness, weakness, or any other complaints.   Past Medical History:  Diagnosis Date  . Abscess   . Anxiety   . Asthma    IN CHILDHOOD  . Bipolar disorder (HCC)   . Chronic narcotic dependence (HCC)   . COPD (chronic obstructive pulmonary disease) (HCC)   . Depression   . IVDU (intravenous drug user)   . MRSA (methicillin resistant Staphylococcus aureus)   . Pneumonia "several times"   (07/15/2016)  . Shortness of breath   . Smoker     Patient Active Problem List   Diagnosis Date Noted  . Abscess of forearm, right 03/01/2018  . Abscess 03/01/2018  . Malnutrition of moderate degree 07/18/2016  . Chronic hepatitis C without hepatic coma (HCC)   . Cellulitis of left hand 07/15/2016  . Cellulitis of hand 07/15/2016  . Trichomonosis 07/15/2016  . Polysubstance abuse (HCC) 07/15/2016  . Acute respiratory failure (HCC) 05/16/2013  . 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 Hypokalemia 05/16/2013  . Pneumonia 11/15/2012  . Acute respiratory failure with hypoxia (HCC) 11/15/2012  . Influenza A (H1N1) 11/15/2012  . COPD 11/15/2012  . Dehydration 11/15/2012  . Leukocytosis  11/15/2012  . Acute hyponatremia 11/15/2012  . SIRS (systemic inflammatory response syndrome) (HCC) 11/15/2012  . Chronic narcotic dependence (HCC)   . Smoker   . Depression     Past Surgical History:  Procedure Laterality Date  . I & D EXTREMITY Left 07/15/2016   Procedure: IRRIGATION AND DEBRIDEMENT LEFT HAND WITH TENOSYNOVECTOMY;  Surgeon: Dominica Severin, MD;  Location: [facility] OR;  Service: Orthopedics;  Laterality: Left;  . INCISION AND DRAINAGE ABSCESS Left 07/15/2016   between the fourth and fifth fingers on the dorsum of the hand   . LAPAROSCOPIC CHOLECYSTECTOMY    . TUBAL LIGATION       OB History   No obstetric history on file.     Family History  Problem Relation Age of Onset  . Multiple sclerosis Mother   . Other Father        Suicide    Social History   Tobacco Use  . Smoking status: Current Every Day Smoker    Packs/day: 0.50    Years: 25.00    Pack years: 12.50    Types: Cigarettes  . Smokeless tobacco: Never Used  Substance Use Topics  . Alcohol use: Yes    Alcohol/week: 6.0 standard drinks    Types: 6 Cans of beer per week  . Drug use: Yes    Types: IV, Cocaine, Marijuana    Comment: heroin and crack; 07/15/2016 "use cocaine qd; smoke marijuana  occasionally"    Home Medications Prior to Admission medications   Medication Sig Start Date End Date Taking? Authorizing Provider  albuterol (PROVENTIL HFA;VENTOLIN HFA) 108 (90 Base) MCG/ACT inhaler Inhale 1-2 puffs into the lungs every 6 (six) hours as needed for wheezing or shortness of breath. 07/22/16   Rai, Ripudeep Kirtland Bouchard, MD  albuterol (PROVENTIL HFA;VENTOLIN HFA) 108 (90 Base) MCG/ACT inhaler Inhale 2 puffs into the lungs every 4 (four) hours as needed for wheezing or shortness of breath. 10/03/18   Ward, Layla Maw, DO  doxycycline (VIBRAMYCIN) 100 MG capsule Take 1 capsule (100 mg total) by mouth 2 (two) times daily. 10/03/18   Ward, Layla Maw, DO  methadone (DOLOPHINE) 10 MG tablet Take 60 mg by mouth daily.     [provider]  predniSONE (DELTASONE) 20 MG tablet Take 3 tablets (60 mg total) by mouth daily. 10/03/18   Ward, Layla Maw, DO    Allergies    Bee venom and Sulfa antibiotics  Review of Systems   Review of Systems  Constitutional: Negative for chills and fever.  Respiratory: Negative for shortness of breath.   Cardiovascular: Negative for chest pain.  Gastrointestinal: Negative for abdominal pain, diarrhea, nausea and vomiting.  Musculoskeletal: Positive for arthralgias and myalgias.  Skin: Positive for color change.  Neurological: Negative for weakness and numbness.  All other systems reviewed and are negative.   Physical Exam Updated Vital Signs BP (!) 153/96 (BP Location: Right Arm)   Pulse 88   Temp 98.4 F (36.9 C)   Resp 15   SpO2 100%   Physical Exam Vitals and nursing note reviewed.  Constitutional:      General: She is not in acute distress.    Appearance: She is well-developed. She is not diaphoretic.  HENT:     Head: Normocephalic and atraumatic.     Mouth/Throat:     Mouth: Mucous membranes are moist.     Pharynx: Oropharynx is clear.  Eyes:     Conjunctiva/sclera: Conjunctivae normal.  Cardiovascular:     Rate and Rhythm: Normal rate and regular rhythm.     Pulses: Normal pulses.          Radial pulses are 2+ on the right side and 2+ on the left side.       Posterior tibial pulses are 2+ on the right side and 2+ on the left side.     Heart sounds: Normal heart sounds.     Comments: Tactile temperature in the extremities appropriate and equal bilaterally. Pulmonary:     Effort: Pulmonary effort is normal. No respiratory distress.     Breath sounds: Wheezing present.     Comments: Mild global expiratory wheezing.  Patient states this is not unusual given her COPD. Abdominal:     Palpations: Abdomen is soft.     Tenderness: There is no abdominal tenderness. There is no guarding.  Musculoskeletal:     Cervical back: Neck supple.     Right  lower leg: No edema.     Left lower leg: No edema.     Comments: Swelling, tenderness, erythema, and increased warmth along the left upper arm on the side of the triceps extending into the left forearm.  No noted discrete area of fluctuance. No tenderness, swelling, or pain with range of motion of the left shoulder. No tenderness, swelling, or pain on range of motion in the left elbow or wrist.  Lymphadenopathy:     Cervical: No cervical adenopathy.  Skin:  General: Skin is warm and dry.  Neurological:     Mental Status: She is alert.     Comments: Sensation grossly intact to light touch through each of the nerve distributions of the bilateral upper extremities. Abduction and adduction of the fingers intact against resistance. Grip strength equal bilaterally. Supination and pronation intact against resistance. Strength 5/5 through the cardinal directions of the bilateral wrists. Strength 5/5 with flexion and extension of the bilateral elbows. Patient can touch the thumb to each one of the fingertips without difficulty.  Patient can hold the "OK" sign against resistance.  Psychiatric:        Mood and Affect: Mood and affect normal.        Speech: Speech normal.        Behavior: Behavior normal.             ED Results / Procedures / Treatments   Labs (all labs ordered are listed, but only abnormal results are displayed) Labs Reviewed  CBC  BASIC METABOLIC PANEL    EKG None  Radiology No results found.  Procedures Ultrasound ED Peripheral IV (Provider)  Date/Time: 08/02/2020 2:35 PM Performed by: Anselm Pancoast, PA-C Authorized by: Anselm Pancoast, PA-C   Procedure details:    Indications: multiple failed IV attempts and poor IV access     Skin Prep: chlorhexidine gluconate     Location:  Right AC   Angiocath:  20 G   Bedside Ultrasound Guided: Yes     Images: archived     Patient tolerated procedure without complications: Yes     Dressing applied: Yes     Comments:      Positive flash.  Advanced and flushed without pain, swelling, or other signs of infiltration.   (including critical care time)  Medications Ordered in ED Medications - No data to display  ED Course  I have reviewed the triage vital signs and the nursing notes.  Pertinent labs & imaging results that were available during my care of the patient were reviewed by me and considered in my medical decision making (see chart for details).    MDM Rules/Calculators/A&P                          Patient presents with redness, increased warmth, swelling, and pain to the left arm in the setting of IV drug use. Patient is nontoxic appearing, afebrile, not tachycardic on my exam, not tachypneic, not hypotensive, maintains excellent SPO2 on room air, and is in no apparent distress.   I have reviewed the patient's chart to obtain more information.   I reviewed and interpreted the patient's labs that were available at the time of patient care handoff.  Suspicion for possibility of cellulitis to the left upper extremity. Findings and plan of care discussed with Rolan Bucco, MD.   End of shift patient care handoff report given to [place] Robinson, PA-C. Plan: Patient's imaging of the upper extremity pending.   Final Clinical Impression(s) / ED Diagnoses Final diagnoses:  None    Rx / DC Orders ED Discharge Orders    None       Anselm Pancoast, PA-C 08/02/20 1629

## 2020-08-02 NOTE — ED Notes (Signed)
Unable to get blood pt. Is a hard stick reported to nurse

## 2020-08-02 NOTE — Progress Notes (Signed)
Pharmacy Antibiotic Note  Wendy Douglas is a 44 y.o. female admitted on 08/01/2020 with cellulitis.  Pharmacy has been consulted for vancomycin dosing.  Patient is presenting with 3 days of left arm pain/swelling after injecting heroin and cocaine. No leukocytosis, afebrile. Lactic acid 1. Of note patient has had a wound culture that grew MRSA in the past (2017).   Estimated AUC ~490.   Plan: Vancomycin 1,000mg  IV x1 Vancomycin 1,000 IV every 12 hours.  Goal trough 10-15 mcg/mL.  Follow up with imaging, cultures, antibiotic de-escalation and LOT Monitor renal function and clinical progress    Temp (24hrs), Avg:98.5 F (36.9 C), Min:98.4 F (36.9 C), Max:98.6 F (37 C)  Recent Labs  Lab 08/02/20 1451  WBC 8.0  LATICACIDVEN 1.0    CrCl cannot be calculated (Patient's most recent lab result is older than the maximum 21 days allowed.).    Allergies  Allergen Reactions  . Bee Venom Shortness Of Breath and Swelling  . Sulfa Antibiotics Rash    Antimicrobials this admission: Vancomycin 9/30 >>  Dose adjustments this admission: N/a  Microbiology results: 9/30 BCx: pending  Thank you for allowing pharmacy to be a part of this patient's care.  Charlann Lange [facility] 08/02/2020 3:24 PM

## 2020-08-03 ENCOUNTER — Inpatient Hospital Stay (HOSPITAL_COMMUNITY): Payer: Self-pay

## 2020-08-03 LAB — CBC
HCT: 37.1 % (ref 36.0–46.0)
Hemoglobin: 12.2 g/dL (ref 12.0–15.0)
MCH: 30.7 pg (ref 26.0–34.0)
MCHC: 32.9 g/dL (ref 30.0–36.0)
MCV: 93.2 fL (ref 80.0–100.0)
Platelets: 185 10*3/uL (ref 150–400)
RBC: 3.98 MIL/uL (ref 3.87–5.11)
RDW: 14.4 % (ref 11.5–15.5)
WBC: 6.7 10*3/uL (ref 4.0–10.5)
nRBC: 0 % (ref 0.0–0.2)

## 2020-08-03 LAB — BASIC METABOLIC PANEL
Anion gap: 9 (ref 5–15)
BUN: 7 mg/dL (ref 6–20)
CO2: 25 mmol/L (ref 22–32)
Calcium: 9.2 mg/dL (ref 8.9–10.3)
Chloride: 102 mmol/L (ref 98–111)
Creatinine, Ser: 0.6 mg/dL (ref 0.44–1.00)
GFR calc Af Amer: >60 mL/min (ref >60)
GFR calc non Af Amer: >60 mL/min (ref >60)
Glucose, Bld: 81 mg/dL (ref 70–99)
Potassium: 4 mmol/L (ref 3.5–5.1)
Sodium: 136 mmol/L (ref 135–145)

## 2020-08-03 LAB — SURGICAL PCR SCREEN
MRSA, PCR: NEGATIVE
Staphylococcus aureus: NEGATIVE

## 2020-08-03 MED ORDER — NICOTINE 21 MG/24HR TD PT24
21.0000 mg | MEDICATED_PATCH | Freq: Every day | TRANSDERMAL | Status: DC
  Administered 2020-08-04: 21 mg via TRANSDERMAL
  Filled 2020-08-03 (×2): qty 1

## 2020-08-03 MED ORDER — LIDOCAINE HCL (PF) 1 % IJ SOLN
INTRAMUSCULAR | Status: AC
  Filled 2020-08-03: qty 30

## 2020-08-03 MED ORDER — NICOTINE POLACRILEX 2 MG MT GUM
2.0000 mg | CHEWING_GUM | OROMUCOSAL | Status: DC | PRN
  Administered 2020-08-03: 2 mg via ORAL
  Filled 2020-08-03 (×2): qty 1

## 2020-08-03 NOTE — Plan of Care (Signed)
Patient arrived on floor stable. No complaints of pain. PCR and EKG done in prep for surgery. Will continue to monitor patient.   Problem: Education: Goal: Knowledge of General Education information will improve Description: Including pain rating scale, medication(s)/side effects and non-pharmacologic comfort measures Outcome: Progressing   Problem: Activity: Goal: Risk for activity intolerance will decrease Outcome: Progressing   Problem: Pain Managment: Goal: General experience of comfort will improve Outcome: Progressing   Problem: Safety: Goal: Ability to remain free from injury will improve Outcome: Progressing   Problem: Skin Integrity: Goal: Risk for impaired skin integrity will decrease Outcome: Progressing

## 2020-08-03 NOTE — Progress Notes (Signed)
  FPTS Interim Progress Note  S:Checked on patient s/p I&D of her left upper arm abscess. Patient was comfortably sitting in bed and states she is feeling fine without increased pain. States she currently has a nicotine patch in place and is currently doing fine with that. Denies any complaints at this time   O: BP (!) 142/70 (BP Location: Right Arm)   Pulse 79   Temp 99.3 F (37.4 C) (Oral)   Resp 16   Ht 5\' 6"  (1.676 m)   Wt 61.2 kg   SpO2 98%   BMI 21.79 kg/m    Incision site clean and intact, covered with a bandaid. No drainage appreciated Area around site is slightly erythematous and warm with minimal edema. Left arm medially and distally edematous.   A/P: Abcess Continue Vanc q 12 hours. Will continue to monitor site. Will f/u on blood cultures    Tobacco Use Advised patient that she has nicotine patches ordered and to ask for them when needed, and that she can have gum as well if needed.    , DO 08/03/2020, 9:05 PM PGY-1, [facility] pager [phone]

## 2020-08-03 NOTE — TOC CAGE-AID Note (Signed)
Transition of Care [facility]) - CAGE-AID Screening   Patient Details  Name: Wendy Douglas MRN: [id number] Date of Birth: [birth year]  Transition of Care [facility]) CM/SW Contact:    Jimmy Picket, LCSWA Phone Number: 08/03/2020, 4:55 PM   Clinical Narrative:  CSW spoke to pt via phone. CSW introduced self and explained role at the hospital. Pt reports occasional alcohol use. 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 Pt reports she has had multiple relapses during that time. Pt reports her recent relapse was of cocaine and heroin. Pt reports occasional marijuana use.   Pt reports she has a limited support system consisting of mainly her significant other and her IOP group members. Pt reports being in between counselors at [facility]. Pt reports she is happy with the services provided at [facility]. Pt was receptive to counseling and resources.   CAGE-AID Screening:    Have You Ever Felt You Ought to Cut Down on Your Drinking or Drug Use?: Yes Have People Annoyed You By Critizing Your Drinking Or Drug Use?: No Have You Felt Bad Or Guilty About Your Drinking Or Drug Use?: Yes Have You Ever Had a Drink or Used Drugs First Thing In The Morning to Steady Your Nerves or to Get Rid of a Hangover?: No CAGE-AID Score: 2     Substance abuse interventions: Patient Counseling, Referral to (must comment)    Isabella Stalling Clinical Social Worker [phone]

## 2020-08-03 NOTE — Consult Note (Signed)
Chief Complaint: Patient was seen in consultation today for  Chief Complaint  Patient presents with  . Arm Pain    Referring Physician(s): Dr. Pollie Meyer  Supervising Physician: Gilmer Mor  Patient Status: [facility] - In-pt  History of Present Illness: Wendy Douglas is a 44 y.o. female with a medical history significant for asthma, COPD, IV drug use and MRSA infection. She presented to the ED 08/01/20 with left upper arm swelling, likely due to heroin/cocaine injection. CT imaging obtained.   IMPRESSION: 1. 2.3 x 1.8 x 5.6 cm rim enhancing fluid collection in the medial aspect of the proximal triceps muscle, consistent with abscess. 2. Soft tissue gas in the neurovascular bundle in the middle and distal third of the arm, extending down to the region of the elbow. Given the history of recent injection in this region, this could be secondary to needle injection. Gas from infection is also a consideration. Gas seems to be fairly well contained in the neurovascular bundle making necrotizing fasciitis less likely but not excluded. 3. Lymphadenopathy in the left axilla and visualized portion of the subpectoral space, likely reactive. Follow-up may be warranted to ensure resolution.  Interventional Radiology has been asked to evaluate this patient for an image-guided aspiration of the left upper arm fluid collection.  Past Medical History:  Diagnosis Date  . Abscess   . Anxiety   . Asthma    IN CHILDHOOD  . Bipolar disorder (HCC)   . Chronic narcotic dependence (HCC)   . COPD (chronic obstructive pulmonary disease) (HCC)   . Depression   . IVDU (intravenous drug user)   . MRSA (methicillin resistant Staphylococcus aureus)   . Pneumonia "several times"   (07/15/2016)  . Shortness of breath   . Smoker     Past Surgical History:  Procedure Laterality Date  . I & D EXTREMITY Left 07/15/2016   Procedure: IRRIGATION AND DEBRIDEMENT LEFT HAND WITH TENOSYNOVECTOMY;  Surgeon:  Dominica Severin, MD;  Location: [facility] OR;  Service: Orthopedics;  Laterality: Left;  . INCISION AND DRAINAGE ABSCESS Left 07/15/2016   between the fourth and fifth fingers on the dorsum of the hand   . LAPAROSCOPIC CHOLECYSTECTOMY    . TUBAL LIGATION      Allergies: Bee venom and Sulfa antibiotics  Medications: Prior to Admission medications   Medication Sig Start Date End Date Taking? Authorizing Provider  albuterol (PROVENTIL HFA;VENTOLIN HFA) 108 (90 Base) MCG/ACT inhaler Inhale 1-2 puffs into the lungs every 6 (six) hours as needed for wheezing or shortness of breath. 07/22/16  Yes Rai, Ripudeep K, MD  ARIPiprazole (ABILIFY) 10 MG tablet Take 10 mg by mouth daily.   Yes [provider]  gabapentin (NEURONTIN) 600 MG tablet Take 600 mg by mouth 3 (three) times daily.   Yes [provider]  hydrOXYzine (ATARAX/VISTARIL) 50 MG tablet Take 50 mg by mouth 3 (three) times daily.   Yes [provider]  methadone (DOLOPHINE) 10 MG tablet Take 120 mg by mouth daily.    Yes [provider]  methocarbamol (ROBAXIN) 500 MG tablet Take 500 mg by mouth 3 (three) times daily.   Yes [provider]     Family History  Problem Relation Age of Onset  . Multiple sclerosis Mother   . Other Father        Suicide    Social History   Socioeconomic History  . Marital status: Divorced    Spouse name: Not on file  . Number of children: Not  on file  . Years of education: Not on file  . Highest education level: Not on file  Occupational History  . Not on file  Tobacco Use  . Smoking status: Current Every Day Smoker    Packs/day: 0.50    Years: 25.00    Pack years: 12.50    Types: Cigarettes  . Smokeless tobacco: Never Used  Substance and Sexual Activity  . Alcohol use: Yes    Alcohol/week: 6.0 standard drinks    Types: 6 Cans of beer per week  . Drug use: Yes    Types: IV, Cocaine, Marijuana    Comment: heroin and crack; 07/15/2016 "use cocaine qd;  smoke marijuana occasionally"  . Sexual activity: Not Currently  Other Topics Concern  . Not on file  Social History Narrative  . Not on file   Social Determinants of Health   Financial Resource Strain:   . Difficulty of Paying Living Expenses: Not on file  Food Insecurity:   . Worried About Programme researcher, broadcasting/film/video in the Last Year: Not on file  . Ran Out of Food in the Last Year: Not on file  Transportation Needs:   . Lack of Transportation (Medical): Not on file  . Lack of Transportation (Non-Medical): Not on file  Physical Activity:   . Days of Exercise per Week: Not on file  . Minutes of Exercise per Session: Not on file  Stress:   . Feeling of Stress : Not on file  Social Connections:   . Frequency of Communication with Friends and Family: Not on file  . Frequency of Social Gatherings with Friends and Family: Not on file  . Attends Religious Services: Not on file  . Active Member of Clubs or Organizations: Not on file  . Attends Banker Meetings: Not on file  . Marital Status: Not on file    Review of Systems: A 12 point ROS discussed and pertinent positives are indicated in the HPI above.  All other systems are negative.    Vital Signs: BP 116/79 (BP Location: Right Arm)   Pulse 81   Temp 98.7 F (37.1 C) (Oral)   Resp 18   Ht 5\' 6"  (1.676 m)   Wt 135 lb (61.2 kg)   SpO2 100%   BMI 21.79 kg/m   Physical Exam Constitutional:      General: She is not in acute distress. Pulmonary:     Effort: Pulmonary effort is normal.  Musculoskeletal:     Comments: Left upper arm with visible swelling. Site is warm/hot  Skin:    General: Skin is warm and dry.  Neurological:     Mental Status: She is alert and oriented to person, place, and time.     Imaging: CT HUMERUS LEFT W CONTRAST  Result Date: 08/02/2020 CLINICAL DATA:  Pain and swelling medial left arm. IV drug injection in this region with worsening symptoms. EXAM: CT OF THE UPPER LEFT EXTREMITY  WITH CONTRAST TECHNIQUE: Multidetector CT imaging of the upper left extremity was performed according to the standard protocol following intravenous contrast administration. CONTRAST:  08/04/2020 OMNIPAQUE IOHEXOL 300 MG/ML  SOLN COMPARISON:  None. FINDINGS: Bones/Joint/Cartilage No fracture.  No worrisome lytic or sclerotic osseous abnormality. Ligaments Suboptimally assessed by CT. Muscles and Tendons 2.3 x 1.8 x 5.6 cm rim enhancing fluid collection is seen in the medial aspect of the proximal triceps muscle (well demonstrated coronal 86/series 8). Soft tissue gas is seen in the neurovascular bundle in the middle and  distal third of the arm, extending down to the region of the elbow. Subcutaneous edema is noted posteriorly at the elbow, tracking up to the level of the mid arm. Lymphadenopathy is noted in the left axilla and visualized portion of the subpectoral space. Soft tissues As above. IMPRESSION: 1. 2.3 x 1.8 x 5.6 cm rim enhancing fluid collection in the medial aspect of the proximal triceps muscle, consistent with abscess. 2. Soft tissue gas in the neurovascular bundle in the middle and distal third of the arm, extending down to the region of the elbow. Given the history of recent injection in this region, this could be secondary to needle injection. Gas from infection is also a consideration. Gas seems to be fairly well contained in the neurovascular bundle making necrotizing fasciitis less likely but not excluded. 3. Lymphadenopathy in the left axilla and visualized portion of the subpectoral space, likely reactive. Follow-up may be warranted to ensure resolution. Electronically Signed   By: Kennith Center M.D.   On: 08/02/2020 18:13    Labs:  CBC: Recent Labs    05/07/20 1530 08/02/20 1451 08/03/20 0427  WBC 9.2 8.0 6.7  HGB 12.4 12.4 12.2  HCT 37.9 38.1 37.1  PLT 155 190 185    COAGS: No results for input(s): INR, APTT in the last 8760 hours.  BMP: Recent Labs    05/07/20 1530  08/02/20 1451 08/03/20 0427  NA 134* 134* 136  K 3.7 3.7 4.0  CL 101 99 102  CO2 23 24 25   GLUCOSE 113* 78 81  BUN 8 7 7   CALCIUM 8.8* 9.2 9.2  CREATININE 0.71 0.63 0.60  GFRNONAA >60 >60 >60  GFRAA >60 >60 >60    LIVER FUNCTION TESTS: No results for input(s): BILITOT, AST, ALT, ALKPHOS, PROT, ALBUMIN in the last 8760 hours.  TUMOR MARKERS: No results for input(s): AFPTM, CEA, CA199, CHROMGRNA in the last 8760 hours.  Assessment and Plan:  History of IV drug use, left upper arm swelling/fluid collection: , 44 year old female, presents today to [facility] Interventional Radiology department for an image-guided aspiration of a left upper arm fluid collection.  Risks and benefits discussed with the patient including bleeding, infection, damage to adjacent structures, and sepsis.  All of the patient's questions were answered, patient is agreeable to proceed.  The patient has not been NPO. Labs and vitals have been reviewed. This procedure will be done with local anesthesia only.  Consent signed and in chart.  Thank you for this interesting consult.  I greatly enjoyed meeting Wendy Douglas and look forward to participating in their care.  A copy of this report was sent to the requesting provider on this date.  Electronically Signed: [facility], AGACNP-BC [phone] 08/03/2020, 2:55 PM   I spent a total of 20 Minutes    in face to face in clinical consultation, greater than 50% of which was counseling/coordinating care for left upper arm fluid collection aspiration.

## 2020-08-03 NOTE — Progress Notes (Addendum)
Family Medicine Teaching Service Daily Progress Note Intern Pager: [phone]  Patient name: Wendy Douglas Medical record number: [id number] Date of birth: 12/17/1975 Age: 44 y.o. Gender: female  Primary Care Provider: Patient, No Pcp Per Consultants: Gen Surg, IR Code Status: Full  Pt Overview and Major Events to Date:  Admitted on 9/29  Assessment and Plan:  Tanishi Russellis a 44 y.o.femalepresenting with left upper arm swelling. PMH is significant forasthma, COPD, IV drug use, MRSA infection 2017.   Abscess of upper left extremity  Left upper arm swelling likely due to heroine/cocaine injection into her armpit. CT of Humerus showed fluid collection in the medialaspect of the proximal triceps muscle. Patient given IV Vancomycin due to past history of MRSA infection in 2017. Patient remains afebrile  - Image guided aspiration of LUE abscess by IR on 10/1  - f/u blood culture  - Tylenol 650 q6h for pain  - Transition to po Doxy 100mg   Substance Use Disorder Patient has history of alcohol and cocaine use. Receives daily Methodone. Last used heroin and cocaine 1 week ago when patient reports relapse. -Continue Methodone 120 mg daily -COWS q4hr     Asthma Bilateral wheezes heard on exam.Uses albuterol at home 2 puffsevery morning, and throughout day as needed -Albuterol 2 puffs q6hr PRN  Bipolar Disorder Patient takes Abilify at home. -Continue home Abilify  Anxiety Patient received dose of Vistaril earlier today. Takes 50 mg TID at home. -Continue Vistaril 50 mg TID -Avoid use of Benzodiazapenes  Chronic Pain Taking Gabapentin 600 mg TID at and Robaxin at home. -Continue Gabapentin 600 mg TID -Hold Robaxin   Tobacco Use Reports using half pack per day.  - 21mg  Nicotine patch  - Nicorette gum 2mg  prn   FEN/GI: Regular diet  PPx: None   Disposition: Home today (10/2) or tomorrow   Subjective:   Patient laying comfortably in bed when I examined her.  Denies fever or pain from procedure. Denies any complaints    Objective: Temp:  [98.6 F (37 C)-99.3 F (37.4 C)] 98.6 F (37 C) (10/02 0418) Pulse Rate:  [67-81] 67 (10/02 0418) Resp:  [16-19] 16 (10/02 0418) BP: (95-142)/(51-79) 95/51 (10/02 0418) SpO2:  [98 %-100 %] 98 % (10/02 0418) Physical Exam: General: alert, NAD Cardiovascular: RRR. No murmurs  Respiratory: bilateral wheezes. Normal WOB  Abdomen: soft, non distended Extremities: warm, dry  Derm: Incision site clean and intact, covered with a bandaid. No drainage appreciated Area around site is warm without edema. Left medial and distal arm edematous.  Laboratory: Recent Labs  Lab 08/02/20 1451 08/03/20 0427  WBC 8.0 6.7  HGB 12.4 12.2  HCT 38.1 37.1  PLT 190 185   Recent Labs  Lab 08/02/20 1451 08/03/20 0427 08/04/20 0311  NA 134* 136 131*  K 3.7 4.0 3.8  CL 99 102 98  CO2 24 25 20*  BUN 7 7 9   CREATININE 0.63 0.60 0.69  CALCIUM 9.2 9.2 8.6*  GLUCOSE 78 81 115*    Imaging/Diagnostic Tests: None  08/04/20, DO 08/04/2020, 6:36 AM PGY-1, [place] [facility] FPTS Intern pager: [phone], text pages welcome

## 2020-08-03 NOTE — TOC Initial Note (Signed)
Transition of Care [facility]) - Initial/Assessment Note    Patient Details  Name: Wendy Douglas MRN: [id number] Date of Birth: [birth year]  [facility]) CM/SW Contact:    Epifanio Lesches, RN Phone Number: [phone] 08/03/2020, 12:35 PM  Clinical Narrative:                 Admitted with L arm pain , redness and swelling. Hx of asthma, COPD, IV drug use, MRSA infection. Lives with significant other, Modesta Messing ( recovering addict). States prior to admit independent with ADL's, no DME usage. States with out job, no PCP.  Referral made with CSW regarding substance abuse.Pt states she relapsed last Thursday. States she active with [facility] ( methadone clinic) and IOP.  NCM shared [facility], pt without PCP, no insurance. Pt interested. Post hospital f/u appointment arranged and noted on AVS.  [facility]   [phone] [address] [place] [address]    Next Steps: Follow up on 08/27/2020    Instructions: 10 am with Georgian Co      [facility] team will continue and follow for needs, ex. Match letter.....    Expected Discharge Plan:  (Resides with Modesta Messing)     Patient Goals and CMS Choice        Expected Discharge Plan and Services Expected Discharge Plan:  (Resides with Modesta Messing)   Discharge Planning Services: CM Consult, MATCH Program, Follow-up appt scheduled, Indigent [facility] Clinic                                          Prior Living Arrangements/Services   Lives with:: Significant Other Modesta Messing) Patient language and need for interpreter reviewed:: Yes Do you feel safe going back to the place where you live?: Yes      Need for Family Participation in Patient Care: Yes (Comment) Care giver support system in place?: Yes (comment)   Criminal Activity/Legal Involvement Pertinent to Current Situation/Hospitalization: No - Comment as needed  Activities of Daily Living Home Assistive  Devices/Equipment: None ADL Screening (condition at time of admission) Patient's cognitive ability adequate to safely complete daily activities?: Yes Is the patient deaf or have difficulty hearing?: No Does the patient have difficulty seeing, even when wearing glasses/contacts?: No Does the patient have difficulty concentrating, remembering, or making decisions?: No Patient able to express need for assistance with ADLs?: Yes Does the patient have difficulty dressing or bathing?: No Independently performs ADLs?: Yes (appropriate for developmental age) Does the patient have difficulty walking or climbing stairs?: No Weakness of Legs: None Weakness of Arms/Hands: Left  Permission Sought/Granted                  Emotional Assessment Appearance:: Appears stated age Attitude/Demeanor/Rapport: Engaged Affect (typically observed): Pleasant Orientation: : Oriented to Self, Oriented to Place, Oriented to  Time, Oriented to Situation Alcohol / Substance Use: Illicit Drugs Psych Involvement: No (comment)  Admission diagnosis:  Cellulitis [L03.90] Abscess of upper arm [L02.419] Patient Active Problem List   Diagnosis Date Noted   Cellulitis 08/02/2020   Abscess of upper arm 08/02/2020   Abscess of forearm, right 03/01/2018   Abscess 03/01/2018   Malnutrition of moderate degree 07/18/2016   Chronic hepatitis C without hepatic coma (HCC)    Cellulitis of left hand 07/15/2016   Cellulitis of hand 07/15/2016   Trichomonosis 07/15/2016  Polysubstance abuse (HCC) 07/15/2016   Acute respiratory failure (HCC) 05/16/2013   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 [place] ([place]) [place] [address] Phone: [phone] Fax: [phone]     Social Determinants of Health (SDOH) Interventions    Readmission Risk Interventions No flowsheet data found.

## 2020-08-03 NOTE — Plan of Care (Signed)
  Problem: Health Behavior/Discharge Planning: Goal: Ability to manage health-related needs will improve Outcome: Progressing   Problem: Activity: Goal: Risk for activity intolerance will decrease Outcome: Progressing   Problem: Nutrition: Goal: Adequate nutrition will be maintained Outcome: Progressing   

## 2020-08-03 NOTE — Progress Notes (Signed)
Family Medicine Teaching Service Daily Progress Note Intern Pager: [phone]  Patient name: Wendy Douglas Medical record number: [id number] Date of birth: [birth year] Age: 44 y.o. Gender: female  Primary Care Provider: Patient, No Pcp Per Consultants: Gen Surg, IR Code Status: Full  Pt Overview and Major Events to Date:  Admitted on 9/29  Assessment and Plan:  Wendy Douglas is a 44 y.o. female presenting with left upper arm swelling. PMH is significant for asthma, COPD,  IV drug use, MRSA infection 2017.   Abscess of upper left extremity Left upper arm swelling likely due to abscess, likely due to Heroin/Cocaine injection into armpit. Blood cultures pending. Patient given dose of IV Vancomycin given past history of MRSA infection in 2017. CT of Humerus showed fluid collection in the medialaspect of the proximal triceps muscle, consistent with abscess. Gas also seen on extending to elbow. Surgery and IR both consulted from ED will evaluate today for next steps. Currently on Vancomycin but due to history of MRSA if no improvement or worsening will widen with Clindamycin until culture results. WBC is 6.7 and is afebrile. -Continue IV Vancomycin 1 g q12 -Surgery and IR consulted will be seen this morning and make recommendations -Monitor vitals, continue to monitor for signs of sepsis  -f/u blood cultures -Tylenol 650 q6hr for pain -Condsider f/u CT to ensure resolution of abscess/lymphadnopathy   Substance Use Disorder Patient has history of alcohol and cocaine use. Receives daily Methodone from nearby clinic. Last used heroin and cocaine 1 week ago when patient reports relapse. -Continue Methodone 120 mg daily -COWS q4hr   Asthma Bilateral wheezes heard on exam. Uses albuterol at home 2 puffs every morning, and throughout day as needed -Albuterol 2 puffs q6hr PRN   Bipolar Disorder Patient takes Abilify at home. -Continue home Abilify   Anxiety Patient received dose of  Vistaril earlier today.  Takes 50 mg TID at home. -Continue Vistaril 50 mg TID -Avoid use of Benzodiazapenes   Chronic Pain Taking Gabapentin 600 mg TID at and Robaxin at home. -Continue Gabapentin 600 mg TID -Hold Robaxin    Tobacco Use Reports using half pack per day.  Currently on 21mg  patch. - Decrease to 14mg  patch this evening   FEN/GI: NPO for possible procedure PPx: None currently for possible procedures  Disposition: Med-Surg  Prior to Admission Living Arrangement: Home Anticipated Discharge Location: Home Barriers to Discharge: Abscess  Anticipated discharge in approximately 1-3 day(s).   Subjective:  Interviewed patient at bedside.  She was very pleasant. She reported only mild pain on raising her left arm. Abscess area is not tender to palpation. Discussed Gen Surg and IR will see her this morning to determine appropriate course going forward. Confirmed when her methadone is given and have moved the schedule up. Pharmacy called her clinic to confirm dosing.   Objective: Temp:  [98.5 F (36.9 C)] 98.5 F (36.9 C) (10/01 0145) Pulse Rate:  [66-94] 85 (10/01 0145) Resp:  [15-17] 16 (09/30 2257) BP: (130-167)/(94-105) 157/100 (10/01 0145) SpO2:  [97 %-100 %] 99 % (10/01 0145) Weight:  [61.2 kg] 61.2 kg (09/30 1527) Physical Exam:  Physical Exam Vitals and nursing note reviewed.  Constitutional:      General: She is not in acute distress.    Appearance: Normal appearance. She is normal weight. She is not ill-appearing, toxic-appearing or diaphoretic.  Cardiovascular:     Rate and Rhythm: Normal rate and regular rhythm.     Pulses: Normal pulses.  Radial pulses are 2+ on the right side and 2+ on the left side.       Dorsalis pedis pulses are 2+ on the right side and 2+ on the left side.     Heart sounds: Normal heart sounds, S1 normal and S2 normal. No murmur heard.   Pulmonary:     Effort: Pulmonary effort is normal. No respiratory distress.      Breath sounds: Wheezing (mild chronic) present.  Abdominal:     General: There is no distension.     Palpations: There is no mass.     Tenderness: There is no abdominal tenderness.  Musculoskeletal:       Arms:     Right lower leg: No edema.     Left lower leg: No edema.     Comments: Swelling present on Left inner arm approximately 3 inches distal from shoulder joint. Not tender or warm on palpation, no erythema present.   Neurological:     Mental Status: She is alert. Mental status is at baseline.  Psychiatric:        Mood and Affect: Mood normal.        Behavior: Behavior normal.        Thought Content: Thought content normal.      Laboratory: Recent Labs  Lab 08/02/20 1451 08/03/20 0427  WBC 8.0 6.7  HGB 12.4 12.2  HCT 38.1 37.1  PLT 190 185   Recent Labs  Lab 08/02/20 1451 08/03/20 0427  NA 134* 136  K 3.7 4.0  CL 99 102  CO2 24 25  BUN 7 7  CREATININE 0.63 0.60  CALCIUM 9.2 9.2  GLUCOSE 78 81    Lactic Acid: 1.0   Imaging/Diagnostic Tests:   CT Humerus Left W Contrast: 2.3 x 1.8 x 5.6 cm rim enhancing fluid collection in the medial aspect of the proximal triceps muscle, consistent with abscess. Soft tissue gas in the neurovascular bundle in the middle and distal third of the arm, extending down to the region of the elbow. Given the history of recent injection in this region, this could be secondary to needle injection. Gas from infection is also a consideration. Gas seems to be fairly well contained in the neurovascular bundle making necrotizing fasciitis less likely but not excluded. Lymphadenopathy in the left axilla and visualized portion of the subpectoral space, likely reactive. Follow-up may be warranted to ensure resolution.   Lauro Franklin, MD 08/03/2020, 6:30 AM PGY-1, [facility] Family [facility] FPTS Intern pager: [phone], text pages welcome

## 2020-08-04 DIAGNOSIS — L02419 Cutaneous abscess of limb, unspecified: Secondary | ICD-10-CM

## 2020-08-04 DIAGNOSIS — L0291 Cutaneous abscess, unspecified: Secondary | ICD-10-CM

## 2020-08-04 DIAGNOSIS — F1910 Other psychoactive substance abuse, uncomplicated: Secondary | ICD-10-CM

## 2020-08-04 LAB — BASIC METABOLIC PANEL
Anion gap: 13 (ref 5–15)
BUN: 9 mg/dL (ref 6–20)
CO2: 20 mmol/L — ABNORMAL LOW (ref 22–32)
Calcium: 8.6 mg/dL — ABNORMAL LOW (ref 8.9–10.3)
Chloride: 98 mmol/L (ref 98–111)
Creatinine, Ser: 0.69 mg/dL (ref 0.44–1.00)
GFR calc Af Amer: >60 mL/min (ref >60)
GFR calc non Af Amer: >60 mL/min (ref >60)
Glucose, Bld: 115 mg/dL — ABNORMAL HIGH (ref 70–99)
Potassium: 3.8 mmol/L (ref 3.5–5.1)
Sodium: 131 mmol/L — ABNORMAL LOW (ref 135–145)

## 2020-08-04 MED ORDER — DOXYCYCLINE HYCLATE 100 MG PO TABS: 100.0000 mg | ORAL_TABLET | Freq: Two times a day (BID) | ORAL | 0 refills | Status: DC

## 2020-08-04 MED ORDER — DOXYCYCLINE HYCLATE 100 MG PO TABS
100.0000 mg | ORAL_TABLET | Freq: Two times a day (BID) | ORAL | Status: DC
  Administered 2020-08-04: 100 mg via ORAL
  Filled 2020-08-04: qty 1

## 2020-08-04 MED ORDER — DOXYCYCLINE HYCLATE 100 MG PO TABS: 100.0000 mg | ORAL_TABLET | Freq: Two times a day (BID) | ORAL | 0 refills | Status: AC

## 2020-08-04 MED ORDER — ENOXAPARIN SODIUM 40 MG/0.4ML ~~LOC~~ SOLN
40.0000 mg | SUBCUTANEOUS | Status: DC
  Administered 2020-08-04: 40 mg via SUBCUTANEOUS
  Filled 2020-08-04: qty 0.4

## 2020-08-04 NOTE — Progress Notes (Addendum)
Discharge summary packet provided to pt with instructions. Pt verbalized understanding of instrucitons. Discussed with pt  Regarding wound care management, Pt has no complaints. D/C to home as ordered.

## 2020-08-04 NOTE — Progress Notes (Signed)
Spoke with Dr. Loreta Ave with IR regarding pt's procedure. States IR performed a needle aspiration of LUE abscess. No barriers for discharge.  Will discharge patient today.   Katha Cabal, DO PGY-2, [facility] 08/04/2020 1:35 PM

## 2020-08-04 NOTE — Progress Notes (Signed)
Pt husband was found hiding in bathroom of pts room after visiting hours. Pt was advised that husband must leave as per [facility] visiting hours policy. Security was called to escort husband out of hospital as husband still in pt room even after being told that visiting hours over.  Burning smell also was noticed by Clinical research associate and both pt and husband were in the bathroom when writer went to check on pt. Pt was noticeably less alert and appeared to be under the influence of something. Writer and charge nurse suspects that husband of pt is giving pt something, possibly illegal substance while admitted here in the hospital.

## 2020-08-04 NOTE — Plan of Care (Signed)
  Problem: Education: Goal: Knowledge of General Education information will improve Description: Including pain rating scale, medication(s)/side effects and non-pharmacologic comfort measures Outcome: Progressing   Problem: Health Behavior/Discharge Planning: Goal: Ability to manage health-related needs will improve Outcome: Progressing   Problem: Clinical Measurements: Goal: Will remain free from infection Outcome: Progressing   

## 2020-08-04 NOTE — Care Management (Signed)
Patient states that she is able to pay cash for prescription at [facility] (approx $20 with [employer] ).  Offered patient cheaper coupons at other places, but she prefers to keep at [facility].

## 2020-08-04 NOTE — Progress Notes (Addendum)
Prescription issue has been  addressed, per pt she will take a bus ride to home.Pt alert/oriented in no apparent distress. No complaints.

## 2020-08-05 NOTE — Procedures (Signed)
Interventional Radiology Procedure Note  Procedure: Image guided aspirate of LUE fluid collection.  Sample of purulent material sent for culture.  Complications: None  EBL: None Sample: Culture sent  Recommendations: - Routine wound care - follow up Cx    Signed,  Yvone Neu. Loreta Ave, DO

## 2020-08-06 ENCOUNTER — Telehealth: Payer: Self-pay | Admitting: Family Medicine

## 2020-08-06 MED ORDER — CEPHALEXIN 500 MG PO CAPS: 500.0000 mg | ORAL_CAPSULE | Freq: Four times a day (QID) | ORAL | 0 refills | Status: AC

## 2020-08-06 NOTE — Telephone Encounter (Signed)
Called patient regarding culture results. Need to change antibiotics to cephalexin from doxycycline (decided in conjunction with ID pharmacist Sharin Mons) . Patient reports arm continues to improve.  Advised of need to stop present antibiotic and change to cephalexin. Rx sent to her pharmacy.  Patient appreciative.  Latrelle Dodrill, MD

## 2020-08-07 LAB — CULTURE, BLOOD (ROUTINE X 2)
Culture: NO GROWTH
Special Requests: ADEQUATE

## 2020-08-08 LAB — AEROBIC/ANAEROBIC CULTURE W GRAM STAIN (SURGICAL/DEEP WOUND)

## 2020-08-09 NOTE — Discharge Summary (Addendum)
Family Medicine Teaching [facility] Discharge Summary  Patient name: Wendy Douglas Medical record number: [id number] Date of birth: 08/18/1976 Age: 44 y.o. Gender: female Date of Admission: 08/01/2020  Date of Discharge:08/04/20 Admitting Physician: Latrelle Dodrill, MD  Primary Care Provider: Patient, No Pcp Per Consultants: Gen Surg, IR  Indication for Hospitalization: Left upper arm abscess   Discharge Diagnoses/Problem List:  Abscess  Disposition: Home  Discharge Condition: Stable  Discharge Exam:   Temp:  [98.6 F (37 C)-99.3 F (37.4 C)] 98.6 F (37 C) (10/02 0418) Pulse Rate:  [67-81] 67 (10/02 0418) Resp:  [16-19] 16 (10/02 0418) BP: (95-142)/(51-79) 95/51 (10/02 0418) SpO2:  [98 %-100 %] 98 % (10/02 0418)  Physical Exam: General: alert, NAD Cardiovascular: RRR. No murmurs  Respiratory: bilateral wheezes. Normal WOB  Abdomen: soft, non distended Extremities: warm, dry  Derm: Incision site clean and intact, covered with a bandaid. No drainage appreciated Area around site is warm without edema. Left medial and distal arm edematous.  Brief Hospital Course:   Carine Russellis a 44 y.o.femalepresenting with left upper arm swelling. PMH is significant forasthma, COPD, IV drug use, MRSA infection2017.   Abscess of upper left extremity  Patient with left arm mass likely secondary to injection with infectious abscess. IV antibiotics started in ED. CT of humerusshowedfluid collection in the medialaspect of the proximal triceps muscle. BMP and CBC on admission were within normal limits. Patient was transitioned to oral Doxycycline 100mg . Image guided aspiration of the abscess was performed by IR on 10/1. She received Tylenol 650mg  every 6 hours as needed for pain. Patient tolerated the procedure well, and remained afebrile and hemodynamically stable. Once culture resulted patient was called and switched to cephalexin.   Home medications were continued for  other chronic diseases which remained stable.    Issues for Follow Up:   1. Follow up with PCP in 2-3 days  2. Continue antibiotics as prescribed  3. Return if you begin to spike fevers, have drainage of the incision site, or other concerning symptoms for infection   Significant Procedures:  Image guided aspiration of LUE abscess  Significant Labs and Imaging:  Recent Labs  Lab 08/03/20 0427  WBC 6.7  HGB 12.2  HCT 37.1  PLT 185   Recent Labs  Lab 08/03/20 0427 08/04/20 0311  NA 136 131*  K 4.0 3.8  CL 102 98  CO2 25 20*  GLUCOSE 81 115*  BUN 7 9  CREATININE 0.60 0.69  CALCIUM 9.2 8.6*    Results/Tests Pending at Time of Discharge: wound culture   Discharge Medications:  Allergies as of 08/04/2020      Reactions   Bee Venom Shortness Of Breath, Swelling   Sulfa Antibiotics Rash      Medication List    TAKE these medications   albuterol 108 (90 Base) MCG/ACT inhaler Commonly known as: VENTOLIN HFA Inhale 1-2 puffs into the lungs every 6 (six) hours as needed for wheezing or shortness of breath.   ARIPiprazole 10 MG tablet Commonly known as: ABILIFY Take 10 mg by mouth daily.   doxycycline 100 MG tablet Commonly known as: VIBRA-TABS Take 1 tablet (100 mg total) by mouth every 12 (twelve) hours for 8 days.   gabapentin 600 MG tablet Commonly known as: NEURONTIN Take 600 mg by mouth 3 (three) times daily.   hydrOXYzine 50 MG tablet Commonly known as: ATARAX/VISTARIL Take 50 mg by mouth 3 (three) times daily.   methadone 10 MG tablet Commonly known as:  DOLOPHINE Take 120 mg by mouth daily.   methocarbamol 500 MG tablet Commonly known as: ROBAXIN Take 500 mg by mouth 3 (three) times daily.            Discharge Care Instructions  (From admission, onward)         Start     Ordered   08/04/20 0000  If the dressing is still on your incision site when you go home, remove it on the third day after your surgery date. Remove dressing if it begins  to fall off, or if it is dirty or damaged before the third day.        08/04/20 1521          Discharge Instructions: Please refer to Patient Instructions section of EMR for full details.  Patient was counseled important signs and symptoms that should prompt return to medical care, changes in medications, dietary instructions, activity restrictions, and follow up appointments.   Follow-Up Appointments:  Follow-up Information    [facility] Follow up on 08/27/2020.   Why: 10 am with Georgian Co Contact information: [address] [place] [address] [phone]              Cora Collum, DO 08/09/2020, 6:23 PM PGY-1, [facility]  Katha Cabal, DO PGY-2, [facility] 08/10/2020 7:26 AM

## 2020-08-27 ENCOUNTER — Encounter: Payer: Self-pay | Admitting: Family

## 2020-08-27 NOTE — Progress Notes (Signed)
Patient did not show for appointment.   

## 2020-12-10 ENCOUNTER — Encounter (HOSPITAL_COMMUNITY): Payer: Self-pay | Admitting: Emergency Medicine

## 2020-12-10 ENCOUNTER — Inpatient Hospital Stay (HOSPITAL_COMMUNITY)
Admission: EM | Admit: 2020-12-10 | Discharge: 2020-12-15 | DRG: 854 | Disposition: A | Discharge status: Home Health | Payer: Self-pay | Attending: Internal Medicine | Admitting: Internal Medicine

## 2020-12-10 DIAGNOSIS — Z79899 Other long term (current) drug therapy: Secondary | ICD-10-CM

## 2020-12-10 DIAGNOSIS — J449 Chronic obstructive pulmonary disease, unspecified: Secondary | ICD-10-CM | POA: Diagnosis present

## 2020-12-10 DIAGNOSIS — F1910 Other psychoactive substance abuse, uncomplicated: Secondary | ICD-10-CM | POA: Diagnosis present

## 2020-12-10 DIAGNOSIS — D638 Anemia in other chronic diseases classified elsewhere: Secondary | ICD-10-CM | POA: Diagnosis present

## 2020-12-10 DIAGNOSIS — L0291 Cutaneous abscess, unspecified: Secondary | ICD-10-CM

## 2020-12-10 DIAGNOSIS — A419 Sepsis, unspecified organism: Secondary | ICD-10-CM | POA: Diagnosis present

## 2020-12-10 DIAGNOSIS — B182 Chronic viral hepatitis C: Secondary | ICD-10-CM | POA: Diagnosis present

## 2020-12-10 DIAGNOSIS — F1990 Other psychoactive substance use, unspecified, uncomplicated: Secondary | ICD-10-CM | POA: Diagnosis present

## 2020-12-10 DIAGNOSIS — F1410 Cocaine abuse, uncomplicated: Secondary | ICD-10-CM | POA: Diagnosis present

## 2020-12-10 DIAGNOSIS — F1510 Other stimulant abuse, uncomplicated: Secondary | ICD-10-CM | POA: Diagnosis present

## 2020-12-10 DIAGNOSIS — L03113 Cellulitis of right upper limb: Secondary | ICD-10-CM | POA: Diagnosis present

## 2020-12-10 DIAGNOSIS — L02413 Cutaneous abscess of right upper limb: Secondary | ICD-10-CM | POA: Diagnosis present

## 2020-12-10 DIAGNOSIS — F17210 Nicotine dependence, cigarettes, uncomplicated: Secondary | ICD-10-CM | POA: Diagnosis present

## 2020-12-10 DIAGNOSIS — Z8614 Personal history of Methicillin resistant Staphylococcus aureus infection: Secondary | ICD-10-CM

## 2020-12-10 DIAGNOSIS — Z820 Family history of epilepsy and other diseases of the nervous system: Secondary | ICD-10-CM

## 2020-12-10 DIAGNOSIS — Z882 Allergy status to sulfonamides status: Secondary | ICD-10-CM

## 2020-12-10 DIAGNOSIS — Z20822 Contact with and (suspected) exposure to covid-19: Secondary | ICD-10-CM | POA: Diagnosis present

## 2020-12-10 DIAGNOSIS — F419 Anxiety disorder, unspecified: Secondary | ICD-10-CM | POA: Diagnosis present

## 2020-12-10 DIAGNOSIS — L02419 Cutaneous abscess of limb, unspecified: Secondary | ICD-10-CM | POA: Diagnosis present

## 2020-12-10 DIAGNOSIS — A408 Other streptococcal sepsis: Principal | ICD-10-CM | POA: Diagnosis present

## 2020-12-10 DIAGNOSIS — F1120 Opioid dependence, uncomplicated: Secondary | ICD-10-CM | POA: Diagnosis present

## 2020-12-10 DIAGNOSIS — L03119 Cellulitis of unspecified part of limb: Secondary | ICD-10-CM | POA: Diagnosis present

## 2020-12-10 DIAGNOSIS — F319 Bipolar disorder, unspecified: Secondary | ICD-10-CM | POA: Diagnosis present

## 2020-12-10 LAB — CBC WITH DIFFERENTIAL/PLATELET
Abs Immature Granulocytes: 0.03 10*3/uL (ref 0.00–0.07)
Basophils Absolute: 0 10*3/uL (ref 0.0–0.1)
Basophils Relative: 0 %
Eosinophils Absolute: 0.1 10*3/uL (ref 0.0–0.5)
Eosinophils Relative: 1 %
HCT: 34 % — ABNORMAL LOW (ref 36.0–46.0)
Hemoglobin: 11.5 g/dL — ABNORMAL LOW (ref 12.0–15.0)
Immature Granulocytes: 0 %
Lymphocytes Relative: 16 %
Lymphs Abs: 1.9 10*3/uL (ref 0.7–4.0)
MCH: 31.9 pg (ref 26.0–34.0)
MCHC: 33.8 g/dL (ref 30.0–36.0)
MCV: 94.2 fL (ref 80.0–100.0)
Monocytes Absolute: 0.7 10*3/uL (ref 0.1–1.0)
Monocytes Relative: 6 %
Neutro Abs: 8.6 10*3/uL — ABNORMAL HIGH (ref 1.7–7.7)
Neutrophils Relative %: 77 %
Platelets: 192 10*3/uL (ref 150–400)
RBC: 3.61 MIL/uL — ABNORMAL LOW (ref 3.87–5.11)
RDW: 13.2 % (ref 11.5–15.5)
WBC: 11.3 10*3/uL — ABNORMAL HIGH (ref 4.0–10.5)
nRBC: 0 % (ref 0.0–0.2)

## 2020-12-10 LAB — LACTIC ACID, PLASMA: Lactic Acid, Venous: 1.3 mmol/L (ref 0.5–1.9)

## 2020-12-10 LAB — COMPREHENSIVE METABOLIC PANEL
ALT: 24 U/L (ref 0–44)
AST: 50 U/L — ABNORMAL HIGH (ref 15–41)
Albumin: 2.9 g/dL — ABNORMAL LOW (ref 3.5–5.0)
Alkaline Phosphatase: 102 U/L (ref 38–126)
Anion gap: 12 (ref 5–15)
BUN: 10 mg/dL (ref 6–20)
CO2: 26 mmol/L (ref 22–32)
Calcium: 8.9 mg/dL (ref 8.9–10.3)
Chloride: 100 mmol/L (ref 98–111)
Creatinine, Ser: 0.69 mg/dL (ref 0.44–1.00)
GFR, Estimated: >60 mL/min (ref >60)
Glucose, Bld: 110 mg/dL — ABNORMAL HIGH (ref 70–99)
Potassium: 4.6 mmol/L (ref 3.5–5.1)
Sodium: 138 mmol/L (ref 135–145)
Total Bilirubin: 0.6 mg/dL (ref 0.3–1.2)
Total Protein: 7.4 g/dL (ref 6.5–8.1)

## 2020-12-10 MED ORDER — ACETAMINOPHEN 325 MG PO TABS
650.0000 mg | ORAL_TABLET | Freq: Once | ORAL | Status: AC | PRN
  Administered 2020-12-10: 14:00:00 650 mg via ORAL
  Filled 2020-12-10: qty 2

## 2020-12-10 NOTE — ED Notes (Signed)
ED Provider at bedside. 

## 2020-12-10 NOTE — ED Triage Notes (Addendum)
Pt reports abscess to R axilla since Saturday night with some drainage. Pt reports she circled the area and redness has spread outside of initial boundary that she marked. Reports hx of drug use, has not inject for 1.5 months.

## 2020-12-11 ENCOUNTER — Encounter (HOSPITAL_COMMUNITY)
Admission: EM | Disposition: A | Discharge status: Home Health | Payer: Self-pay | Source: Home / Self Care | Attending: Internal Medicine

## 2020-12-11 ENCOUNTER — Encounter (HOSPITAL_COMMUNITY): Payer: Self-pay | Admitting: Internal Medicine

## 2020-12-11 ENCOUNTER — Emergency Department (HOSPITAL_COMMUNITY): Payer: Self-pay

## 2020-12-11 ENCOUNTER — Inpatient Hospital Stay (HOSPITAL_COMMUNITY): Payer: Self-pay | Admitting: Certified Registered"

## 2020-12-11 DIAGNOSIS — L03119 Cellulitis of unspecified part of limb: Secondary | ICD-10-CM | POA: Diagnosis present

## 2020-12-11 DIAGNOSIS — A419 Sepsis, unspecified organism: Secondary | ICD-10-CM | POA: Diagnosis present

## 2020-12-11 DIAGNOSIS — F1990 Other psychoactive substance use, unspecified, uncomplicated: Secondary | ICD-10-CM

## 2020-12-11 DIAGNOSIS — L02419 Cutaneous abscess of limb, unspecified: Secondary | ICD-10-CM

## 2020-12-11 DIAGNOSIS — F1910 Other psychoactive substance abuse, uncomplicated: Secondary | ICD-10-CM

## 2020-12-11 DIAGNOSIS — B182 Chronic viral hepatitis C: Secondary | ICD-10-CM

## 2020-12-11 DIAGNOSIS — F1120 Opioid dependence, uncomplicated: Secondary | ICD-10-CM

## 2020-12-11 HISTORY — PX: I & D EXTREMITY: SHX5045

## 2020-12-11 LAB — CBC
HCT: 30.8 % — ABNORMAL LOW (ref 36.0–46.0)
Hemoglobin: 10.3 g/dL — ABNORMAL LOW (ref 12.0–15.0)
MCH: 31.3 pg (ref 26.0–34.0)
MCHC: 33.4 g/dL (ref 30.0–36.0)
MCV: 93.6 fL (ref 80.0–100.0)
Platelets: 192 10*3/uL (ref 150–400)
RBC: 3.29 MIL/uL — ABNORMAL LOW (ref 3.87–5.11)
RDW: 13.4 % (ref 11.5–15.5)
WBC: 7.8 10*3/uL (ref 4.0–10.5)
nRBC: 0 % (ref 0.0–0.2)

## 2020-12-11 LAB — SARS CORONAVIRUS 2 BY RT PCR (HOSPITAL ORDER, PERFORMED IN ~~LOC~~ HOSPITAL LAB): SARS Coronavirus 2: NEGATIVE

## 2020-12-11 LAB — BASIC METABOLIC PANEL
Anion gap: 7 (ref 5–15)
BUN: 7 mg/dL (ref 6–20)
CO2: 28 mmol/L (ref 22–32)
Calcium: 8.4 mg/dL — ABNORMAL LOW (ref 8.9–10.3)
Chloride: 100 mmol/L (ref 98–111)
Creatinine, Ser: 0.6 mg/dL (ref 0.44–1.00)
GFR, Estimated: >60 mL/min (ref >60)
Glucose, Bld: 92 mg/dL (ref 70–99)
Potassium: 3.5 mmol/L (ref 3.5–5.1)
Sodium: 135 mmol/L (ref 135–145)

## 2020-12-11 LAB — SURGICAL PCR SCREEN
MRSA, PCR: NEGATIVE
Staphylococcus aureus: NEGATIVE

## 2020-12-11 LAB — HCG, SERUM, QUALITATIVE: Preg, Serum: NEGATIVE

## 2020-12-11 LAB — LACTIC ACID, PLASMA: Lactic Acid, Venous: 1.1 mmol/L (ref 0.5–1.9)

## 2020-12-11 LAB — HIV ANTIBODY (ROUTINE TESTING W REFLEX): HIV Screen 4th Generation wRfx: NONREACTIVE

## 2020-12-11 SURGERY — IRRIGATION AND DEBRIDEMENT EXTREMITY
Anesthesia: General | Site: Arm Lower | Laterality: Right

## 2020-12-11 MED ORDER — VANCOMYCIN HCL 1000 MG/200ML IV SOLN
1000.0000 mg | INTRAVENOUS | Status: DC
  Filled 2020-12-11: qty 200

## 2020-12-11 MED ORDER — FENTANYL CITRATE (PF) 100 MCG/2ML IJ SOLN
25.0000 ug | INTRAMUSCULAR | Status: DC | PRN
  Administered 2020-12-11: 100 ug via INTRAVENOUS

## 2020-12-11 MED ORDER — MORPHINE SULFATE (PF) 2 MG/ML IV SOLN: 2.0000 mg | INTRAVENOUS | Status: DC | PRN

## 2020-12-11 MED ORDER — ONDANSETRON HCL 4 MG/2ML IJ SOLN: 4.0000 mg | Freq: Four times a day (QID) | INTRAMUSCULAR | Status: DC | PRN

## 2020-12-11 MED ORDER — MIDAZOLAM HCL 5 MG/5ML IJ SOLN
INTRAMUSCULAR | Status: DC | PRN
  Administered 2020-12-11: 2 mg via INTRAVENOUS

## 2020-12-11 MED ORDER — SODIUM CHLORIDE 0.9 % IV BOLUS
1000.0000 mL | Freq: Once | INTRAVENOUS | Status: AC
  Administered 2020-12-11: 1000 mL via INTRAVENOUS

## 2020-12-11 MED ORDER — DEXAMETHASONE SODIUM PHOSPHATE 10 MG/ML IJ SOLN
INTRAMUSCULAR | Status: AC
  Filled 2020-12-11: qty 1

## 2020-12-11 MED ORDER — METHOCARBAMOL 500 MG PO TABS
750.0000 mg | ORAL_TABLET | Freq: Three times a day (TID) | ORAL | Status: DC
  Administered 2020-12-11 – 2020-12-15 (×13): 750 mg via ORAL
  Filled 2020-12-11 (×13): qty 2

## 2020-12-11 MED ORDER — AMITRIPTYLINE HCL 50 MG PO TABS
25.0000 mg | ORAL_TABLET | Freq: Every day | ORAL | Status: DC
  Administered 2020-12-11 – 2020-12-14 (×4): 25 mg via ORAL
  Filled 2020-12-11 (×4): qty 1

## 2020-12-11 MED ORDER — HYDROMORPHONE HCL 1 MG/ML IJ SOLN
0.5000 mg | INTRAMUSCULAR | Status: DC | PRN
Start: 2020-12-11 — End: 2020-12-12
  Filled 2020-12-11: qty 1

## 2020-12-11 MED ORDER — SODIUM CHLORIDE 0.9 % IR SOLN
Status: DC | PRN
  Administered 2020-12-11: 1000 mL

## 2020-12-11 MED ORDER — ACETAMINOPHEN 325 MG PO TABS
325.0000 mg | ORAL_TABLET | Freq: Four times a day (QID) | ORAL | Status: DC | PRN
  Administered 2020-12-14: 650 mg via ORAL
  Filled 2020-12-11: qty 2

## 2020-12-11 MED ORDER — SODIUM CHLORIDE 0.9% FLUSH
10.0000 mL | Freq: Two times a day (BID) | INTRAVENOUS | Status: DC
  Administered 2020-12-11 – 2020-12-12 (×2): 10 mL

## 2020-12-11 MED ORDER — METHADONE HCL 5 MG PO TABS
125.0000 mg | ORAL_TABLET | Freq: Every day | ORAL | Status: DC
  Administered 2020-12-11 – 2020-12-15 (×5): 125 mg via ORAL
  Filled 2020-12-11: qty 1
  Filled 2020-12-11 (×2): qty 12
  Filled 2020-12-11: qty 1
  Filled 2020-12-11: qty 12

## 2020-12-11 MED ORDER — ACETAMINOPHEN 500 MG PO TABS
1000.0000 mg | ORAL_TABLET | Freq: Four times a day (QID) | ORAL | Status: DC
  Administered 2020-12-11 – 2020-12-12 (×3): 1000 mg via ORAL
  Filled 2020-12-11 (×3): qty 2

## 2020-12-11 MED ORDER — MIDAZOLAM HCL 2 MG/2ML IJ SOLN
INTRAMUSCULAR | Status: AC
  Filled 2020-12-11: qty 2

## 2020-12-11 MED ORDER — DEXMEDETOMIDINE HCL IN NACL 80 MCG/20ML IV SOLN
INTRAVENOUS | Status: AC
  Filled 2020-12-11: qty 20

## 2020-12-11 MED ORDER — VANCOMYCIN HCL IN DEXTROSE 1-5 GM/200ML-% IV SOLN
1000.0000 mg | Freq: Two times a day (BID) | INTRAVENOUS | Status: DC
  Administered 2020-12-11 – 2020-12-12 (×2): 1000 mg via INTRAVENOUS
  Filled 2020-12-11 (×3): qty 200

## 2020-12-11 MED ORDER — FENTANYL CITRATE (PF) 250 MCG/5ML IJ SOLN
INTRAMUSCULAR | Status: AC
  Filled 2020-12-11: qty 5

## 2020-12-11 MED ORDER — PROPOFOL 10 MG/ML IV BOLUS
INTRAVENOUS | Status: DC | PRN
  Administered 2020-12-11: 160 mg via INTRAVENOUS

## 2020-12-11 MED ORDER — ONDANSETRON HCL 4 MG PO TABS: 4.0000 mg | ORAL_TABLET | Freq: Four times a day (QID) | ORAL | Status: DC | PRN

## 2020-12-11 MED ORDER — FENTANYL CITRATE (PF) 100 MCG/2ML IJ SOLN
INTRAMUSCULAR | Status: AC
  Filled 2020-12-11: qty 2

## 2020-12-11 MED ORDER — ARIPIPRAZOLE 2 MG PO TABS
12.0000 mg | ORAL_TABLET | Freq: Every day | ORAL | Status: DC
  Administered 2020-12-12 – 2020-12-15 (×4): 12 mg via ORAL
  Filled 2020-12-11 (×5): qty 1

## 2020-12-11 MED ORDER — ALBUTEROL SULFATE HFA 108 (90 BASE) MCG/ACT IN AERS: 1.0000 | INHALATION_SPRAY | Freq: Four times a day (QID) | RESPIRATORY_TRACT | Status: DC | PRN

## 2020-12-11 MED ORDER — VANCOMYCIN HCL IN DEXTROSE 1-5 GM/200ML-% IV SOLN
1000.0000 mg | Freq: Two times a day (BID) | INTRAVENOUS | Status: DC
  Filled 2020-12-11: qty 200

## 2020-12-11 MED ORDER — OXYCODONE HCL 5 MG PO TABS
5.0000 mg | ORAL_TABLET | ORAL | Status: DC | PRN
  Administered 2020-12-13 – 2020-12-15 (×4): 10 mg via ORAL
  Filled 2020-12-11 (×4): qty 2

## 2020-12-11 MED ORDER — POVIDONE-IODINE 10 % EX SWAB: 2.0000 "application " | Freq: Once | CUTANEOUS | Status: DC

## 2020-12-11 MED ORDER — CHLORHEXIDINE GLUCONATE 4 % EX LIQD: 60.0000 mL | Freq: Once | CUTANEOUS | Status: DC

## 2020-12-11 MED ORDER — SUCCINYLCHOLINE CHLORIDE 200 MG/10ML IV SOSY
PREFILLED_SYRINGE | INTRAVENOUS | Status: DC | PRN
  Administered 2020-12-11: 140 mg via INTRAVENOUS

## 2020-12-11 MED ORDER — GABAPENTIN 300 MG PO CAPS
600.0000 mg | ORAL_CAPSULE | Freq: Three times a day (TID) | ORAL | Status: DC
  Administered 2020-12-11 – 2020-12-13 (×6): 600 mg via ORAL
  Filled 2020-12-11 (×6): qty 2

## 2020-12-11 MED ORDER — VANCOMYCIN HCL 1250 MG/250ML IV SOLN
1250.0000 mg | Freq: Once | INTRAVENOUS | Status: AC
  Administered 2020-12-11: 1250 mg via INTRAVENOUS
  Filled 2020-12-11: qty 250

## 2020-12-11 MED ORDER — SODIUM CHLORIDE 0.9% FLUSH
10.0000 mL | INTRAVENOUS | Status: DC | PRN
Start: 2020-12-11 — End: 2020-12-15

## 2020-12-11 MED ORDER — OXYCODONE HCL 5 MG PO TABS
ORAL_TABLET | ORAL | Status: AC
  Filled 2020-12-11: qty 3

## 2020-12-11 MED ORDER — CHLORHEXIDINE GLUCONATE 0.12 % MT SOLN: 15.0000 mL | Freq: Once | OROMUCOSAL | Status: AC

## 2020-12-11 MED ORDER — LACTATED RINGERS IV SOLN: INTRAVENOUS | Status: DC

## 2020-12-11 MED ORDER — SODIUM CHLORIDE 0.9 % IR SOLN
Status: DC | PRN
  Administered 2020-12-11: 3000 mL
  Administered 2020-12-11: 6000 mL

## 2020-12-11 MED ORDER — MOMETASONE FURO-FORMOTEROL FUM 200-5 MCG/ACT IN AERO
2.0000 | INHALATION_SPRAY | Freq: Two times a day (BID) | RESPIRATORY_TRACT | Status: DC
  Administered 2020-12-12 – 2020-12-15 (×6): 2 via RESPIRATORY_TRACT
  Filled 2020-12-11 (×2): qty 8.8

## 2020-12-11 MED ORDER — ONDANSETRON HCL 4 MG/2ML IJ SOLN: 4.0000 mg | Freq: Once | INTRAMUSCULAR | Status: DC | PRN

## 2020-12-11 MED ORDER — HYDROXYZINE HCL 25 MG PO TABS
50.0000 mg | ORAL_TABLET | Freq: Three times a day (TID) | ORAL | Status: DC
  Administered 2020-12-11 – 2020-12-15 (×13): 50 mg via ORAL
  Filled 2020-12-11 (×13): qty 2

## 2020-12-11 MED ORDER — PIPERACILLIN-TAZOBACTAM 3.375 G IVPB 30 MIN
3.3750 g | INTRAVENOUS | Status: DC
  Filled 2020-12-11: qty 50

## 2020-12-11 MED ORDER — CHLORHEXIDINE GLUCONATE 0.12 % MT SOLN
OROMUCOSAL | Status: AC
  Administered 2020-12-11: 15 mL via OROMUCOSAL
  Filled 2020-12-11: qty 15

## 2020-12-11 MED ORDER — DEXAMETHASONE SODIUM PHOSPHATE 10 MG/ML IJ SOLN
INTRAMUSCULAR | Status: DC | PRN
  Administered 2020-12-11: 10 mg via INTRAVENOUS

## 2020-12-11 MED ORDER — ACETAMINOPHEN 325 MG PO TABS: 650.0000 mg | ORAL_TABLET | Freq: Four times a day (QID) | ORAL | Status: DC | PRN

## 2020-12-11 MED ORDER — ONDANSETRON HCL 4 MG/2ML IJ SOLN
INTRAMUSCULAR | Status: AC
  Filled 2020-12-11: qty 2

## 2020-12-11 MED ORDER — PIPERACILLIN-TAZOBACTAM 3.375 G IVPB: 3.3750 g | Freq: Three times a day (TID) | INTRAVENOUS | Status: DC

## 2020-12-11 MED ORDER — ARIPIPRAZOLE 10 MG PO TABS: 10.0000 mg | ORAL_TABLET | Freq: Every day | ORAL | Status: DC

## 2020-12-11 MED ORDER — SODIUM CHLORIDE 0.9 % IV SOLN
3.0000 g | Freq: Four times a day (QID) | INTRAVENOUS | Status: DC
  Administered 2020-12-11 – 2020-12-12 (×3): 3 g via INTRAVENOUS
  Filled 2020-12-11 (×6): qty 8

## 2020-12-11 MED ORDER — ARIPIPRAZOLE 2 MG PO TABS: 2.0000 mg | ORAL_TABLET | Freq: Every day | ORAL | Status: DC

## 2020-12-11 MED ORDER — PIPERACILLIN-TAZOBACTAM 3.375 G IVPB 30 MIN
3.3750 g | Freq: Once | INTRAVENOUS | Status: AC
  Administered 2020-12-11: 3.375 g via INTRAVENOUS

## 2020-12-11 MED ORDER — CITALOPRAM HYDROBROMIDE 20 MG PO TABS
10.0000 mg | ORAL_TABLET | Freq: Every day | ORAL | Status: DC
  Administered 2020-12-11 – 2020-12-15 (×5): 10 mg via ORAL
  Filled 2020-12-11 (×5): qty 1

## 2020-12-11 MED ORDER — OXYCODONE HCL 5 MG PO TABS
10.0000 mg | ORAL_TABLET | ORAL | Status: DC | PRN
  Administered 2020-12-11 – 2020-12-13 (×8): 15 mg via ORAL
  Administered 2020-12-13: 10 mg via ORAL
  Administered 2020-12-13: 15 mg via ORAL
  Administered 2020-12-15: 10 mg via ORAL
  Filled 2020-12-11 (×8): qty 3
  Filled 2020-12-11 (×2): qty 2

## 2020-12-11 MED ORDER — ATOMOXETINE HCL 40 MG PO CAPS
40.0000 mg | ORAL_CAPSULE | Freq: Every day | ORAL | Status: DC
  Administered 2020-12-12 – 2020-12-15 (×4): 40 mg via ORAL
  Filled 2020-12-11 (×5): qty 1

## 2020-12-11 MED ORDER — FENTANYL CITRATE (PF) 250 MCG/5ML IJ SOLN
INTRAMUSCULAR | Status: DC | PRN
  Administered 2020-12-11: 100 ug via INTRAVENOUS

## 2020-12-11 SURGICAL SUPPLY — 52 items
BNDG COHESIVE 4X5 TAN STRL (GAUZE/BANDAGES/DRESSINGS) ×2 IMPLANT
BNDG COHESIVE 6X5 TAN STRL LF (GAUZE/BANDAGES/DRESSINGS) ×4 IMPLANT
BNDG ELASTIC 3X5.8 VLCR STR LF (GAUZE/BANDAGES/DRESSINGS) IMPLANT
BNDG ELASTIC 4X5.8 VLCR STR LF (GAUZE/BANDAGES/DRESSINGS) ×2 IMPLANT
BNDG GAUZE ELAST 4 BULKY (GAUZE/BANDAGES/DRESSINGS) ×4 IMPLANT
COVER SURGICAL LIGHT HANDLE (MISCELLANEOUS) ×2 IMPLANT
COVER WAND RF STERILE (DRAPES) IMPLANT
CUFF TOURN SGL QUICK 24 (TOURNIQUET CUFF)
CUFF TOURN SGL QUICK 34 (TOURNIQUET CUFF)
CUFF TOURN SGL QUICK 42 (TOURNIQUET CUFF) IMPLANT
CUFF TRNQT CYL 24X4X16.5-23 (TOURNIQUET CUFF) IMPLANT
CUFF TRNQT CYL 34X4.125X (TOURNIQUET CUFF) IMPLANT
DRAIN PENROSE 1/4X12 LTX STRL (WOUND CARE) ×2 IMPLANT
DRAPE ORTHO SPLIT 77X108 STRL (DRAPES) ×2
DRAPE SURG 17X23 STRL (DRAPES) ×2 IMPLANT
DRAPE SURG ORHT 6 SPLT 77X108 (DRAPES) ×2 IMPLANT
DRAPE U-SHAPE 47X51 STRL (DRAPES) ×2 IMPLANT
DRSG PAD ABDOMINAL 8X10 ST (GAUZE/BANDAGES/DRESSINGS) ×4 IMPLANT
DURAPREP 26ML APPLICATOR (WOUND CARE) ×2 IMPLANT
ELECT REM PT RETURN 9FT ADLT (ELECTROSURGICAL)
ELECTRODE REM PT RTRN 9FT ADLT (ELECTROSURGICAL) IMPLANT
GAUZE SPONGE 4X4 12PLY STRL (GAUZE/BANDAGES/DRESSINGS) ×4 IMPLANT
GAUZE XEROFORM 5X9 LF (GAUZE/BANDAGES/DRESSINGS) IMPLANT
GLOVE BIOGEL PI IND STRL 7.0 (GLOVE) ×1 IMPLANT
GLOVE BIOGEL PI INDICATOR 7.0 (GLOVE) ×1
GLOVE ECLIPSE 7.0 STRL STRAW (GLOVE) ×2 IMPLANT
GLOVE SKINSENSE NS SZ7.5 (GLOVE) ×2
GLOVE SKINSENSE STRL SZ7.5 (GLOVE) ×2 IMPLANT
GOWN STRL REIN XL XLG (GOWN DISPOSABLE) ×4 IMPLANT
HANDPIECE INTERPULSE COAX TIP (DISPOSABLE)
KIT BASIN OR (CUSTOM PROCEDURE TRAY) ×2 IMPLANT
KIT TURNOVER KIT B (KITS) ×2 IMPLANT
MANIFOLD NEPTUNE II (INSTRUMENTS) ×2 IMPLANT
PACK ORTHO EXTREMITY (CUSTOM PROCEDURE TRAY) ×2 IMPLANT
PAD ARMBOARD 7.5X6 YLW CONV (MISCELLANEOUS) ×4 IMPLANT
PADDING CAST ABS 4INX4YD NS (CAST SUPPLIES)
PADDING CAST ABS COTTON 4X4 ST (CAST SUPPLIES) IMPLANT
PADDING CAST COTTON 6X4 STRL (CAST SUPPLIES) IMPLANT
SET CYSTO W/LG BORE CLAMP LF (SET/KITS/TRAYS/PACK) ×2 IMPLANT
SET HNDPC FAN SPRY TIP SCT (DISPOSABLE) IMPLANT
SPONGE LAP 18X18 RF (DISPOSABLE) ×4 IMPLANT
STOCKINETTE IMPERVIOUS 9X36 MD (GAUZE/BANDAGES/DRESSINGS) ×2 IMPLANT
SUT ETHILON 2 0 FS 18 (SUTURE) ×2 IMPLANT
SUT ETHILON 2 0 PSLX (SUTURE) IMPLANT
SUT VIC AB 2-0 FS1 27 (SUTURE) ×4 IMPLANT
SWAB CULTURE ESWAB REG 1ML (MISCELLANEOUS) IMPLANT
TOWEL GREEN STERILE (TOWEL DISPOSABLE) ×2 IMPLANT
TOWEL GREEN STERILE FF (TOWEL DISPOSABLE) ×2 IMPLANT
TUBE CONNECTING 12X1/4 (SUCTIONS) ×2 IMPLANT
UNDERPAD 30X36 HEAVY ABSORB (UNDERPADS AND DIAPERS) ×4 IMPLANT
WATER STERILE IRR 1000ML POUR (IV SOLUTION) ×2 IMPLANT
YANKAUER SUCT BULB TIP NO VENT (SUCTIONS) ×2 IMPLANT
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
## 2020-12-11 NOTE — Anesthesia Preprocedure Evaluation (Addendum)
Anesthesia Evaluation  Patient identified by MRN, date of birth, ID band Patient awake    Reviewed: Allergy & Precautions, NPO status , Patient's Chart, lab work & pertinent test results  Airway Mallampati: II  TM Distance: >3 FB Neck ROM: Full    Dental  (+) Edentulous Upper, Poor Dentition, Dental Advisory Given   Pulmonary Current Smoker,    breath sounds clear to auscultation       Cardiovascular  Rhythm:Regular Rate:Normal     Neuro/Psych    GI/Hepatic   Endo/Other    Renal/GU      Musculoskeletal   Abdominal   Peds  Hematology   Anesthesia Other Findings   Reproductive/Obstetrics                            Anesthesia Physical Anesthesia Plan  ASA: III  Anesthesia Plan: General   Post-op Pain Management:    Induction: Intravenous  PONV Risk Score and Plan:   Airway Management Planned: Oral ETT  Additional Equipment:   Intra-op Plan:   Post-operative Plan:   Informed Consent: I have reviewed the patients History and Physical, chart, labs and discussed the procedure including the risks, benefits and alternatives for the proposed anesthesia with the patient or authorized representative who has indicated his/her understanding and acceptance.     Dental advisory given  Plan Discussed with: CRNA and Anesthesiologist  Anesthesia Plan Comments:         Anesthesia Quick Evaluation

## 2020-12-11 NOTE — ED Provider Notes (Signed)
MSE was initiated and I personally evaluated the patient and placed orders (if any) at  12:02 AM on December 11, 2020.  Patient is a 45 year old female presenting today with complaint of right arm pain.  She states that she is an IV drug user last injected meth and cocaine last night because of pain in her right arm.  She states that she has not injected into her right arm for approximately 2 months.  She has abscess in her right bicep which is painful, swollen, red.  She states has become more hot and painful over the past few days.  She states that her symptoms been present for 5 days and progressively worsening.  She states that she has had fevers and chills.  Some nausea.  No vomiting.  CONSTITUTIONAL:  well-appearing, NAD NEURO:  Alert and oriented x 3, no focal deficits EYES:  pupils equal and reactive ENT/NECK:  trachea midline, no JVD CARDIO:  reg rate, reg rhythm, well-perfused PULM:  None labored breathing GI/GU:  Abdomen non-distended MSK/SPINE:  No gross deformities, there is large area of erythema and warmth with fluctuance in the right biceps in the proximal area just distal to the axilla.  She has good distal pulses but does have significant right arm edema. SKIN:  no rash obvious, atraumatic, no ecchymosis  PSYCH:  Appropriate speech and behavior       Discussed case with my attending physician Dr. Wilkie Aye. Dr. Wilkie Aye and I both discussed case with charge nurse We will attempt to place patient in ER bed as soon as one is available.  Will likely need IV antibiotics; disposition pending at this time.  X-ray of arm obtained I suspect there will be air evident given that patient's abscess is producing air bubbles as well as purulence.    The patient appears stable so that the remainder of the MSE may be completed by another provider.   Solon Augusta [place], [place] 12/11/20 0134    Shon Baton, MD 12/11/20 [phone]

## 2020-12-11 NOTE — Progress Notes (Signed)
[facility] Progress Note  Patient: Wendy Douglas    INO:[id number]  DOA: 12/10/2020     Date of Service: the patient was seen and examined on 12/11/2020  Brief hospital course: Past medical history of IVDU with heroin cocaine and meth, on methadone chronically.  Presents with complaints of right arm infection. Underwent debridement. Currently plan is IV antibiotics and follow-up on ID and Ortho recommendation.  Assessment and Plan: 1.  Cellulitis and abscess of the right arm IV drug abuse Last injected meth and cocaine last night although did not use right arm. Presents with painful right arm ongoing for last 5 days. X-ray shows gas formation and abscess. Underwent excisional debridement of right upper arm. Twice A wet-to-dry dressing to the open wound. Currently have a Penrose drain which needs to be removed in couple of days. Elevate upper extremity. ID consultation for antibiotic. Currently on IV vancomycin and Unasyn.  2.  Chronic opiate dependence Patient is regular patient of [facility] on [address]. Verified that the patient is on 125 mg of methadone dose. Continue resume.  3.  Anemia of chronic disease Likely in the setting of infection. Expecting postop drop as well. We will monitor.  4.  LFT elevation. Mild.  Likely secondary to severe infection.  Monitor.  Diet: Regular diet DVT Prophylaxis:   SCD's Start: 12/11/20 1228 SCDs Start: 12/11/20 0316    Advance goals of care discussion: Full code  Family Communication: no family was present at bedside, at the time of interview.   Disposition:  Status is: Inpatient  Remains inpatient appropriate because:IV treatments appropriate due to intensity of illness or inability to take PO   Dispo: The patient is from: Home              Anticipated d/c is to: Home              Anticipated d/c date is: > 3 days              Patient currently is not medically stable to d/c.   Difficult to place  patient No        Subjective: No nausea no vomiting.  Reports pain in the right upper extremity.  No fever no chills.  Physical Exam:  General: Appear in mild distress, no Rash; Oral Mucosa Clear, moist. no Abnormal Neck Mass Or lumps, Conjunctiva normal  Cardiovascular: S1 and S2 Present, no Murmur, Respiratory: good respiratory effort, Bilateral Air entry present and CTA, no Crackles, no wheezes Abdomen: Bowel Sound present, Soft and no tenderness Extremities: no Pedal edema, right upper extremity wrapped. Neurology: alert and oriented to time, place, and person affect appropriate. no new focal deficit Gait not checked due to patient safety concerns    Vitals:   12/11/20 1556 12/11/20 1626 12/11/20 1653 12/11/20 1704  BP: 114/61 (!) 117/56  (!) 115/56  Pulse: (!) 51 (!) 50  (!) 52  Resp: 12 (!) 8  14  Temp:   97.9 F (36.6 C) 97.8 F (36.6 C)  TempSrc:    Oral  SpO2: 99% 95%  97%    Intake/Output Summary (Last 24 hours) at 12/11/2020 1804 Last data filed at 12/11/2020 1227 Gross per 24 hour  Intake 1200 ml  Output 20 ml  Net 1180 ml   There were no vitals filed for this visit.  Data Reviewed: I have personally reviewed and interpreted daily labs, tele strips, imaging. I reviewed all nursing notes, pharmacy notes, vitals, pertinent old records  I have discussed plan of care as described above with RN and patient/family.  CBC: Recent Labs  Lab 12/10/20 1340 12/11/20 0524  WBC 11.3* 7.8  NEUTROABS 8.6*  --   HGB 11.5* 10.3*  HCT 34.0* 30.8*  MCV 94.2 93.6  PLT 192 192   Basic Metabolic Panel: Recent Labs  Lab 12/10/20 1340 12/11/20 0524  NA 138 135  K 4.6 3.5  CL 100 100  CO2 26 28  GLUCOSE 110* 92  BUN 10 7  CREATININE 0.69 0.60  CALCIUM 8.9 8.4*    Studies: DG Humerus Right  Result Date: 12/11/2020 CLINICAL DATA:  Arm abscess.  Concern for osteomyelitis EXAM: RIGHT HUMERUS - 2+ VIEW COMPARISON:  None. FINDINGS: Gas within the medial soft tissues  of the right upper arm and axilla. No radiopaque foreign bodies. No acute bony abnormality. No bone destruction. IMPRESSION: Gas within the soft tissues in the medial upper arm/axilla. No evidence of osteomyelitis. Electronically Signed   By: Charlett Nose M.D.   On: 12/11/2020 00:37    Scheduled Meds: . acetaminophen  1,000 mg Oral Q6H  . amitriptyline  25 mg Oral QHS  . ARIPiprazole  12 mg Oral Daily  . atomoxetine  40 mg Oral Daily  . citalopram  10 mg Oral Daily  . dexmedetomidine      . fentaNYL      . gabapentin  600 mg Oral TID  . hydrOXYzine  50 mg Oral TID  . methadone  125 mg Oral Daily  . methocarbamol  750 mg Oral TID  . mometasone-formoterol  2 puff Inhalation BID  . oxyCODONE      . sodium chloride flush  10-40 mL Intracatheter Q12H   Continuous Infusions: . ampicillin-sulbactam (UNASYN) IV 3 g (12/11/20 1752)  . lactated ringers 100 mL/hr at 12/11/20 1122  . [START ON 12/12/2020] vancomycin     PRN Meds: [START ON 12/12/2020] acetaminophen, acetaminophen, albuterol, HYDROmorphone (DILAUDID) injection, morphine injection, ondansetron **OR** ondansetron (ZOFRAN) IV, oxyCODONE, oxyCODONE, sodium chloride flush  Time spent: 35 minutes  Author: Lynden Oxford, MD [facility] 12/11/2020 6:04 PM  To reach On-call, see care teams to locate the attending and reach out via [email or web address]. Between 7PM-7AM, please contact night-coverage If you still have difficulty reaching the attending provider, please page the [facility] (Director on Call) for [facility] on amion for assistance.

## 2020-12-11 NOTE — Transfer of Care (Signed)
Immediate Anesthesia Transfer of Care Note  Patient: Wendy Douglas  Procedure(s) Performed: IRRIGATION AND DEBRIDEMENT EXTREMITY (Right Arm Lower)  Patient Location: PACU  Anesthesia Type:General  Level of Consciousness: awake, alert , oriented and patient cooperative  Airway & Oxygen Therapy: Patient Spontanous Breathing and Patient connected to face mask oxygen  Post-op Assessment: Report given to RN, Post -op Vital signs reviewed and stable and Patient moving all extremities  Post vital signs: Reviewed and stable  Last Vitals:  Vitals Value Taken Time  BP    Temp 36.4 C 12/11/20 1226  Pulse 62 12/11/20 1226  Resp 15 12/11/20 1226  SpO2 95 % 12/11/20 1226    Last Pain:  Vitals:   12/10/20 2301  TempSrc: Oral         Complications: No complications documented.

## 2020-12-11 NOTE — Progress Notes (Signed)
Attempted to obtain urine preg test on arrival to Short Stay. Patient states she is unable to provide specimen. Patient denies possibility of pregnancy test. Dr. Noreene Larsson made aware.

## 2020-12-11 NOTE — Progress Notes (Signed)
 precedex by dr Noreene Larsson mda

## 2020-12-11 NOTE — Anesthesia Procedure Notes (Signed)
Procedure Name: Intubation Date/Time: 12/11/2020 11:32 AM Performed by: Myna Bright, CRNA Pre-anesthesia Checklist: Emergency Drugs available, Patient identified, Suction available and Patient being monitored Patient Re-evaluated:Patient Re-evaluated prior to induction Oxygen Delivery Method: Circle system utilized Preoxygenation: Pre-oxygenation with 100% oxygen Induction Type: IV induction, Rapid sequence and Cricoid Pressure applied Laryngoscope Size: Mac and 3 Grade View: Grade I Tube type: Oral Tube size: 7.0 mm Number of attempts: 1 Airway Equipment and Method: Stylet Placement Confirmation: ETT inserted through vocal cords under direct vision,  positive ETCO2 and breath sounds checked- equal and bilateral Secured at: 21 cm Tube secured with: Tape Dental Injury: Teeth and Oropharynx as per pre-operative assessment

## 2020-12-11 NOTE — Consult Note (Signed)
Reason for Consult:RUE abscess Referring Physician: Laban Emperor Time called: 0730 Time at bedside: 0848   Wendy Douglas is an 45 y.o. female.  HPI: Wendy Douglas began to develop redness and swelling on her right biceps on Thursday of last week. Over the next few days the redness got worse and at some point it popped and started draining. She has a hx/o IVDU but was clean for a month and a half until Sunday when she used again. She admits to fevers, chills and nausea. She has had similar abscesses before. 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  Past Medical History:  Diagnosis Date  . Abscess   . Anxiety   . Asthma    IN CHILDHOOD  . Bipolar disorder (HCC)   . Chronic narcotic dependence (HCC)   . COPD (chronic obstructive pulmonary disease) (HCC)   . Depression   . IVDU (intravenous drug user)   . MRSA (methicillin resistant Staphylococcus aureus)   . Pneumonia "several times"   (07/15/2016)  . Shortness of breath   . Smoker     Past Surgical History:  Procedure Laterality Date  . I & D EXTREMITY Left 07/15/2016   Procedure: IRRIGATION AND DEBRIDEMENT LEFT HAND WITH TENOSYNOVECTOMY;  Surgeon: Dominica Severin, MD;  Location: [facility] OR;  Service: Orthopedics;  Laterality: Left;  . INCISION AND DRAINAGE ABSCESS Left 07/15/2016   between the fourth and fifth fingers on the dorsum of the hand   . LAPAROSCOPIC CHOLECYSTECTOMY    . TUBAL LIGATION      Family History  Problem Relation Age of Onset  . Multiple sclerosis Mother   . Other Father        Suicide    Social History:  reports that she has been smoking cigarettes. She has a 12.50 pack-year smoking history. She has never used smokeless tobacco. She reports current alcohol use of about 6.0 standard drinks of alcohol per week. She reports current drug use. Drugs: IV, Cocaine, and Marijuana.  Allergies:  Allergies  Allergen Reactions  . Bee Venom Shortness Of Breath and Swelling  . Sulfa Antibiotics Rash     Medications: I have reviewed the patient's current medications.  Results for orders placed or performed during the hospital encounter of 12/10/20 (from the past 48 hour(s))  Lactic acid, plasma     Status: None   Collection Time: 12/10/20  1:40 PM  Result Value Ref Range   Lactic Acid, Venous 1.3 0.5 - 1.9 mmol/L    Comment: Performed at [facility], [address], [place], [place] [address]  Comprehensive metabolic panel     Status: Abnormal   Collection Time: 12/10/20  1:40 PM  Result Value Ref Range   Sodium 138 135 - 145 mmol/L   Potassium 4.6 3.5 - 5.1 mmol/L   Chloride 100 98 - 111 mmol/L   CO2 26 22 - 32 mmol/L   Glucose, Bld 110 (H) 70 - 99 mg/dL    Comment: Glucose reference range applies only to samples taken after fasting for at least 8 hours.   BUN 10 6 - 20 mg/dL   Creatinine, Ser 1.42 0.44 - 1.00 mg/dL   Calcium 8.9 8.9 - 39.5 mg/dL   Total Protein 7.4 6.5 - 8.1 g/dL   Albumin 2.9 (L) 3.5 - 5.0 g/dL   AST 50 (H) 15 - 41 U/L   ALT 24 0 - 44 U/L   Alkaline Phosphatase 102 38 - 126 U/L  Total Bilirubin 0.6 0.3 - 1.2 mg/dL   GFR, Estimated >65 >68 mL/min    Comment: (NOTE) Calculated using the CKD-EPI Creatinine Equation (2021)    Anion gap 12 5 - 15    Comment: Performed at [facility], [address] [place], [place] [address]  CBC with Differential     Status: Abnormal   Collection Time: 12/10/20  1:40 PM  Result Value Ref Range   WBC 11.3 (H) 4.0 - 10.5 K/uL   RBC 3.61 (L) 3.87 - 5.11 MIL/uL   Hemoglobin 11.5 (L) 12.0 - 15.0 g/dL   HCT 70.0 (L) 17.4 - 94.4 %   MCV 94.2 80.0 - 100.0 fL   MCH 31.9 26.0 - 34.0 pg   MCHC 33.8 30.0 - 36.0 g/dL   RDW 96.7 59.1 - 63.8 %   Platelets 192 150 - 400 K/uL   nRBC 0.0 0.0 - 0.2 %   Neutrophils Relative % 77 %   Neutro Abs 8.6 (H) 1.7 - 7.7 K/uL   Lymphocytes Relative 16 %   Lymphs Abs 1.9 0.7 - 4.0 K/uL   Monocytes Relative 6 %   Monocytes Absolute 0.7 0.1 - 1.0 K/uL   Eosinophils Relative 1 %    Eosinophils Absolute 0.1 0.0 - 0.5 K/uL   Basophils Relative 0 %   Basophils Absolute 0.0 0.0 - 0.1 K/uL   Immature Granulocytes 0 %   Abs Immature Granulocytes 0.03 0.00 - 0.07 K/uL    Comment: Performed at [facility], [address], [place], [place] [address]  Aerobic Culture (superficial specimen)     Status: None (Preliminary result)   Collection Time: 12/11/20  3:06 AM   Specimen: Abscess  Result Value Ref Range   Specimen Description ABSCESS    Special Requests NONE    Gram Stain      FEW WBC PRESENT, PREDOMINANTLY PMN FEW GRAM POSITIVE COCCI IN CLUSTERS RARE GRAM NEGATIVE RODS Performed at [facility], [address], [place], [place] [address]    Culture PENDING    Report Status PENDING   SARS Coronavirus 2 by RT PCR (hospital order, performed in [facility] hospital lab) Nasopharyngeal Nasopharyngeal Swab     Status: None   Collection Time: 12/11/20  4:31 AM   Specimen: Nasopharyngeal Swab  Result Value Ref Range   SARS Coronavirus 2 NEGATIVE NEGATIVE    Comment: (NOTE) SARS-CoV-2 target nucleic acids are NOT DETECTED.  The SARS-CoV-2 RNA is generally detectable in upper and lower respiratory specimens during the acute phase of infection. The lowest concentration of SARS-CoV-2 viral copies this assay can detect is 250 copies / mL. A negative result does not preclude SARS-CoV-2 infection and should not be used as the sole basis for treatment or other patient management decisions.  A negative result may occur with improper specimen collection / handling, submission of specimen other than nasopharyngeal swab, presence of viral mutation(s) within the areas targeted by this assay, and inadequate number of viral copies (<250 copies / mL). A negative result must be combined with clinical observations, patient history, and epidemiological information.  Fact Sheet for Patients:   BoilerBrush.com.cy  Fact Sheet for Healthcare  Providers: https://pope.com/  This test is not yet approved or  cleared by the [place] FDA and has been authorized for detection and/or diagnosis of SARS-CoV-2 by FDA under an Emergency Use Authorization (EUA).  This EUA will remain in effect (meaning this test can be used) for the duration  of the COVID-19 declaration under Section 564(b)(1) of the Act, 21 U.S.C. section 360bbb-3(b)(1), unless the authorization is terminated or revoked sooner.  Performed at [facility], [address] [place], [place] [address]   CBC     Status: Abnormal   Collection Time: 12/11/20  5:24 AM  Result Value Ref Range   WBC 7.8 4.0 - 10.5 K/uL   RBC 3.29 (L) 3.87 - 5.11 MIL/uL   Hemoglobin 10.3 (L) 12.0 - 15.0 g/dL   HCT 26.9 (L) 48.5 - 46.2 %   MCV 93.6 80.0 - 100.0 fL   MCH 31.3 26.0 - 34.0 pg   MCHC 33.4 30.0 - 36.0 g/dL   RDW 70.3 50.0 - 93.8 %   Platelets 192 150 - 400 K/uL   nRBC 0.0 0.0 - 0.2 %    Comment: Performed at [facility], [address], [place], [place] [address]  Basic metabolic panel     Status: Abnormal   Collection Time: 12/11/20  5:24 AM  Result Value Ref Range   Sodium 135 135 - 145 mmol/L   Potassium 3.5 3.5 - 5.1 mmol/L   Chloride 100 98 - 111 mmol/L   CO2 28 22 - 32 mmol/L   Glucose, Bld 92 70 - 99 mg/dL    Comment: Glucose reference range applies only to samples taken after fasting for at least 8 hours.   BUN 7 6 - 20 mg/dL   Creatinine, Ser 3.71 0.44 - 1.00 mg/dL   Calcium 8.4 (L) 8.9 - 10.3 mg/dL   GFR, Estimated >69 >67 mL/min    Comment: (NOTE) Calculated using the CKD-EPI Creatinine Equation (2021)    Anion gap 7 5 - 15    Comment: Performed at [facility], [address] [place], [place] [address]  Lactic acid, plasma     Status: None   Collection Time: 12/11/20  5:28 AM  Result Value Ref Range   Lactic Acid, Venous 1.1 0.5 - 1.9 mmol/L    Comment: Performed at [facility], [address]  [place], [place] [address]    DG Humerus Right  Result Date: 12/11/2020 CLINICAL DATA:  Arm abscess.  Concern for osteomyelitis EXAM: RIGHT HUMERUS - 2+ VIEW COMPARISON:  None. FINDINGS: Gas within the medial soft tissues of the right upper arm and axilla. No radiopaque foreign bodies. No acute bony abnormality. No bone destruction. IMPRESSION: Gas within the soft tissues in the medial upper arm/axilla. No evidence of osteomyelitis. Electronically Signed   By: Charlett Nose M.D.   On: 12/11/2020 00:37    Review of Systems  Constitutional: Positive for chills and fever. Negative for diaphoresis.  HENT: Negative for ear discharge, ear pain, hearing loss and tinnitus.   Eyes: Negative for photophobia and pain.  Respiratory: Negative for cough and shortness of breath.   Cardiovascular: Negative for chest pain.  Gastrointestinal: Positive for nausea. Negative for abdominal pain and vomiting.  Genitourinary: Negative for dysuria, flank pain, frequency and urgency.  Musculoskeletal: Positive for arthralgias (Right upper arm). Negative for back pain, myalgias and neck pain.  Neurological: Negative for dizziness and headaches.  Hematological: Does not bruise/bleed easily.  Psychiatric/Behavioral: The patient is not nervous/anxious.    Blood pressure (!) 114/57, pulse 60, temperature 98.1 F (36.7 C), temperature source Oral, resp. rate 15, SpO2 94 %. Physical Exam Constitutional:      General: She is not in acute distress.    Appearance: She is well-developed and  well-nourished. She is not diaphoretic.  HENT:     Head: Normocephalic and atraumatic.  Eyes:     General: No scleral icterus.       Right eye: No discharge.        Left eye: No discharge.     Conjunctiva/sclera: Conjunctivae normal.  Cardiovascular:     Rate and Rhythm: Normal rate and regular rhythm.  Pulmonary:     Effort: Pulmonary effort is normal. No respiratory distress.  Musculoskeletal:     Cervical back: Normal range of  motion.     Comments: Right shoulder, elbow, wrist, digits- Ulceration medial biceps with surrounding erythema, edema, TTP, and purulent discharge, no instability, no blocks to motion  Sens  Ax/R/M/U intact  Mot   Ax/ R/ PIN/ M/ AIN/ U intact  Rad 2+  Skin:    General: Skin is warm and dry.  Neurological:     Mental Status: She is alert.  Psychiatric:        Mood and Affect: Mood and affect normal.        Behavior: Behavior normal.     Assessment/Plan: Right upper arm abscess -- Plan I&D today with Dr. Roda Shutters. Please keep NPO. 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

## 2020-12-11 NOTE — Plan of Care (Signed)

## 2020-12-11 NOTE — Progress Notes (Signed)
Pharmacy Antibiotic Note  Wendy Douglas is a 45 y.o. female admitted on 12/10/2020 with abscess.  Pharmacy has been consulted for vancomycin dosing.  Plan: Vancomycin 1250 mg IV x 1 then  1gm IV q12 hours F/u renal function, cultures and clinical course    Temp (24hrs), Avg:99 F (37.2 C), Min:97.9 F (36.6 C), Max:100.9 F (38.3 C)  Recent Labs  Lab 12/10/20 1340  WBC 11.3*  CREATININE 0.69  LATICACIDVEN 1.3    CrCl cannot be calculated (Unknown ideal weight.).    Allergies  Allergen Reactions  . Bee Venom Shortness Of Breath and Swelling  . Sulfa Antibiotics Rash    Thank you for allowing pharmacy to be a part of this patient's care.  Talbert Cage Poteet 12/11/2020 2:21 AM

## 2020-12-11 NOTE — Anesthesia Postprocedure Evaluation (Signed)
Anesthesia Post Note  Patient: Wendy Douglas  Procedure(s) Performed: IRRIGATION AND DEBRIDEMENT EXTREMITY (Right Arm Lower)     Patient location during evaluation: PACU Anesthesia Type: General Level of consciousness: awake and alert Pain management: pain level controlled Vital Signs Assessment: post-procedure vital signs reviewed and stable Respiratory status: spontaneous breathing, nonlabored ventilation, respiratory function stable and patient connected to nasal cannula oxygen Cardiovascular status: blood pressure returned to baseline and stable Postop Assessment: no apparent nausea or vomiting Anesthetic complications: no   No complications documented.  Last Vitals:  Vitals:   12/11/20 1430 12/11/20 1530  BP:  124/61  Pulse: (!) 51 (!) 51  Resp: (!) 9 11  Temp:    SpO2: 93% 94%    Last Pain:  Vitals:   12/11/20 1530  TempSrc:   PainSc: Asleep                 Hutton Pellicane COKER

## 2020-12-11 NOTE — Progress Notes (Signed)
Pharmacy Antibiotic Note  Wendy Douglas is a 45 y.o. female admitted on 12/10/2020 with abscess.  Pharmacy consulted to manage vancomycin and Zosyn.   WBC has normalized. SCr wnl.   Plan: -Continue vancomycin 1gm IV q12 hours F/u renal function, cultures and clinical course -Add Zosyn 3.375 gm IV Q 8 hours (EI infusion) -Monitor CBC, renal fx, cultures and clinical progress    Temp (24hrs), Avg:99 F (37.2 C), Min:97.9 F (36.6 C), Max:100.9 F (38.3 C)  Recent Labs  Lab 12/10/20 1340 12/11/20 0524 12/11/20 0528  WBC 11.3* 7.8  --   CREATININE 0.69 0.60  --   LATICACIDVEN 1.3  --  1.1    CrCl cannot be calculated (Unknown ideal weight.).    Allergies  Allergen Reactions  . Bee Venom Shortness Of Breath and Swelling  . Sulfa Antibiotics Rash   Zosyn 2/8 >> Vanc 2/7 >>   2/8 BCx >>  2/8 Abscess >> few GPC in clusters, rare GNRs  Thank you for allowing pharmacy to be a part of this patient's care.  Vinnie Level, PharmD., BCPS, BCCCP Clinical Pharmacist Please refer to [facility] for unit-specific pharmacist

## 2020-12-11 NOTE — Op Note (Signed)
   Date of Surgery: 12/11/2020  INDICATIONS: Wendy Douglas is a 45 y.o.-year-old female with a right upper arm abscess from IV drug use.  The patient did consent to the procedure after discussion of the risks and benefits.  PREOPERATIVE DIAGNOSIS: Right upper arm abscess complicated from IV drug use  POSTOPERATIVE DIAGNOSIS: Same.  PROCEDURE:  1.  Excisional debridement of right upper arm complicated abscess 95 cm  SURGEON: N. Glee Arvin, M.D.  ASSIST: Starlyn Skeans [place], [place]; necessary for the timely completion of procedure and due to complexity of procedure.  ANESTHESIA:  general  IV FLUIDS AND URINE: See anesthesia.  ESTIMATED BLOOD LOSS: Minimal mL.  IMPLANTS: None   DRAINS: Penrose drains and wet-to-dry  COMPLICATIONS: see description of procedure.  DESCRIPTION OF PROCEDURE: The patient was brought to the operating room.  The patient had been signed prior to the procedure and this was documented. The patient had the anesthesia placed by the anesthesiologist.  A time-out was performed to confirm that this was the correct patient, site, side and location. The patient did receive antibiotics prior to the incision and was re-dosed during the procedure as needed at indicated intervals.  The patient had the operative extremity prepped and draped in the standard surgical fashion.    We first began with excisional debridement of the open wound of the upper arm with a 15 blade which included the skin and the underlying subcutaneous tissue and the muscle.  There was a scant amount of purulence.  Cultures were obtained.  I then performed a blunt finger dissection circumferentially into the subcutaneous space and into the intramuscular space of the biceps.  I did also worked proximally into the axilla and posteriorly into the triceps.  I do not find any evidence of abscesses.  I then made 2 additional counterincisions over and indurated areas are thought were abscesses but there was no  evidence of frank pus.  Altogether Sharp excisional debridement was performed of the skin, subcutaneous tissue, muscle.  There was tunneling underneath the open wound.  Altogether 95 cm was debrided with a combination of knife and rondure and curette.  9 L of normal saline was then irrigated through all the wounds.  All the wounds were left open.  Penrose drains were placed.  The largest of the wound was packed with wet-to-dry dressings.  Sterile dressings were applied.  Patient tolerated procedure well had no any complications.  Wendy Douglas was necessary for opening, closing, retracting, limb positioning and overall facilitation and timely completion of the procedure.  POSTOPERATIVE PLAN: We will follow the patient clinically for improvement.  She will need twice a day wet-to-dry dressings to the open wound.  We will plan to remove the Penrose drain in a couple days.  She is to keep the arm elevated at all times.  I do recommend an consultation with the infectious disease team for antibiotic treatment and recommendations.  Wendy Reel, MD 2:00 PM

## 2020-12-11 NOTE — Progress Notes (Signed)
Pharmacy Antibiotic Note  Wendy Douglas is a 45 y.o. female admitted on 12/10/2020 with abscess. Now s/p debridement of upper arm abscess. Pharmacy asked to continue vancomycin and switch Zosyn to Unasyn.  WBC has normalized. SCr wnl.   Plan: -Continue vancomycin 1gm IV q12 hours -Stop Zosyn -Start Unasyn 3 gm IV q6 hrs -F/u renal function, cultures and clinical course    Temp (24hrs), Avg:97.8 F (36.6 C), Min:97.6 F (36.4 C), Max:98.1 F (36.7 C)  Recent Labs  Lab 12/10/20 1340 12/11/20 0524 12/11/20 0528  WBC 11.3* 7.8  --   CREATININE 0.69 0.60  --   LATICACIDVEN 1.3  --  1.1    CrCl cannot be calculated (Unknown ideal weight.).    Allergies  Allergen Reactions  . Bee Venom Shortness Of Breath and Swelling  . Sulfa Antibiotics Rash   Zosyn 2/8 Vanc 2/7 >>  Unasyn 2/8>>  2/8 BCx >>  2/8 Abscess >> few GPC in clusters, rare GNRs  Thank you for allowing pharmacy to be a part of this patient's care.   Tama Headings, PharmD, BCPS PGY2 Cardiology Pharmacy Resident Phone: [phone] 12/11/2020  4:49 PM  Please check [email or web address] for unit-specific pharmacy phone numbers.

## 2020-12-11 NOTE — Progress Notes (Signed)
IVT consulted for DBIV for blood cultures.  Unable to collect r/t midline already in place.  Phlebotomy called [phone] and acknowledged. RN made aware.

## 2020-12-11 NOTE — H&P (Signed)
History and Physical    Wendy Douglas ALP:[id number] DOB: Dec 22, 1975 DOA: 12/10/2020  PCP: Patient, No Pcp Per  Patient coming from: Home  I have personally briefly reviewed patient's old medical records in [facility]  Chief Complaint: Abscess  HPI: Wendy Douglas is a 45 y.o. female with medical history significant of IVDU including heroin, cocaine, and meth.  Chronic opiate dependence.  Multiple previous abscesses including MRSA.  Pt presents to ED with c/o abscess in R arm.  Last injected meth and cocaine last night though this wasn't in to R arm.  Abscess in R bicep which is painful, swollen, red.  Symptoms onset 5 days ago, progressively worsening, now severe.  Has had fevers and chills, some nausea.  No vomiting.   ED Course: Draining abscess with gas on X ray.  WBC 11k.  Tm 100.9.  Started on empiric vanc and Dr. Roda Shutters consulted for ortho.   Review of Systems: As per HPI, otherwise all review of systems negative.  Past Medical History:  Diagnosis Date  . Abscess   . Anxiety   . Asthma    IN CHILDHOOD  . Bipolar disorder (HCC)   . Chronic narcotic dependence (HCC)   . COPD (chronic obstructive pulmonary disease) (HCC)   . Depression   . IVDU (intravenous drug user)   . MRSA (methicillin resistant Staphylococcus aureus)   . Pneumonia "several times"   (07/15/2016)  . Shortness of breath   . Smoker     Past Surgical History:  Procedure Laterality Date  . I & D EXTREMITY Left 07/15/2016   Procedure: IRRIGATION AND DEBRIDEMENT LEFT HAND WITH TENOSYNOVECTOMY;  Surgeon: Dominica Severin, MD;  Location: [facility] OR;  Service: Orthopedics;  Laterality: Left;  . INCISION AND DRAINAGE ABSCESS Left 07/15/2016   between the fourth and fifth fingers on the dorsum of the hand   . LAPAROSCOPIC CHOLECYSTECTOMY    . TUBAL LIGATION       reports that she has been smoking cigarettes. She has a 12.50 pack-year smoking history. She has never used smokeless tobacco. She reports  current alcohol use of about 6.0 standard drinks of alcohol per week. She reports current drug use. Drugs: IV, Cocaine, and Marijuana.  Allergies  Allergen Reactions  . Bee Venom Shortness Of Breath and Swelling  . Sulfa Antibiotics Rash    Family History  Problem Relation Age of Onset  . Multiple sclerosis Mother   . Other Father        Suicide     Prior to Admission medications   Medication Sig Start Date End Date Taking? Authorizing Provider  ABILIFY 2 MG tablet Take 2 mg by mouth daily. 11/27/20  Yes [provider]  ADVAIR DISKUS 250-50 MCG/DOSE AEPB Inhale 1 puff into the lungs in the morning and at bedtime. 11/27/20  Yes [provider]  albuterol (PROVENTIL HFA;VENTOLIN HFA) 108 (90 Base) MCG/ACT inhaler Inhale 1-2 puffs into the lungs every 6 (six) hours as needed for wheezing or shortness of breath. 07/22/16  Yes Rai, Ripudeep K, MD  ARIPiprazole (ABILIFY) 10 MG tablet Take 10 mg by mouth daily.   Yes [provider]  CELEXA 10 MG tablet Take 10 mg by mouth daily. 11/27/20  Yes [provider]  gabapentin (NEURONTIN) 600 MG tablet Take 600 mg by mouth 3 (three) times daily.   Yes [provider]  hydrOXYzine (ATARAX/VISTARIL) 50 MG tablet Take 50 mg by mouth 3 (three) times daily.   Yes [provider]  methadone (DOLOPHINE) 10 MG tablet Take 125 mg by mouth daily.   Yes [provider]  methocarbamol (ROBAXIN) 750 MG tablet Take 750 mg by mouth 3 (three) times daily. 11/27/20  Yes [provider]  STRATTERA 40 MG capsule Take 40 mg by mouth daily. 11/27/20  Yes [provider]  amitriptyline (ELAVIL) 25 MG tablet Take 25 mg by mouth at bedtime. 11/27/20   [provider]    Physical Exam: Vitals:   12/10/20 1709 12/10/20 2002 12/10/20 2301 12/11/20 0111  BP: (!) 104/58 (!) 150/139 115/62 (!) 93/57  Pulse: 60 68 69 (!) 57  Resp: 16 17 15 14   Temp:  97.9 F (36.6 C) 98.1 F (36.7 C)    TempSrc:  Oral Oral   SpO2: 98% 99% 97% 95%    Constitutional: NAD, calm, comfortable Eyes: PERRL, lids and conjunctivae normal ENMT: Mucous membranes are moist. Posterior pharynx clear of any exudate or lesions.Normal dentition.  Neck: normal, supple, no masses, no thyromegaly Respiratory: clear to auscultation bilaterally, no wheezing, no crackles. Normal respiratory effort. No accessory muscle use.  Cardiovascular: Regular rate and rhythm, no murmurs / rubs / gallops. No extremity edema. 2+ pedal pulses. No carotid bruits.  Abdomen: no tenderness, no masses palpated. No hepatosplenomegaly. Bowel sounds positive.  Musculoskeletal: no clubbing / cyanosis. No joint deformity upper and lower extremities. Good ROM, no contractures. Normal muscle tone.  Skin:  Neurologic: CN 2-12 grossly intact. Sensation intact, DTR normal. Strength 5/5 in all 4.  Psychiatric: Normal judgment and insight. Alert and oriented x 3. Normal mood.    Labs on Admission: I have personally reviewed following labs and imaging studies  CBC: Recent Labs  Lab 12/10/20 1340  WBC 11.3*  NEUTROABS 8.6*  HGB 11.5*  HCT 34.0*  MCV 94.2  PLT 192   Basic Metabolic Panel: Recent Labs  Lab 12/10/20 1340  NA 138  K 4.6  CL 100  CO2 26  GLUCOSE 110*  BUN 10  CREATININE 0.69  CALCIUM 8.9   GFR: CrCl cannot be calculated (Unknown ideal weight.). Liver Function Tests: Recent Labs  Lab 12/10/20 1340  AST 50*  ALT 24  ALKPHOS 102  BILITOT 0.6  PROT 7.4  ALBUMIN 2.9*   No results for input(s): LIPASE, AMYLASE in the last 168 hours. No results for input(s): AMMONIA in the last 168 hours. Coagulation Profile: No results for input(s): INR, PROTIME in the last 168 hours. Cardiac Enzymes: No results for input(s): CKTOTAL, CKMB, CKMBINDEX, TROPONINI in the last 168 hours. BNP (last 3 results) No results for input(s): PROBNP in the last 8760 hours. HbA1C: No results for input(s): HGBA1C in the last 72  hours. CBG: No results for input(s): GLUCAP in the last 168 hours. Lipid Profile: No results for input(s): CHOL, HDL, LDLCALC, TRIG, CHOLHDL, LDLDIRECT in the last 72 hours. Thyroid Function Tests: No results for input(s): TSH, T4TOTAL, FREET4, T3FREE, THYROIDAB in the last 72 hours. Anemia Panel: No results for input(s): VITAMINB12, FOLATE, FERRITIN, TIBC, IRON, RETICCTPCT in the last 72 hours. Urine analysis:    Component Value Date/Time   COLORURINE COLORLESS (A) 07/15/2016 1020   APPEARANCEUR CLEAR 07/15/2016 1020   LABSPEC <1.005 (L) 07/15/2016 1020   PHURINE 6.0 07/15/2016 1020   GLUCOSEU NEGATIVE 07/15/2016 1020   HGBUR TRACE (A) 07/15/2016 1020   BILIRUBINUR NEGATIVE 07/15/2016 1020   KETONESUR NEGATIVE 07/15/2016 1020   PROTEINUR NEGATIVE 07/15/2016 1020   UROBILINOGEN 0.2 03/22/2010 1131   NITRITE NEGATIVE 07/15/2016 1020  LEUKOCYTESUR LARGE (A) 07/15/2016 1020    Radiological Exams on Admission: DG Humerus Right  Result Date: 12/11/2020 CLINICAL DATA:  Arm abscess.  Concern for osteomyelitis EXAM: RIGHT HUMERUS - 2+ VIEW COMPARISON:  None. FINDINGS: Gas within the medial soft tissues of the right upper arm and axilla. No radiopaque foreign bodies. No acute bony abnormality. No bone destruction. IMPRESSION: Gas within the soft tissues in the medial upper arm/axilla. No evidence of osteomyelitis. Electronically Signed   By: Charlett Nose M.D.   On: 12/11/2020 00:37    EKG: Independently reviewed.  Assessment/Plan Principal Problem:   Cellulitis and abscess of upper extremity Active Problems:   Chronic narcotic dependence (HCC)   Polysubstance abuse (HCC)   Chronic hepatitis C without hepatic coma (HCC)   IVDU (intravenous drug user)   Sepsis (HCC)    1. Cellulitis and abscess of R arm - with sepsis 1. Cellulitis pathway 2. Empiric vanc 3. GS and culture pending 4. Lactate nl 5. IVF: LR at 100, 1L NS bolus in ED 6. Ortho consulted for I+D 7. Morphine PRN  pain 2. Chronic opaite dependence - 1. Resume methadone once dose can be verified with clinic in AM 3. Polysubstance abuse, IVDU, HCV - 1. Chronic and stable  DVT prophylaxis: SCDs Code Status: Full Family Communication: No family in room Disposition Plan: Home after treatment of cellulitis and abscess Consults called: EDP spoke with Dr. Roda Shutters Admission status: Admit to inpatient  Severity of Illness: The appropriate patient status for this patient is INPATIENT. Inpatient status is judged to be reasonable and necessary in order to provide the required intensity of service to ensure the patient's safety. The patient's presenting symptoms, physical exam findings, and initial radiographic and laboratory data in the context of their chronic comorbidities is felt to place them at high risk for further clinical deterioration. Furthermore, it is not anticipated that the patient will be medically stable for discharge from the hospital within 2 midnights of admission. The following factors support the patient status of inpatient.   IP status due to cellulitis and abscess, requiring IV ABx due to sepsis and h/o MRSA, also requiring surgical drainage.   * I certify that at the point of admission it is my clinical judgment that the patient will require inpatient hospital care spanning beyond 2 midnights from the point of admission due to high intensity of service, high risk for further deterioration and high frequency of surveillance required.*    Wendy Douglas M. DO [facility]  How to contact the [facility] Attending or Consulting provider 7A - 7P or covering provider during after hours 7P -7A, for this patient?  1. Check the care team in [facility] and look for a) attending/consulting [facility] provider listed and b) the [facility] team listed 2. Log into [email or web address]  Amion Physician Scheduling and messaging for groups and whole hospitals  On call and physician scheduling software for group practices, residents,  hospitalists and other medical providers for call, clinic, rotation and shift schedules. OnCall [employer] is a hospital-wide system for scheduling doctors and paging doctors on call. EasyPlot is for scientific plotting and data analysis.  [email or web address]  and use [place]'s universal password to access. If you do not have the password, please contact the hospital operator.  3. Locate the [facility] provider you are looking for under [facility] and page to a number that you can be directly reached. 4. If you still have difficulty reaching the provider, please page the [facility] (Director on Call) for  the Hospitalists listed on amion for assistance.  12/11/2020, 3:50 AM

## 2020-12-12 ENCOUNTER — Other Ambulatory Visit: Payer: Self-pay

## 2020-12-12 ENCOUNTER — Encounter (HOSPITAL_COMMUNITY): Payer: Self-pay | Admitting: Orthopaedic Surgery

## 2020-12-12 LAB — CBC
HCT: 28.2 % — ABNORMAL LOW (ref 36.0–46.0)
Hemoglobin: 9 g/dL — ABNORMAL LOW (ref 12.0–15.0)
MCH: 30.4 pg (ref 26.0–34.0)
MCHC: 31.9 g/dL (ref 30.0–36.0)
MCV: 95.3 fL (ref 80.0–100.0)
Platelets: 205 10*3/uL (ref 150–400)
RBC: 2.96 MIL/uL — ABNORMAL LOW (ref 3.87–5.11)
RDW: 13.3 % (ref 11.5–15.5)
WBC: 8.9 10*3/uL (ref 4.0–10.5)
nRBC: 0 % (ref 0.0–0.2)

## 2020-12-12 MED ORDER — VANCOMYCIN HCL 1000 MG/200ML IV SOLN
1000.0000 mg | Freq: Two times a day (BID) | INTRAVENOUS | Status: DC
  Administered 2020-12-12 – 2020-12-14 (×4): 1000 mg via INTRAVENOUS
  Filled 2020-12-12 (×4): qty 200

## 2020-12-12 MED ORDER — SODIUM CHLORIDE 0.9 % IV SOLN
2.0000 g | Freq: Three times a day (TID) | INTRAVENOUS | Status: DC
  Administered 2020-12-12 – 2020-12-14 (×6): 2 g via INTRAVENOUS
  Filled 2020-12-12 (×7): qty 2

## 2020-12-12 MED ORDER — NICOTINE 21 MG/24HR TD PT24
21.0000 mg | MEDICATED_PATCH | Freq: Every day | TRANSDERMAL | Status: DC
  Administered 2020-12-12 – 2020-12-15 (×4): 21 mg via TRANSDERMAL
  Filled 2020-12-12 (×4): qty 1

## 2020-12-12 MED ORDER — HYDROMORPHONE HCL 1 MG/ML IJ SOLN
0.5000 mg | INTRAMUSCULAR | Status: DC | PRN
Start: 2020-12-12 — End: 2020-12-13

## 2020-12-12 MED ORDER — VANCOMYCIN HCL 1000 MG/200ML IV SOLN
1000.0000 mg | Freq: Two times a day (BID) | INTRAVENOUS | Status: DC
  Filled 2020-12-12: qty 200

## 2020-12-12 MED ORDER — NICOTINE POLACRILEX 2 MG MT GUM
2.0000 mg | CHEWING_GUM | OROMUCOSAL | Status: DC | PRN
  Administered 2020-12-12 – 2020-12-14 (×2): 2 mg via ORAL
  Filled 2020-12-12 (×3): qty 1

## 2020-12-12 MED ORDER — ACETAMINOPHEN 500 MG PO TABS
1000.0000 mg | ORAL_TABLET | Freq: Three times a day (TID) | ORAL | Status: DC
  Administered 2020-12-12 – 2020-12-13 (×3): 1000 mg via ORAL
  Filled 2020-12-12 (×3): qty 2

## 2020-12-12 NOTE — Progress Notes (Signed)
[facility] Progress Note  Patient: Wendy Douglas    TFT:[id number]  DOA: 12/10/2020     Date of Service: the patient was seen and examined on 12/12/2020  Brief hospital course: Past medical history of IVDU with heroin cocaine and meth, on methadone chronically.  Presents with complaints of right arm infection. Underwent debridement. Currently plan is IV antibiotics and follow-up on ID and Ortho recommendation.  Assessment and Plan: 1.  Cellulitis and abscess of the right arm IV drug abuse Last injected meth and cocaine last night although did not use right arm. Presents with painful right arm ongoing for last 5 days. X-ray shows gas formation and abscess. Underwent excisional debridement of right upper arm. Twice A wet-to-dry dressing to the open wound. Currently have a Penrose drain which needs to be removed in couple of days.  Had mild oozing from her dressing.  RN will redress. Elevate upper extremity. ID consultation for antibiotic.  Wound culture currently growing gram-positive cocci. Currently on IV vancomycin and Unasyn.  2.  Chronic opiate dependence Patient is regular patient of [facility] on [address]. Verified that the patient is on 125 mg of methadone dose. Continue resume.  3.  Anemia of chronic disease Likely in the setting of infection. Expecting postop drop as well. We will monitor.  4.  LFT elevation. Mild.  Likely secondary to severe infection.  Monitor.  5.  Active smoker. Nicotine patch  Diet: Regular diet DVT Prophylaxis:   SCD's Start: 12/11/20 1228 SCDs Start: 12/11/20 0316   Advance goals of care discussion: Full code  Family Communication: no family was present at bedside, at the time of interview.   Disposition:  Status is: Inpatient  Remains inpatient appropriate because:IV treatments appropriate due to intensity of illness or inability to take PO   Dispo: The patient is from: Home              Anticipated d/c is  to: Home              Anticipated d/c date is: > 3 days              Patient currently is not medically stable to d/c.   Difficult to place patient No  Subjective: No nausea no vomiting.  Verbally abusive to the staff.  No fever no chills.  Physical Exam:  General: Appear in mild clinical distress, no Rash; Oral Mucosa Clear, moist. no Abnormal Neck Mass Or lumps, Conjunctiva normal  Cardiovascular: S1 and S2 Present, no Murmur, Respiratory: good respiratory effort, Bilateral Air entry present and CTA, no Crackles, no wheezes Abdomen: Bowel Sound present, Soft and no tenderness Extremities: no Pedal edema, mild oozing from her right arm dressing. Neurology: alert and oriented to time, place, and person affect appropriate. no new focal deficit Gait not checked due to patient safety concerns    Vitals:   12/12/20 0821 12/12/20 0824 12/12/20 1000 12/12/20 1550  BP:  (!) 146/73  (!) 144/76  Pulse:  65  70  Resp:    17  Temp:  97.6 F (36.4 C)  98.2 F (36.8 C)  TempSrc:  Oral  Oral  SpO2: 97% 97%  99%  Weight:   67.1 kg     Intake/Output Summary (Last 24 hours) at 12/12/2020 1644 Last data filed at 12/12/2020 7062 Gross per 24 hour  Intake 640 ml  Output --  Net 640 ml   Filed Weights   12/12/20 1000  Weight: 67.1 kg  Data Reviewed: I have personally reviewed and interpreted daily labs, tele strips, imaging. I reviewed all nursing notes, pharmacy notes, vitals, pertinent old records I have discussed plan of care as described above with RN and patient/family.  CBC: Recent Labs  Lab 12/10/20 1340 12/11/20 0524 12/12/20 0427  WBC 11.3* 7.8 8.9  NEUTROABS 8.6*  --   --   HGB 11.5* 10.3* 9.0*  HCT 34.0* 30.8* 28.2*  MCV 94.2 93.6 95.3  PLT 192 192 205   Basic Metabolic Panel: Recent Labs  Lab 12/10/20 1340 12/11/20 0524  NA 138 135  K 4.6 3.5  CL 100 100  CO2 26 28  GLUCOSE 110* 92  BUN 10 7  CREATININE 0.69 0.60  CALCIUM 8.9 8.4*    Studies: No  results found.  Scheduled Meds: . acetaminophen  1,000 mg Oral TID  . amitriptyline  25 mg Oral QHS  . ARIPiprazole  12 mg Oral Daily  . atomoxetine  40 mg Oral Daily  . citalopram  10 mg Oral Daily  . gabapentin  600 mg Oral TID  . hydrOXYzine  50 mg Oral TID  . methadone  125 mg Oral Daily  . methocarbamol  750 mg Oral TID  . mometasone-formoterol  2 puff Inhalation BID  . nicotine  21 mg Transdermal Daily  . sodium chloride flush  10-40 mL Intracatheter Q12H   Continuous Infusions: . ceFEPime (MAXIPIME) IV 2 g (12/12/20 1231)  . vancomycin     PRN Meds: acetaminophen, acetaminophen, albuterol, HYDROmorphone (DILAUDID) injection, nicotine polacrilex, ondansetron **OR** ondansetron (ZOFRAN) IV, oxyCODONE, oxyCODONE, sodium chloride flush  Time spent: 35 minutes  Author: Lynden Oxford, MD [facility] 12/12/2020 4:44 PM  To reach On-call, see care teams to locate the attending and reach out via [email or web address]. Between 7PM-7AM, please contact night-coverage If you still have difficulty reaching the attending provider, please page the [facility] (Director on Call) for [facility] on amion for assistance.

## 2020-12-12 NOTE — Plan of Care (Signed)
  Problem: Health Behavior/Discharge Planning: Goal: Ability to manage health-related needs will improve Outcome: Progressing   Problem: Clinical Measurements: Goal: Ability to maintain clinical measurements within normal limits will improve Outcome: Progressing   Problem: Activity: Goal: Risk for activity intolerance will decrease Outcome: Progressing   Problem: Nutrition: Goal: Adequate nutrition will be maintained Outcome: Progressing   Problem: Coping: Goal: Level of anxiety will decrease Outcome: Progressing   Problem: Elimination: Goal: Will not experience complications related to bowel motility Outcome: Progressing   Problem: Pain Managment: Goal: General experience of comfort will improve Outcome: Progressing   

## 2020-12-12 NOTE — Progress Notes (Signed)
Pharmacy Antibiotic Note  Wendy Douglas is a 45 y.o. female admitted on 12/10/2020 with cellulitis.  Pharmacy has been consulted for vancomycin dosing. After consulting with ID physician Dr. Renold Don, restarting vancomycin until MRSA ruled out.   Plan: Vancomycin 1000mg  Q12H restarting at 1600 on 12/12/20  Weight: 67.1 kg (148 lb)  Temp (24hrs), Avg:97.7 F (36.5 C), Min:97.4 F (36.3 C), Max:97.9 F (36.6 C)  Recent Labs  Lab 12/10/20 1340 12/11/20 0524 12/11/20 0528 12/12/20 0427  WBC 11.3* 7.8  --  8.9  CREATININE 0.69 0.60  --   --   LATICACIDVEN 1.3  --  1.1  --     Estimated Creatinine Clearance: 82.3 mL/min (by C-G formula based on SCr of 0.6 mg/dL).     02/09/21, PharmD, MBA Pharmacy Resident [phone] 12/12/2020 2:29 PM

## 2020-12-12 NOTE — Progress Notes (Signed)
Subjective: 1 Day Post-Op Procedure(s) (LRB): IRRIGATION AND DEBRIDEMENT EXTREMITY (Right) Patient reports pain as mild.  Feeling ok this am.    Objective: Vital signs in last 24 hours: Temp:  [97.4 F (36.3 C)-97.9 F (36.6 C)] 97.5 F (36.4 C) (02/09 0421) Pulse Rate:  [47-76] 56 (02/09 0421) Resp:  [8-18] 18 (02/09 0421) BP: (114-143)/(55-92) 143/59 (02/09 0421) SpO2:  [93 %-99 %] 97 % (02/09 0821)  Intake/Output from previous day: 02/08 0701 - 02/09 0700 In: 1340 [P.O.:240; I.V.:700; IV Piggyback:400] Out: 20 [Blood:20] Intake/Output this shift: No intake/output data recorded.  Recent Labs    12/10/20 1340 12/11/20 0524 12/12/20 0427  HGB 11.5* 10.3* 9.0*   Recent Labs    12/11/20 0524 12/12/20 0427  WBC 7.8 8.9  RBC 3.29* 2.96*  HCT 30.8* 28.2*  PLT 192 205   Recent Labs    12/10/20 1340 12/11/20 0524  NA 138 135  K 4.6 3.5  CL 100 100  CO2 26 28  BUN 10 7  CREATININE 0.69 0.60  GLUCOSE 110* 92  CALCIUM 8.9 8.4*   No results for input(s): LABPT, INR in the last 72 hours.  Neurologically intact Neurovascular intact Sensation intact distally Intact pulses distally  Wounds are packed with serous drainage on bandage Entire arm fairly edematous and moderately indurated with increased erythema to upper arm    Assessment/Plan: 1 Day Post-Op Procedure(s) (LRB): IRRIGATION AND DEBRIDEMENT EXTREMITY (Right) Continue wet-to-dry dressing changes twice daily Please have patient elevate RUE as much as possible  Intra-op cx pending Continue iv abx  Will likely pull drain tomorrow or friday       Cristie Hem 12/12/2020, 8:24 AM

## 2020-12-12 NOTE — Consult Note (Signed)
[facility]    Date of Admission:  12/10/2020   Total days of antibiotics 3        Day 3 vancomycin        Day 2 unasyn               Reason for Consult: Right arm cellulitis and abscess    Referring Provider: Dr. Lynden Oxford Primary Care Provider: No PCP  Assessment: Cellulitis and abscess of the right arm IVDU  Plan: 1. Cellulitis and abscess of the right arm- with IVDU. S/p excisional debridement of right upper arm 2/8, currently on a penrose drain. Intraoperative cultures are pending. On IV vancomycin. Switched Unasyn to cefepime.   Principal Problem:   Cellulitis and abscess of upper extremity Active Problems:   Chronic narcotic dependence (HCC)   Polysubstance abuse (HCC)   Chronic hepatitis C without hepatic coma (HCC)   IVDU (intravenous drug user)   Sepsis (HCC)   Scheduled Meds: . acetaminophen  1,000 mg Oral Q6H  . acetaminophen  1,000 mg Oral TID  . amitriptyline  25 mg Oral QHS  . ARIPiprazole  12 mg Oral Daily  . atomoxetine  40 mg Oral Daily  . citalopram  10 mg Oral Daily  . gabapentin  600 mg Oral TID  . hydrOXYzine  50 mg Oral TID  . methadone  125 mg Oral Daily  . methocarbamol  750 mg Oral TID  . mometasone-formoterol  2 puff Inhalation BID  . nicotine  21 mg Transdermal Daily  . sodium chloride flush  10-40 mL Intracatheter Q12H   Continuous Infusions: . ampicillin-sulbactam (UNASYN) IV 3 g (12/12/20 4259)  . lactated ringers 100 mL/hr at 12/11/20 2335  . vancomycin 1,000 mg (12/12/20 0205)   PRN Meds:.acetaminophen, acetaminophen, albuterol, HYDROmorphone (DILAUDID) injection, morphine injection, nicotine polacrilex, ondansetron **OR** ondansetron (ZOFRAN) IV, oxyCODONE, oxyCODONE, sodium chloride flush  HPI: Wendy Douglas is a 45 y.o. female with medical history significant of IVDU including heroin, cocaine, and meth and chronic opiate dependence who presented with a right arm abscess.  She has had  multiple previous abscesses including MRSA. She last injected meth and cocaine the night prior to admission but not in her right arm. She states the pain and swelling started about 5 days ago and the abscess continued to grow before bursting. She had fevers, chills and nausea all which have now improved.    Review of Systems: Review of Systems  Constitutional: Negative for chills, diaphoresis, fever and malaise/fatigue.  Respiratory: Negative for cough and shortness of breath.   Cardiovascular: Negative for chest pain and leg swelling.  Gastrointestinal: Negative for abdominal pain, diarrhea, nausea and vomiting.  Musculoskeletal: Negative for joint pain.  Skin: Negative for itching and rash.  Neurological: Negative for weakness and headaches.    Past Medical History:  Diagnosis Date  . Abscess   . Anxiety   . Asthma    IN CHILDHOOD  . Bipolar disorder (HCC)   . Chronic narcotic dependence (HCC)   . COPD (chronic obstructive pulmonary disease) (HCC)   . Depression   . IVDU (intravenous drug user)   . MRSA (methicillin resistant Staphylococcus aureus)   . Pneumonia "several times"   (07/15/2016)  . Shortness of breath   . Smoker     Social History   Tobacco Use  . Smoking status: Current Every Day Smoker    Packs/day: 0.50    Years: 25.00  Pack years: 12.50    Types: Cigarettes  . Smokeless tobacco: Never Used  Vaping Use  . Vaping Use: Former  Substance Use Topics  . Alcohol use: Yes    Alcohol/week: 6.0 standard drinks    Types: 6 Cans of beer per week  . Drug use: Yes    Types: IV, Cocaine, Marijuana    Comment: heroin and crack; 07/15/2016 "use cocaine qd; smoke marijuana occasionally"    Family History  Problem Relation Age of Onset  . Multiple sclerosis Mother   . Other Father        Suicide   Allergies  Allergen Reactions  . Bee Venom Shortness Of Breath and Swelling  . Sulfa Antibiotics Rash    OBJECTIVE: Blood pressure (!) 146/73, pulse 65,  temperature 97.6 F (36.4 C), temperature source Oral, resp. rate 18, weight 67.1 kg, last menstrual period 11/11/2020, SpO2 97 %.  Physical Exam Vitals reviewed.  Constitutional:      General: She is not in acute distress.    Appearance: Normal appearance. She is not ill-appearing.  HENT:     Head: Normocephalic and atraumatic.  Eyes:     Conjunctiva/sclera: Conjunctivae normal.     Pupils: Pupils are equal, round, and reactive to light.  Cardiovascular:     Rate and Rhythm: Normal rate and regular rhythm.     Heart sounds: Normal heart sounds. No murmur heard. No friction rub. No gallop.   Pulmonary:     Effort: Pulmonary effort is normal. No respiratory distress.     Breath sounds: Normal breath sounds. No wheezing or rales.  Abdominal:     General: Abdomen is flat. Bowel sounds are normal. There is no distension.     Palpations: Abdomen is soft.     Tenderness: There is no abdominal tenderness. There is no guarding.  Musculoskeletal:        General: No swelling or tenderness.     Right lower leg: No edema.     Left lower leg: No edema.  Skin:    General: Skin is warm and dry.     Findings: No lesion or rash.  Neurological:     Mental Status: She is alert and oriented to person, place, and time.  Psychiatric:        Mood and Affect: Mood normal.        Behavior: Behavior normal.        Thought Content: Thought content normal.        Judgment: Judgment normal.     Lab Results Lab Results  Component Value Date   WBC 8.9 12/12/2020   HGB 9.0 (L) 12/12/2020   HCT 28.2 (L) 12/12/2020   MCV 95.3 12/12/2020   PLT 205 12/12/2020    Lab Results  Component Value Date   CREATININE 0.60 12/11/2020   BUN 7 12/11/2020   NA 135 12/11/2020   K 3.5 12/11/2020   CL 100 12/11/2020   CO2 28 12/11/2020    Lab Results  Component Value Date   ALT 24 12/10/2020   AST 50 (H) 12/10/2020   ALKPHOS 102 12/10/2020   BILITOT 0.6 12/10/2020     Microbiology: Recent Results  (from the past 240 hour(s))  Aerobic Culture (superficial specimen)     Status: None (Preliminary result)   Collection Time: 12/11/20  3:06 AM   Specimen: Abscess  Result Value Ref Range Status   Specimen Description ABSCESS  Final   Special Requests NONE  Final   Gram  Stain   Final    FEW WBC PRESENT, PREDOMINANTLY PMN FEW GRAM POSITIVE COCCI IN CLUSTERS RARE GRAM NEGATIVE RODS    Culture   Final    CULTURE REINCUBATED FOR BETTER GROWTH Performed at [facility], [address], [place], [place] [address]    Report Status PENDING  Incomplete  SARS Coronavirus 2 by RT PCR (hospital order, performed in [facility] hospital lab) Nasopharyngeal Nasopharyngeal Swab     Status: None   Collection Time: 12/11/20  4:31 AM   Specimen: Nasopharyngeal Swab  Result Value Ref Range Status   SARS Coronavirus 2 NEGATIVE NEGATIVE Final    Comment: (NOTE) SARS-CoV-2 target nucleic acids are NOT DETECTED.  The SARS-CoV-2 RNA is generally detectable in upper and lower respiratory specimens during the acute phase of infection. The lowest concentration of SARS-CoV-2 viral copies this assay can detect is 250 copies / mL. A negative result does not preclude SARS-CoV-2 infection and should not be used as the sole basis for treatment or other patient management decisions.  A negative result may occur with improper specimen collection / handling, submission of specimen other than nasopharyngeal swab, presence of viral mutation(s) within the areas targeted by this assay, and inadequate number of viral copies (<250 copies / mL). A negative result must be combined with clinical observations, patient history, and epidemiological information.  Fact Sheet for Patients:   BoilerBrush.com.cy  Fact Sheet for Healthcare Providers: https://pope.com/  This test is not yet approved or  cleared by the [place] FDA and has been authorized for detection  and/or diagnosis of SARS-CoV-2 by FDA under an Emergency Use Authorization (EUA).  This EUA will remain in effect (meaning this test can be used) for the duration of the COVID-19 declaration under Section 564(b)(1) of the Act, 21 U.S.C. section 360bbb-3(b)(1), unless the authorization is terminated or revoked sooner.  Performed at [facility], [address] [place], [place] [address]   Blood culture (routine x 2)     Status: None (Preliminary result)   Collection Time: 12/11/20  4:50 AM   Specimen: BLOOD  Result Value Ref Range Status   Specimen Description BLOOD LEFT UPPER ARM  Final   Special Requests   Final    BOTTLES DRAWN AEROBIC AND ANAEROBIC Blood Culture adequate volume   Culture   Final    NO GROWTH 1 DAY Performed at [facility] Lab, [address], [place], [place] [address]    Report Status PENDING  Incomplete  Surgical pcr screen     Status: None   Collection Time: 12/11/20 11:09 AM   Specimen: Nasal Mucosa; Nasal Swab  Result Value Ref Range Status   MRSA, PCR NEGATIVE NEGATIVE Final   Staphylococcus aureus NEGATIVE NEGATIVE Final    Comment: (NOTE) The Xpert SA Assay (FDA approved for NASAL specimens in patients 39 years of age and older), is one component of a comprehensive surveillance program. It is not intended to diagnose infection nor to guide or monitor treatment. Performed at [facility] Lab, [address], [place], [place] [address]   Aerobic/Anaerobic Culture (surgical/deep wound)     Status: None (Preliminary result)   Collection Time: 12/11/20 11:51 AM   Specimen: PATH Other; Body Fluid  Result Value Ref Range Status   Specimen Description ABSCESS  Final   Special Requests RIGHT UPPER ARM SPEC A  Final   Gram Stain   Final    MODERATE WBC PRESENT,BOTH PMN AND MONONUCLEAR NO  ORGANISMS SEEN    Culture   Final    NO GROWTH < 24 HOURS Performed at [facility], [address], [place], [place] [address]    Report Status  PENDING  Incomplete    Nolton Denis Elaina Pattee, DO PGY-2 [facility] 12/12/2020, 10:23 AM

## 2020-12-13 DIAGNOSIS — L02413 Cutaneous abscess of right upper limb: Secondary | ICD-10-CM

## 2020-12-13 DIAGNOSIS — L03113 Cellulitis of right upper limb: Secondary | ICD-10-CM

## 2020-12-13 LAB — BASIC METABOLIC PANEL
Anion gap: 10 (ref 5–15)
BUN: <5 mg/dL — ABNORMAL LOW (ref 6–20)
CO2: 26 mmol/L (ref 22–32)
Calcium: 8.5 mg/dL — ABNORMAL LOW (ref 8.9–10.3)
Chloride: 104 mmol/L (ref 98–111)
Creatinine, Ser: 0.66 mg/dL (ref 0.44–1.00)
GFR, Estimated: >60 mL/min (ref >60)
Glucose, Bld: 107 mg/dL — ABNORMAL HIGH (ref 70–99)
Potassium: 3.5 mmol/L (ref 3.5–5.1)
Sodium: 140 mmol/L (ref 135–145)

## 2020-12-13 LAB — CBC
HCT: 24.8 % — ABNORMAL LOW (ref 36.0–46.0)
Hemoglobin: 8.5 g/dL — ABNORMAL LOW (ref 12.0–15.0)
MCH: 31.3 pg (ref 26.0–34.0)
MCHC: 34.3 g/dL (ref 30.0–36.0)
MCV: 91.2 fL (ref 80.0–100.0)
Platelets: 185 10*3/uL (ref 150–400)
RBC: 2.72 MIL/uL — ABNORMAL LOW (ref 3.87–5.11)
RDW: 13.5 % (ref 11.5–15.5)
WBC: 6.1 10*3/uL (ref 4.0–10.5)
nRBC: 0 % (ref 0.0–0.2)

## 2020-12-13 LAB — AEROBIC CULTURE W GRAM STAIN (SUPERFICIAL SPECIMEN)

## 2020-12-13 LAB — MAGNESIUM: Magnesium: 1.8 mg/dL (ref 1.7–2.4)

## 2020-12-13 MED ORDER — ACETAMINOPHEN 500 MG PO TABS
1000.0000 mg | ORAL_TABLET | Freq: Three times a day (TID) | ORAL | Status: AC
  Administered 2020-12-13 (×2): 1000 mg via ORAL
  Filled 2020-12-13 (×2): qty 2

## 2020-12-13 MED ORDER — ENOXAPARIN SODIUM 40 MG/0.4ML ~~LOC~~ SOLN
40.0000 mg | SUBCUTANEOUS | Status: DC
  Administered 2020-12-13 – 2020-12-15 (×3): 40 mg via SUBCUTANEOUS
  Filled 2020-12-13 (×3): qty 0.4

## 2020-12-13 MED ORDER — GABAPENTIN 400 MG PO CAPS
800.0000 mg | ORAL_CAPSULE | Freq: Three times a day (TID) | ORAL | Status: DC
  Administered 2020-12-13 – 2020-12-15 (×7): 800 mg via ORAL
  Filled 2020-12-13 (×7): qty 2

## 2020-12-13 NOTE — Progress Notes (Addendum)
[facility] Progress Note  Patient: Wendy Douglas    SNK:[id number]  DOA: 12/10/2020     Date of Service: the patient was seen and examined on 12/13/2020  Brief hospital course: Past medical history of IVDU with heroin cocaine and meth, on methadone chronically.  Presents with complaints of right arm infection. Underwent debridement. Currently plan is IV antibiotics and follow-up on ID and Ortho recommendation.  Assessment and Plan: 1.  Cellulitis and abscess of the right arm IV drug abuse Last injected meth and cocaine last night although did not use right arm. Presents with painful right arm ongoing for last 5 days. X-ray shows gas formation and abscess. Underwent excisional debridement of right upper arm. Twice A wet-to-dry dressing to the open wound. Currently have a Penrose drain which needs to be removed tomorrow. Elevate upper extremity. ID consultation for antibiotic.   Superficial wound culture currently growing gram-positive cocci. Deep culture currently no growth. Currently on IV vancomycin and Unasyn.  2.  Chronic opiate dependence Patient is regular patient of [facility] on [address]. Verified that the patient is on 125 mg of methadone dose. Per patient she has discussed with her director and is currently planning to remain sober. Pain management per Ortho. On chronic gabapentin.  Will increase the dose from 600 to 800 mg 3 times daily. Continue resume.  3.  Anemia of chronic disease Likely in the setting of infection. Expecting postop drop as well. We will monitor.  4.  LFT elevation. Mild.  Likely secondary to severe infection.  Monitor.  5.  Active smoker. Nicotine patch  Diet: Regular diet DVT Prophylaxis:   enoxaparin (LOVENOX) injection 40 mg Start: 12/13/20 1100 SCD's Start: 12/11/20 1228 SCDs Start: 12/11/20 0316   Advance goals of care discussion: Full code  Family Communication: no family was present at bedside, at the  time of interview.   Disposition:  Status is: Inpatient  Remains inpatient appropriate because:IV treatments appropriate due to intensity of illness or inability to take PO   Dispo: The patient is from: Home              Anticipated d/c is to: Home              Anticipated d/c date is: > 3 days              Patient currently is not medically stable to d/c.   Difficult to place patient No  Subjective: No nausea no vomiting.  No fever no chills.  No chest pain.  Swelling in the arm improving significantly.  Physical Exam:  General: Appear in mild distress, no Rash; Oral Mucosa Clear, moist. no Abnormal Neck Mass Or lumps, Conjunctiva normal  Cardiovascular: S1 and S2 Present, no Murmur, Respiratory: good respiratory effort, Bilateral Air entry present and CTA, no Crackles, no wheezes Abdomen: Bowel Sound present, Soft and no tenderness Extremities: no Pedal edema, right upper extremity swelling improving. Neurology: alert and oriented to time, place, and person affect appropriate. no new focal deficit Gait not checked due to patient safety concerns    Vitals:   12/12/20 1928 12/13/20 0337 12/13/20 1013 12/13/20 1504  BP: (!) 152/58 (!) 129/53 (!) 147/74 138/64  Pulse: 68 (!) 57 (!) 59 75  Resp: 17 17 17 18   Temp: 98 F (36.7 C) 98.3 F (36.8 C) 98.3 F (36.8 C) 98.4 F (36.9 C)  TempSrc: Oral  Oral Oral  SpO2: 99% 98% 99% 98%  Weight:  Intake/Output Summary (Last 24 hours) at 12/13/2020 1712 Last data filed at 12/13/2020 1653 Gross per 24 hour  Intake 999.99 ml  Output --  Net 999.99 ml   Filed Weights   12/12/20 1000  Weight: 67.1 kg    Data Reviewed: I have personally reviewed and interpreted daily labs, tele strips, imaging. I reviewed all nursing notes, pharmacy notes, vitals, pertinent old records I have discussed plan of care as described above with RN and patient/family.  CBC: Recent Labs  Lab 12/10/20 1340 12/11/20 0524 12/12/20 0427  12/13/20 0940  WBC 11.3* 7.8 8.9 6.1  NEUTROABS 8.6*  --   --   --   HGB 11.5* 10.3* 9.0* 8.5*  HCT 34.0* 30.8* 28.2* 24.8*  MCV 94.2 93.6 95.3 91.2  PLT 192 192 205 185   Basic Metabolic Panel: Recent Labs  Lab 12/10/20 1340 12/11/20 0524 12/13/20 0940  NA 138 135 140  K 4.6 3.5 3.5  CL 100 100 104  CO2 26 28 26   GLUCOSE 110* 92 107*  BUN 10 7 <5*  CREATININE 0.69 0.60 0.66  CALCIUM 8.9 8.4* 8.5*  MG  --   --  1.8    Studies: No results found.  Scheduled Meds: . acetaminophen  1,000 mg Oral TID  . amitriptyline  25 mg Oral QHS  . ARIPiprazole  12 mg Oral Daily  . atomoxetine  40 mg Oral Daily  . citalopram  10 mg Oral Daily  . enoxaparin (LOVENOX) injection  40 mg Subcutaneous Q24H  . gabapentin  800 mg Oral TID  . hydrOXYzine  50 mg Oral TID  . methadone  125 mg Oral Daily  . methocarbamol  750 mg Oral TID  . mometasone-formoterol  2 puff Inhalation BID  . nicotine  21 mg Transdermal Daily  . sodium chloride flush  10-40 mL Intracatheter Q12H   Continuous Infusions: . ceFEPime (MAXIPIME) IV 2 g (12/13/20 1444)  . vancomycin 1,000 mg (12/13/20 1552)   PRN Meds: acetaminophen, acetaminophen, albuterol, nicotine polacrilex, ondansetron **OR** ondansetron (ZOFRAN) IV, oxyCODONE, oxyCODONE, sodium chloride flush  Time spent: 35 minutes  Author: 02/10/21, MD [facility] 12/13/2020 5:12 PM  To reach On-call, see care teams to locate the attending and reach out via [email or web address]. Between 7PM-7AM, please contact night-coverage If you still have difficulty reaching the attending provider, please page the [facility] (Director on Call) for [facility] on amion for assistance.

## 2020-12-13 NOTE — Plan of Care (Signed)
  Problem: Education: Goal: Knowledge of General Education information will improve Description: Including pain rating scale, medication(s)/side effects and non-pharmacologic comfort measures Outcome: Progressing   Problem: Health Behavior/Discharge Planning: Goal: Ability to manage health-related needs will improve Outcome: Progressing   Problem: Activity: Goal: Risk for activity intolerance will decrease Outcome: Progressing   Problem: Coping: Goal: Level of anxiety will decrease Outcome: Progressing   Problem: Elimination: Goal: Will not experience complications related to bowel motility Outcome: Progressing   Problem: Pain Managment: Goal: General experience of comfort will improve Outcome: Progressing   

## 2020-12-13 NOTE — Progress Notes (Signed)
Subjective: 2 Days Post-Op Procedure(s) (LRB): IRRIGATION AND DEBRIDEMENT EXTREMITY (Right) Patient reports pain as mild.  No fevers or chills  Objective: Vital signs in last 24 hours: Temp:  [97.6 F (36.4 C)-98.3 F (36.8 C)] 98.3 F (36.8 C) (02/10 0337) Pulse Rate:  [57-70] 57 (02/10 0337) Resp:  [17] 17 (02/10 0337) BP: (129-152)/(53-76) 129/53 (02/10 0337) SpO2:  [97 %-99 %] 98 % (02/10 0337) Weight:  [67.1 kg] 67.1 kg (02/09 1000)  Intake/Output from previous day: 02/09 0701 - 02/10 0700 In: 624.9 [IV Piggyback:624.9] Out: -  Intake/Output this shift: No intake/output data recorded.  Recent Labs    12/10/20 1340 12/11/20 0524 12/12/20 0427  HGB 11.5* 10.3* 9.0*   Recent Labs    12/11/20 0524 12/12/20 0427  WBC 7.8 8.9  RBC 3.29* 2.96*  HCT 30.8* 28.2*  PLT 192 205   Recent Labs    12/10/20 1340 12/11/20 0524  NA 138 135  K 4.6 3.5  CL 100 100  CO2 26 28  BUN 10 7  CREATININE 0.69 0.60  GLUCOSE 110* 92  CALCIUM 8.9 8.4*   No results for input(s): LABPT, INR in the last 72 hours.  Neurovascular intact Sensation intact distally Intact pulses distally  RUE- improved swelling and induration to the forearm and posterolateral upper arm.  Still remains swelling/induration/erythema to the medial upper arm.  Penrose drain in place with serosanquinous drainage to bandage.      Assessment/Plan: 2 Days Post-Op Procedure(s) (LRB): IRRIGATION AND DEBRIDEMENT EXTREMITY (Right)  Continue wet-to-dry dressing changes bid Continue IV abx Awaiting intra-op cx Will likely pull penrose drain tomorrow      Wendy Douglas 12/13/2020, 7:52 AM

## 2020-12-13 NOTE — Plan of Care (Signed)

## 2020-12-13 NOTE — Progress Notes (Signed)
[facility]  Date of Admission:  12/10/2020       Abx: 2/09-c cefepime 2/07-c vanc  2/07-09 pip-tazo --> unysin  ASSESSMENT: Abscess/cellulitis right UE s/p I&D Hx ivdu -- denies recent use (last use > 2-3 months prior to admission; no UE)  2/08 bcx ngtd 2/08 swab wound cx gpc/gnr; 2/08 I&D cx ngtd  ------ 2/10 assessment Patient feeling much better with pain/swelling. Exam showed no erythema/induration around the incision today; there is no purulent discharge out of wound today either  PLAN: 1. Continue vanc/cefepime 2. Potential change to oral abx tomorrow -- and regimen to depend on culture result    Principal Problem:   Cellulitis and abscess of upper extremity Active Problems:   Chronic narcotic dependence (HCC)   Polysubstance abuse (HCC)   Chronic hepatitis C without hepatic coma (HCC)   IVDU (intravenous drug user)   Sepsis (HCC)   Scheduled Meds: . acetaminophen  1,000 mg Oral TID  . amitriptyline  25 mg Oral QHS  . ARIPiprazole  12 mg Oral Daily  . atomoxetine  40 mg Oral Daily  . citalopram  10 mg Oral Daily  . gabapentin  600 mg Oral TID  . hydrOXYzine  50 mg Oral TID  . methadone  125 mg Oral Daily  . methocarbamol  750 mg Oral TID  . mometasone-formoterol  2 puff Inhalation BID  . nicotine  21 mg Transdermal Daily  . sodium chloride flush  10-40 mL Intracatheter Q12H   Continuous Infusions: . ceFEPime (MAXIPIME) IV 2 g (12/13/20 0651)  . vancomycin 1,000 mg (12/13/20 0433)   PRN Meds:.acetaminophen, acetaminophen, albuterol, HYDROmorphone (DILAUDID) injection, nicotine polacrilex, ondansetron **OR** ondansetron (ZOFRAN) IV, oxyCODONE, oxyCODONE, sodium chloride flush   SUBJECTIVE: No f/c/n/v/diarrhea Improved pain RLE   Review of Systems: ROS Other ros negative  Allergies  Allergen Reactions  . Bee Venom Shortness Of Breath and Swelling  . Sulfa Antibiotics Rash    OBJECTIVE: Vitals:    12/12/20 1000 12/12/20 1550 12/12/20 1928 12/13/20 0337  BP:  (!) 144/76 (!) 152/58 (!) 129/53  Pulse:  70 68 (!) 57  Resp:  17 17 17   Temp:  98.2 F (36.8 C) 98 F (36.7 C) 98.3 F (36.8 C)  TempSrc:  Oral Oral   SpO2:  99% 99% 98%  Weight: 67.1 kg      Body mass index is 23.89 kg/m.  Physical Exam Well appearing, conversant Heent: normocephalic; conj clear; poor dentition cv rrr no mrg Lungs clear abd s/nt msk right arm incision packign no purulence stain; penrose drain in place; no surroundign erythema/swelling; minimal tenderness Skin no rash  Lab Results Lab Results  Component Value Date   WBC 8.9 12/12/2020   HGB 9.0 (L) 12/12/2020   HCT 28.2 (L) 12/12/2020   MCV 95.3 12/12/2020   PLT 205 12/12/2020    Lab Results  Component Value Date   CREATININE 0.60 12/11/2020   BUN 7 12/11/2020   NA 135 12/11/2020   K 3.5 12/11/2020   CL 100 12/11/2020   CO2 28 12/11/2020    Lab Results  Component Value Date   ALT 24 12/10/2020   AST 50 (H) 12/10/2020   ALKPHOS 102 12/10/2020   BILITOT 0.6 12/10/2020     Microbiology: Recent Results (from the past 240 hour(s))  Aerobic Culture (superficial specimen)     Status: None (Preliminary result)   Collection Time: 12/11/20  3:06 AM  Specimen: Abscess  Result Value Ref Range Status   Specimen Description ABSCESS  Final   Special Requests NONE  Final   Gram Stain   Final    FEW WBC PRESENT, PREDOMINANTLY PMN FEW GRAM POSITIVE COCCI IN CLUSTERS RARE GRAM NEGATIVE RODS    Culture   Final    CULTURE REINCUBATED FOR BETTER GROWTH Performed at [facility], [address], [place], [place] [address]    Report Status PENDING  Incomplete  SARS Coronavirus 2 by RT PCR (hospital order, performed in [facility] hospital lab) Nasopharyngeal Nasopharyngeal Swab     Status: None   Collection Time: 12/11/20  4:31 AM   Specimen: Nasopharyngeal Swab  Result Value Ref Range Status   SARS Coronavirus 2 NEGATIVE NEGATIVE  Final    Comment: (NOTE) SARS-CoV-2 target nucleic acids are NOT DETECTED.  The SARS-CoV-2 RNA is generally detectable in upper and lower respiratory specimens during the acute phase of infection. The lowest concentration of SARS-CoV-2 viral copies this assay can detect is 250 copies / mL. A negative result does not preclude SARS-CoV-2 infection and should not be used as the sole basis for treatment or other patient management decisions.  A negative result may occur with improper specimen collection / handling, submission of specimen other than nasopharyngeal swab, presence of viral mutation(s) within the areas targeted by this assay, and inadequate number of viral copies (<250 copies / mL). A negative result must be combined with clinical observations, patient history, and epidemiological information.  Fact Sheet for Patients:   BoilerBrush.com.cy  Fact Sheet for Healthcare Providers: https://pope.com/  This test is not yet approved or  cleared by the [place] FDA and has been authorized for detection and/or diagnosis of SARS-CoV-2 by FDA under an Emergency Use Authorization (EUA).  This EUA will remain in effect (meaning this test can be used) for the duration of the COVID-19 declaration under Section 564(b)(1) of the Act, 21 U.S.C. section 360bbb-3(b)(1), unless the authorization is terminated or revoked sooner.  Performed at [facility], [address], [place], [place] [address]   Blood culture (routine x 2)     Status: None (Preliminary result)   Collection Time: 12/11/20  4:50 AM   Specimen: BLOOD  Result Value Ref Range Status   Specimen Description BLOOD LEFT UPPER ARM  Final   Special Requests   Final    BOTTLES DRAWN AEROBIC AND ANAEROBIC Blood Culture adequate volume   Culture   Final    NO GROWTH 2 DAYS Performed at [facility], [address], [place], [place] [address]    Report Status PENDING   Incomplete  Surgical pcr screen     Status: None   Collection Time: 12/11/20 11:09 AM   Specimen: Nasal Mucosa; Nasal Swab  Result Value Ref Range Status   MRSA, PCR NEGATIVE NEGATIVE Final   Staphylococcus aureus NEGATIVE NEGATIVE Final    Comment: (NOTE) The Xpert SA Assay (FDA approved for NASAL specimens in patients 39 years of age and older), is one component of a comprehensive surveillance program. It is not intended to diagnose infection nor to guide or monitor treatment. Performed at [facility] Lab, [address] [place], [place] [address]   Aerobic/Anaerobic Culture (surgical/deep wound)     Status: None (Preliminary result)   Collection Time: 12/11/20 11:51 AM   Specimen: PATH Other; Body Fluid  Result Value Ref Range Status   Specimen Description ABSCESS  Final  Special Requests RIGHT UPPER ARM SPEC A  Final   Gram Stain   Final    MODERATE WBC PRESENT,BOTH PMN AND MONONUCLEAR NO ORGANISMS SEEN    Culture   Final    NO GROWTH < 24 HOURS Performed at [facility] Lab, [address], [place], [place] [address]    Report Status PENDING  Incomplete  Culture, blood (Routine X 2) w Reflex to ID Panel     Status: None (Preliminary result)   Collection Time: 12/11/20 11:07 PM   Specimen: BLOOD  Result Value Ref Range Status   Specimen Description BLOOD RIGHT FOOT  Final   Special Requests   Final    AEROBIC BOTTLE ONLY Blood Culture results may not be optimal due to an inadequate volume of blood received in culture bottles   Culture   Final    NO GROWTH 1 DAY Performed at [facility] Lab, [address] [place], [place] [address]    Report Status PENDING  Incomplete      Raymondo Band, MD [facility] for Infectious Disease [facility] [phone] pager    12/13/2020, 9:01 AM

## 2020-12-13 NOTE — Progress Notes (Signed)
Wound dressing was changed with wet to dry dressing and wrapped with ace wrap.

## 2020-12-13 NOTE — Plan of Care (Signed)
  Problem: Education: Goal: Knowledge of General Education information will improve Description: Including pain rating scale, medication(s)/side effects and non-pharmacologic comfort measures Outcome: Progressing   Problem: Health Behavior/Discharge Planning: Goal: Ability to manage health-related needs will improve Outcome: Progressing   Problem: Activity: Goal: Risk for activity intolerance will decrease Outcome: Progressing   Problem: Nutrition: Goal: Adequate nutrition will be maintained Outcome: Progressing   Problem: Elimination: Goal: Will not experience complications related to bowel motility Outcome: Progressing   Problem: Pain Managment: Goal: General experience of comfort will improve Outcome: Progressing   Problem: Safety: Goal: Ability to remain free from injury will improve Outcome: Progressing   

## 2020-12-14 ENCOUNTER — Other Ambulatory Visit: Payer: Self-pay | Admitting: Internal Medicine

## 2020-12-14 LAB — BASIC METABOLIC PANEL
Anion gap: 9 (ref 5–15)
BUN: 5 mg/dL — ABNORMAL LOW (ref 6–20)
CO2: 26 mmol/L (ref 22–32)
Calcium: 8.5 mg/dL — ABNORMAL LOW (ref 8.9–10.3)
Chloride: 104 mmol/L (ref 98–111)
Creatinine, Ser: 0.69 mg/dL (ref 0.44–1.00)
GFR, Estimated: >60 mL/min (ref >60)
Glucose, Bld: 92 mg/dL (ref 70–99)
Potassium: 4.3 mmol/L (ref 3.5–5.1)
Sodium: 139 mmol/L (ref 135–145)

## 2020-12-14 LAB — CBC
HCT: 25.5 % — ABNORMAL LOW (ref 36.0–46.0)
Hemoglobin: 8.6 g/dL — ABNORMAL LOW (ref 12.0–15.0)
MCH: 30.9 pg (ref 26.0–34.0)
MCHC: 33.7 g/dL (ref 30.0–36.0)
MCV: 91.7 fL (ref 80.0–100.0)
Platelets: 171 10*3/uL (ref 150–400)
RBC: 2.78 MIL/uL — ABNORMAL LOW (ref 3.87–5.11)
RDW: 13.7 % (ref 11.5–15.5)
WBC: 5.8 10*3/uL (ref 4.0–10.5)
nRBC: 0 % (ref 0.0–0.2)

## 2020-12-14 LAB — MAGNESIUM: Magnesium: 1.8 mg/dL (ref 1.7–2.4)

## 2020-12-14 MED ORDER — AMOXICILLIN-POT CLAVULANATE 875-125 MG PO TABS
1.0000 | ORAL_TABLET | Freq: Two times a day (BID) | ORAL | Status: DC
  Administered 2020-12-14 – 2020-12-15 (×3): 1 via ORAL
  Filled 2020-12-14 (×3): qty 1

## 2020-12-14 MED ORDER — GABAPENTIN 800 MG PO TABS: 800.0000 mg | ORAL_TABLET | Freq: Three times a day (TID) | ORAL | 0 refills | Status: AC

## 2020-12-14 MED ORDER — AMOXICILLIN-POT CLAVULANATE 875-125 MG PO TABS: 1.0000 | ORAL_TABLET | Freq: Two times a day (BID) | ORAL | 0 refills | Status: AC

## 2020-12-14 MED ORDER — NICOTINE 21 MG/24HR TD PT24: 21.0000 mg | MEDICATED_PATCH | Freq: Every day | TRANSDERMAL | 0 refills | Status: AC

## 2020-12-14 MED ORDER — OXYCODONE-ACETAMINOPHEN 10-325 MG PO TABS: 1.0000 | ORAL_TABLET | Freq: Four times a day (QID) | ORAL | 0 refills | Status: AC | PRN

## 2020-12-14 MED FILL — GABAPENTIN 800 MG TABLET: 800 | 20 days supply | Qty: 60 | Fill #0

## 2020-12-14 MED FILL — AMOX-CLAV 875-125 MG TABLET: 875-125 | 7 days supply | Qty: 14 | Fill #0

## 2020-12-14 MED FILL — OXYCODONE-APAP 10-325: 10-325 | 5 days supply | Qty: 20 | Fill #0

## 2020-12-14 NOTE — Plan of Care (Signed)
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
## 2020-12-14 NOTE — TOC CAGE-AID Note (Signed)
Transition of Care [facility]) - CAGE-AID Screening   Patient Details  Name: Wendy Douglas MRN: [id number] Date of Birth: 1976/04/09  [facility]) CM/SW Contact:    Epifanio Lesches, RN Phone Number: 12/14/2020, 2:54 PM   Clinical Narrative: Pt states she does have a drug problem and is active with [facility]. Pt states plans to resume daily visits with [facility] once d/c.   CAGE-AID Screening:    Have You Ever Felt You Ought to Cut Down on Your Drinking or Drug Use?: Yes Have People Annoyed You By Critizing Your Drinking Or Drug Use?: Yes Have You Felt Bad Or Guilty About Your Drinking Or Drug Use?: Yes Have You Ever Had a Drink or Used Drugs First Thing In The Morning to Steady Your Nerves or to Get Rid of a Hangover?: Yes CAGE-AID Score: 4  Substance Abuse Education Offered: Yes (Attends [facility])  Substance abuse interventions: Other (must comment) (pt states once d/c will go to [facility] for assistance)

## 2020-12-14 NOTE — Progress Notes (Addendum)
[facility]  Date of Admission:  12/10/2020       Abx: 2/09-c cefepime 2/07-c vanc  2/07-09 pip-tazo --> unysin  ASSESSMENT: Abscess/cellulitis right UE s/p I&D Hx ivdu -- denies recent use (last use > 2-3 months prior to admission; no UE)  2/08 bcx ngtd 2/08 swab wound cx gpc/gnr; 2/08 I&D cx strep intermedius  ------ 2/11 assessment Patient feeling much better with pain/swelling. Strep intermedius (a strep milleri group) is growing from wound cx. This explains the fact there was no staph aureus found on wound. It is very pyogenic and like to form abscess. But by itself, shouldn't be the sole driver of her arm abscess. We'll need to get an echo to r/o septic emboli  PLAN: 1. Switch abx to augmentin 2. Will get u/s duplex upper ext r/o dvt 3. Will also order tte 4. If tte is negative, would treat 2 weeks of augmentin given this is strep intermedius to finish until 12/25/20   Principal Problem:   Cellulitis and abscess of upper extremity Active Problems:   Chronic narcotic dependence (HCC)   Polysubstance abuse (HCC)   Chronic hepatitis C without hepatic coma (HCC)   IVDU (intravenous drug user)   Sepsis (HCC)   Scheduled Meds: . amitriptyline  25 mg Oral QHS  . amoxicillin-clavulanate  1 tablet Oral Q12H  . ARIPiprazole  12 mg Oral Daily  . atomoxetine  40 mg Oral Daily  . citalopram  10 mg Oral Daily  . enoxaparin (LOVENOX) injection  40 mg Subcutaneous Q24H  . gabapentin  800 mg Oral TID  . hydrOXYzine  50 mg Oral TID  . methadone  125 mg Oral Daily  . methocarbamol  750 mg Oral TID  . mometasone-formoterol  2 puff Inhalation BID  . nicotine  21 mg Transdermal Daily  . sodium chloride flush  10-40 mL Intracatheter Q12H   Continuous Infusions:  PRN Meds:.acetaminophen, albuterol, nicotine polacrilex, ondansetron **OR** ondansetron (ZOFRAN) IV, oxyCODONE, oxyCODONE, sodium chloride flush   SUBJECTIVE: No  f/c/n/v/diarrhea Improved pain RLE Penrose drain out She is happy with where things are   Review of Systems: ROS Other ros negative  Allergies  Allergen Reactions  . Bee Venom Shortness Of Breath and Swelling  . Sulfa Antibiotics Rash    OBJECTIVE: Vitals:   12/13/20 1958 12/13/20 2020 12/14/20 0513 12/14/20 1230  BP: (!) 119/56  118/63 (!) 115/98  Pulse: 63  62 70  Resp: 18  14 16   Temp: 98.1 F (36.7 C)  97.8 F (36.6 C) 97.6 F (36.4 C)  TempSrc: Oral  Oral Oral  SpO2: 99% 98% 98% 97%  Weight:      Height:       Body mass index is 23.4 kg/m.  Physical Exam Well appearing, conversant Heent: normocephalic; conj clear; poor dentition cv rrr no mrg Lungs clear abd s/nt msk right arm incision packign no purulence stain; penrose drain in place; no surroundign erythema/swelling; minimal tenderness; there is distal swelling of the forearm though that hasn't gotten better Skin no rash  Lab Results Lab Results  Component Value Date   WBC 5.8 12/14/2020   HGB 8.6 (L) 12/14/2020   HCT 25.5 (L) 12/14/2020   MCV 91.7 12/14/2020   PLT 171 12/14/2020    Lab Results  Component Value Date   CREATININE 0.69 12/14/2020   BUN 5 (L) 12/14/2020   NA 139 12/14/2020   K 4.3 12/14/2020  CL 104 12/14/2020   CO2 26 12/14/2020    Lab Results  Component Value Date   ALT 24 12/10/2020   AST 50 (H) 12/10/2020   ALKPHOS 102 12/10/2020   BILITOT 0.6 12/10/2020     Microbiology: Recent Results (from the past 240 hour(s))  Aerobic Culture (superficial specimen)     Status: None   Collection Time: 12/11/20  3:06 AM   Specimen: Abscess  Result Value Ref Range Status   Specimen Description ABSCESS  Final   Special Requests NONE  Final   Gram Stain   Final    FEW WBC PRESENT, PREDOMINANTLY PMN FEW GRAM POSITIVE COCCI IN CLUSTERS RARE GRAM NEGATIVE RODS    Culture   Final    FEW MULTIPLE ORGANISMS PRESENT, NONE PREDOMINANT NO GROUP A STREP (S.PYOGENES) ISOLATED NO  STAPHYLOCOCCUS AUREUS ISOLATED Performed at [facility], [address], [place], [place] [address]    Report Status 12/13/2020 FINAL  Final  SARS Coronavirus 2 by RT PCR (hospital order, performed in [facility] hospital lab) Nasopharyngeal Nasopharyngeal Swab     Status: None   Collection Time: 12/11/20  4:31 AM   Specimen: Nasopharyngeal Swab  Result Value Ref Range Status   SARS Coronavirus 2 NEGATIVE NEGATIVE Final    Comment: (NOTE) SARS-CoV-2 target nucleic acids are NOT DETECTED.  The SARS-CoV-2 RNA is generally detectable in upper and lower respiratory specimens during the acute phase of infection. The lowest concentration of SARS-CoV-2 viral copies this assay can detect is 250 copies / mL. A negative result does not preclude SARS-CoV-2 infection and should not be used as the sole basis for treatment or other patient management decisions.  A negative result may occur with improper specimen collection / handling, submission of specimen other than nasopharyngeal swab, presence of viral mutation(s) within the areas targeted by this assay, and inadequate number of viral copies (<250 copies / mL). A negative result must be combined with clinical observations, patient history, and epidemiological information.  Fact Sheet for Patients:   BoilerBrush.com.cy  Fact Sheet for Healthcare Providers: https://pope.com/  This test is not yet approved or  cleared by the [place] FDA and has been authorized for detection and/or diagnosis of SARS-CoV-2 by FDA under an Emergency Use Authorization (EUA).  This EUA will remain in effect (meaning this test can be used) for the duration of the COVID-19 declaration under Section 564(b)(1) of the Act, 21 U.S.C. section 360bbb-3(b)(1), unless the authorization is terminated or revoked sooner.  Performed at [facility] Lab, [address] [place], [place] [address]   Blood culture  (routine x 2)     Status: None (Preliminary result)   Collection Time: 12/11/20  4:50 AM   Specimen: BLOOD  Result Value Ref Range Status   Specimen Description BLOOD LEFT UPPER ARM  Final   Special Requests   Final    BOTTLES DRAWN AEROBIC AND ANAEROBIC Blood Culture adequate volume   Culture   Final    NO GROWTH 3 DAYS Performed at [facility] Lab, [address] [place], [place] [address]    Report Status PENDING  Incomplete  Surgical pcr screen     Status: None   Collection Time: 12/11/20 11:09 AM   Specimen: Nasal Mucosa; Nasal Swab  Result Value Ref Range Status   MRSA, PCR NEGATIVE NEGATIVE Final   Staphylococcus aureus NEGATIVE NEGATIVE Final    Comment: (NOTE) The Xpert SA Assay (FDA approved for NASAL specimens in patients  57 years of age and older), is one component of a comprehensive surveillance program. It is not intended to diagnose infection nor to guide or monitor treatment. Performed at [facility] Lab, [address], [place], [place] [address]   Aerobic/Anaerobic Culture (surgical/deep wound)     Status: None (Preliminary result)   Collection Time: 12/11/20 11:51 AM   Specimen: PATH Other; Body Fluid  Result Value Ref Range Status   Specimen Description ABSCESS  Final   Special Requests RIGHT UPPER ARM SPEC A  Final   Gram Stain   Final    MODERATE WBC PRESENT,BOTH PMN AND MONONUCLEAR NO ORGANISMS SEEN Performed at [facility], [address], [place], [place] [address]    Culture   Final    RARE STREPTOCOCCUS INTERMEDIUS NO ANAEROBES ISOLATED; CULTURE IN PROGRESS FOR 5 DAYS    Report Status PENDING  Incomplete  Culture, blood (Routine X 2) w Reflex to ID Panel     Status: None (Preliminary result)   Collection Time: 12/11/20 11:07 PM   Specimen: BLOOD  Result Value Ref Range Status   Specimen Description BLOOD RIGHT FOOT  Final   Special Requests   Final    AEROBIC BOTTLE ONLY Blood Culture results may not be optimal due to an  inadequate volume of blood received in culture bottles   Culture   Final    NO GROWTH 2 DAYS Performed at [facility], [address] [place], [place] [address]    Report Status PENDING  Incomplete      Raymondo Band, MD [facility] for Infectious Disease [facility] [phone] pager    12/14/2020, 4:42 PM

## 2020-12-14 NOTE — Progress Notes (Signed)
Subjective: 3 Days Post-Op Procedure(s) (LRB): IRRIGATION AND DEBRIDEMENT EXTREMITY (Right) Patient reports pain as mild. Feeling well.  No fevers/chills   Objective: Vital signs in last 24 hours: Temp:  [97.8 F (36.6 C)-98.4 F (36.9 C)] 97.8 F (36.6 C) (02/11 0513) Pulse Rate:  [59-75] 62 (02/11 0513) Resp:  [14-18] 14 (02/11 0513) BP: (118-147)/(56-74) 118/63 (02/11 0513) SpO2:  [98 %-99 %] 98 % (02/11 0513) Weight:  [65.8 kg] 65.8 kg (02/10 1820)  Intake/Output from previous day: 02/10 0701 - 02/11 0700 In: 1195.1 [P.O.:720; IV Piggyback:475.1] Out: -  Intake/Output this shift: No intake/output data recorded.  Recent Labs    12/12/20 0427 12/13/20 0940 12/14/20 0723  HGB 9.0* 8.5* 8.6*   Recent Labs    12/13/20 0940 12/14/20 0723  WBC 6.1 5.8  RBC 2.72* 2.78*  HCT 24.8* 25.5*  PLT 185 171   Recent Labs    12/13/20 0940  NA 140  K 3.5  CL 104  CO2 26  BUN <5*  CREATININE 0.66  GLUCOSE 107*  CALCIUM 8.5*   No results for input(s): LABPT, INR in the last 72 hours.  Neurologically intact Neurovascular intact Sensation intact distally Intact pulses distally Dorsiflexion/Plantar flexion intact Compartment soft  RUE- penrose drains in place.  Swelling/erythema have significantly improved.   Assessment/Plan: 3 Days Post-Op Procedure(s) (LRB): IRRIGATION AND DEBRIDEMENT EXTREMITY (Right)    Plan Continue wet-to-dry dressing changes bid Continue IV abx Intra-op cx ngtd Penrose drains pulled by me today    Cristie Hem 12/14/2020, 8:19 AM

## 2020-12-14 NOTE — Progress Notes (Signed)
[facility] Progress Note  Patient: Wendy Douglas    WUJ:[id number]  DOA: 12/10/2020     Date of Service: the patient was seen and examined on 12/14/2020  Brief hospital course: Past medical history of IVDU with heroin cocaine and meth, on methadone chronically.  Presents with complaints of right arm infection. Underwent debridement. Currently plan is IV antibiotics and follow-up on ID and Ortho recommendation.  Assessment and Plan: 1.  Cellulitis and abscess of the right arm IV drug abuse S/P excisional debridement of right upper arm abscess Last injected meth and cocaine last night although did not use right arm. Presents with painful right arm ongoing for last 5 days. X-ray shows gas formation and abscess. Underwent excisional debridement of right upper arm. Twice A wet-to-dry dressing to the open wound. Penrose drains removed. Swelling reoccurred on 2/11.  Elevate upper extremity. ID consultation for antibiotic.   Superficial wound culture currently growing gram-positive cocci. Deep culture currently no growth. Currently on IV vancomycin and Unasyn.  Change to oral Augmentin. Remove PICC line. Oral antibiotics for 7 more days starting 2/11. Per Ortho patient will require twice a day dressing changes and home health.  Currently unable to arrange due to patient's no insurance.  Will monitor for Ortho clearance.  2.  Chronic opiate dependence Patient is regular patient of [facility] on [address]. Verified that the patient is on 125 mg of methadone dose. Per patient she has discussed with her director and is currently planning to remain sober. Pain management per Ortho. On chronic gabapentin.  Will increase the dose from 600 to 800 mg 3 times daily. Continue resume.  3.  Anemia of chronic disease Likely in the setting of infection. Expecting postop drop as well. We will monitor.  4.  LFT elevation. Mild.  Likely secondary to severe infection.   Monitor.  5.  Active smoker. Nicotine patch  Diet: Regular diet DVT Prophylaxis:   enoxaparin (LOVENOX) injection 40 mg Start: 12/13/20 1100 SCD's Start: 12/11/20 1228 SCDs Start: 12/11/20 0316   Advance goals of care discussion: Full code  Family Communication: no family was present at bedside, at the time of interview.   Disposition:  Status is: Inpatient  Remains inpatient appropriate because: Requires complex dressing changes for now per orthopedics.   Dispo: The patient is from: Home              Anticipated d/c is to: Home              Anticipated d/c date is: 1 to 2 days              Patient currently is medically stable to d/c.   Difficult to place patient No  Subjective: No nausea no vomiting but no fever no chills.  Pain well controlled.  Physical Exam:  General: Appear in mild distress, no Rash; Oral Mucosa Clear, moist. no Abnormal Neck Mass Or lumps, Conjunctiva normal  Cardiovascular: S1 and S2 Present, no Murmur, Respiratory: good respiratory effort, Bilateral Air entry present and CTA, no Crackles, no wheezes Abdomen: Bowel Sound present, Soft and no tenderness Extremities: no Pedal edema, left lower extremity edema. Neurology: alert and oriented to time, place, and person affect appropriate. no new focal deficit Gait not checked due to patient safety concerns    Vitals:   12/13/20 1958 12/13/20 2020 12/14/20 0513 12/14/20 1230  BP: (!) 119/56  118/63 (!) 115/98  Pulse: 63  62 70  Resp: 18  14 16  Temp: 98.1 F (36.7 C)  97.8 F (36.6 C) 97.6 F (36.4 C)  TempSrc: Oral  Oral Oral  SpO2: 99% 98% 98% 97%  Weight:      Height:        Intake/Output Summary (Last 24 hours) at 12/14/2020 1606 Last data filed at 12/14/2020 0900 Gross per 24 hour  Intake 1020 ml  Output --  Net 1020 ml   Filed Weights   12/12/20 1000 12/13/20 1820  Weight: 67.1 kg 65.8 kg    Data Reviewed: I have personally reviewed and interpreted daily labs, tele strips,  imaging. I reviewed all nursing notes, pharmacy notes, vitals, pertinent old records I have discussed plan of care as described above with RN and patient/family.  CBC: Recent Labs  Lab 12/10/20 1340 12/11/20 0524 12/12/20 0427 12/13/20 0940 12/14/20 0723  WBC 11.3* 7.8 8.9 6.1 5.8  NEUTROABS 8.6*  --   --   --   --   HGB 11.5* 10.3* 9.0* 8.5* 8.6*  HCT 34.0* 30.8* 28.2* 24.8* 25.5*  MCV 94.2 93.6 95.3 91.2 91.7  PLT 192 192 205 185 171   Basic Metabolic Panel: Recent Labs  Lab 12/10/20 1340 12/11/20 0524 12/13/20 0940 12/14/20 0723  NA 138 135 140 139  K 4.6 3.5 3.5 4.3  CL 100 100 104 104  CO2 26 28 26 26   GLUCOSE 110* 92 107* 92  BUN 10 7 <5* 5*  CREATININE 0.69 0.60 0.66 0.69  CALCIUM 8.9 8.4* 8.5* 8.5*  MG  --   --  1.8 1.8    Studies: No results found.  Scheduled Meds: . amitriptyline  25 mg Oral QHS  . amoxicillin-clavulanate  1 tablet Oral Q12H  . ARIPiprazole  12 mg Oral Daily  . atomoxetine  40 mg Oral Daily  . citalopram  10 mg Oral Daily  . enoxaparin (LOVENOX) injection  40 mg Subcutaneous Q24H  . gabapentin  800 mg Oral TID  . hydrOXYzine  50 mg Oral TID  . methadone  125 mg Oral Daily  . methocarbamol  750 mg Oral TID  . mometasone-formoterol  2 puff Inhalation BID  . nicotine  21 mg Transdermal Daily  . sodium chloride flush  10-40 mL Intracatheter Q12H   Continuous Infusions:  PRN Meds: acetaminophen, albuterol, nicotine polacrilex, ondansetron **OR** ondansetron (ZOFRAN) IV, oxyCODONE, oxyCODONE, sodium chloride flush  Time spent: 35 minutes  Author: , MD [facility] 12/14/2020 4:06 PM  To reach On-call, see care teams to locate the attending and reach out via [email or web address]. Between 7PM-7AM, please contact night-coverage If you still have difficulty reaching the attending provider, please page the [facility] (Director on Call) for [facility] on amion for assistance.

## 2020-12-15 ENCOUNTER — Inpatient Hospital Stay (HOSPITAL_COMMUNITY): Payer: Self-pay

## 2020-12-15 DIAGNOSIS — M79601 Pain in right arm: Secondary | ICD-10-CM

## 2020-12-15 DIAGNOSIS — R2231 Localized swelling, mass and lump, right upper limb: Secondary | ICD-10-CM

## 2020-12-15 DIAGNOSIS — R7881 Bacteremia: Secondary | ICD-10-CM

## 2020-12-15 DIAGNOSIS — L538 Other specified erythematous conditions: Secondary | ICD-10-CM

## 2020-12-15 LAB — ECHOCARDIOGRAM COMPLETE
Area-P 1/2: 2.2 cm2
Height: 66 in
S' Lateral: 3.3 cm
Weight: 2320 oz

## 2020-12-15 MED ORDER — AMOXICILLIN-POT CLAVULANATE 875-125 MG PO TABS: 1.0000 | ORAL_TABLET | Freq: Two times a day (BID) | ORAL | 0 refills | Status: AC

## 2020-12-15 NOTE — Progress Notes (Signed)
Pt is ready to be D/C. Pt is requesting supplies for dressing change. She is not able to afford the supplies. Per previous CM note, referral made to [facility] for [facility] RN (charity referral) and acceptance pending. Contacted [place] with [facility] and left a VM regarding referral. She did not return my call. Contacted [facility] and spoke to [place]. They declined the referral due to staffing.   Contacted Dagoberto Reef, CM, supervisor, and discussed supply issue. She stated that we can give her enough supply that would last until Tuesday and someone will f/u with the pt to assist her with more supply. Discussed this with pt and RN.   Verified pt's phone # [phone].  Provided pt with a MATCH letter.

## 2020-12-15 NOTE — Progress Notes (Signed)
Pt was given her AVS discharge summary and went over with her. Supplies for dressing change were given to pt also. Pt had no further questions.

## 2020-12-15 NOTE — Plan of Care (Signed)

## 2020-12-15 NOTE — Discharge Summary (Signed)
[facility] Discharge Summary   Patient: Clementina Mareno YDX:[id number]  PCP: Patient, No Pcp Per  Date of admission: 12/10/2020   Date of discharge:  12/15/2020     Discharge Diagnoses:  Principal Problem:   Cellulitis and abscess of upper extremity Active Problems:   Chronic narcotic dependence (HCC)   Polysubstance abuse (HCC)   Chronic hepatitis C without hepatic coma (HCC)   IVDU (intravenous drug user)   Sepsis (HCC)   Admitted From: home Disposition:  Home   Recommendations for Outpatient Follow-up:  1. PCP: Establish care with PCP and follow-up with orthopedic as recommended. 2. Follow up LABS/TEST: None   Follow-up Information    Tarry Kos, MD. Schedule an appointment as soon as possible for a visit in 2 week(s).   Specialty: Orthopedic Surgery Contact information: [address] [place] [address] [phone]        [facility] Follow up on 01/10/2021.   Why: 2:30 pm with Dr. Jonah Blue. Please screen pt for orange card  Contact information: [address] [place] [address] [phone]       [facility] Go to.   Why: Follow up with [facility] Contact information:  [address] [place], [place] [address]  Opens 6:30AM Sat Phone: [phone]             Discharge Instructions    Diet general   Complete by: As directed    Discharge wound care:   Complete by: As directed    Right upper extremity wet-to-dry dressing changes twice a day   Dressing Supplies to Room   Complete by: As directed    Increase activity slowly   Complete by: As directed       Diet recommendation: Regular diet  Activity: The patient is advised to gradually reintroduce usual activities, as tolerated  Discharge Condition: stable  Code Status: Full code   History of present illness: As per the H and P dictated on admission, " Almena Hokenson is a 45 y.o. female with medical history  significant of IVDU including heroin, cocaine, and meth.  Chronic opiate dependence.  Multiple previous abscesses including MRSA.  Pt presents to ED with c/o abscess in R arm.  Last injected meth and cocaine last night though this wasn't in to R arm.  Abscess in R bicep which is painful, swollen, red.  Symptoms onset 5 days ago, progressively worsening, now severe.  Has had fevers and chills, some nausea.  No vomiting."  Hospital Course:  Summary of her active problems in the hospital is as following. 1.  Cellulitis and abscess of the right arm IV drug abuse S/P excisional debridement of right upper arm abscess Last injected meth and cocaine last night although did not use right arm. Presents with painful right arm ongoing for last 5 days. X-ray shows gas formation and abscess. Underwent excisional debridement of right upper arm. Twice A wet-to-dry dressing to the open wound. Penrose drains removed. Swelling reoccurred on 2/11.  Elevate upper extremity. ID consultation for antibiotic.   Superficial wound culture currently growing gram-positive cocci. Deep culture currently no growth. Currently on IV vancomycin and Unasyn.  Change to oral Augmentin. Remove PICC line. Echocardiogram shows no infection. Upper extremity Doppler negative for DVT. ID initially recommended oral antibiotics for 7 more days starting 2/11. But after looking at the cultures recommended to extend the antibiotic duration for a total of 2 weeks acute 2/12.  [facility].  New prescription was provided for rest of the days. Per Ortho patient will require twice a day dressing changes and home health.  Currently unable to arrange due to patient's no insurance.  TOC team will assist the patient with supplies.  We will call the patient on Tuesday.  Will provide supplies that will at least last Tuesday for dressing changes.  2.  Chronic opiate dependence Patient  is regular patient of [facility] on [address] Verified that the patient is on 125 mg of methadone dose. Per patient she has discussed with her director and is currently planning to remain sober. Pain management per Ortho. On chronic gabapentin.  Will increase the dose from 600 to 800 mg 3 times daily. Continue resume.  3.  Anemia of chronic disease Likely in the setting of infection. Expecting postop drop as well. We will monitor.  4.  LFT elevation. Mild.  Likely secondary to severe infection.  Monitor.  5.  Active smoker. Nicotine patch  Pain control  - [place] Controlled Substance Reporting System database was reviewed. - 5 day supply was provided. - Patient was instructed, not to drive, operate heavy machinery, perform activities at heights, swimming or participation in water activities or provide baby sitting services while on Pain, Sleep and Anxiety Medications; until her outpatient Physician has advised to do so again.  - Also recommended to not to take more than prescribed Pain, Sleep and Anxiety Medications.  Patient was ambulatory without any assistance. On the day of the discharge the patient's vitals were stable, and no other acute medical condition were reported by patient. The patient was felt safe to be discharge at Home with no therapy needed on discharge.  Consultants: Orthopedics ID Procedures:  Excisional debridement of right upper arm complicated abscess 95 cm  DISCHARGE MEDICATION: Allergies as of 12/15/2020      Reactions   Bee Venom Shortness Of Breath, Swelling   Sulfa Antibiotics Rash      Medication List    TAKE these medications   Advair Diskus 250-50 MCG/DOSE Aepb Generic drug: Fluticasone-Salmeterol Inhale 1 puff into the lungs in the morning and at bedtime.   albuterol 108 (90 Base) MCG/ACT inhaler Commonly known as: VENTOLIN HFA Inhale 1-2 puffs into the lungs every 6 (six) hours as needed for wheezing or  shortness of breath.   amitriptyline 25 MG tablet Commonly known as: ELAVIL Take 25 mg by mouth at bedtime.   amoxicillin-clavulanate 875-125 MG tablet Commonly known as: AUGMENTIN Take 1 tablet by mouth every 12 (twelve) hours for 7 days.   amoxicillin-clavulanate 875-125 MG tablet Commonly known as: Augmentin Take 1 tablet by mouth 2 (two) times daily for 4 days. Start taking on: December 21, 2020   ARIPiprazole 10 MG tablet Commonly known as: ABILIFY Take 10 mg by mouth daily.   Abilify 2 MG tablet Generic drug: ARIPiprazole Take 2 mg by mouth daily.   CeleXA 10 MG tablet Generic drug: citalopram Take 10 mg by mouth daily.   gabapentin 800 MG tablet Commonly known as: NEURONTIN Take 1 tablet (800 mg total) by mouth 3 (three) times daily. What changed:   medication strength  how much to take   hydrOXYzine 50 MG tablet Commonly known as: ATARAX/VISTARIL Take 50 mg by mouth 3 (three) times daily.   methadone 10 MG tablet Commonly known as: DOLOPHINE Take 125 mg by mouth daily.   methocarbamol 750 MG tablet Commonly known as:  ROBAXIN Take 750 mg by mouth 3 (three) times daily.   nicotine 21 mg/24hr patch Commonly known as: NICODERM CQ - dosed in mg/24 hours Place 1 patch (21 mg total) onto the skin daily.   oxyCODONE-acetaminophen 10-325 MG tablet Commonly known as: Percocet Take 1 tablet by mouth every 6 (six) hours as needed for pain.   Strattera 40 MG capsule Generic drug: atomoxetine Take 40 mg by mouth daily.            Discharge Care Instructions  (From admission, onward)         Start     Ordered   12/14/20 0000  Discharge wound care:       Comments: Right upper extremity wet-to-dry dressing changes twice a day   12/14/20 1245         Discharge Exam: Filed Weights   12/12/20 1000 12/13/20 1820  Weight: 67.1 kg 65.8 kg   Vitals:   12/15/20 0439 12/15/20 0748  BP: (!) 147/77   Pulse: 67   Resp: 16   Temp: 97.6 F (36.4 C)    SpO2: 98% 99%   General: Appear in no distress, no Rash; Oral Mucosa Clear, moist. no Abnormal Neck Mass Or lumps, Conjunctiva normal  Cardiovascular: S1 and S2 Present, no Murmur Respiratory: good respiratory effort, Bilateral Air entry present and CTA, no Crackles, no wheezes Abdomen: Bowel Sound present, Soft and no tenderness Extremities: no Pedal edema Neurology: alert and oriented to time, place, and person affect appropriate. no new focal deficit  The results of significant diagnostics from this hospitalization (including imaging, microbiology, ancillary and laboratory) are listed below for reference.    Significant Diagnostic Studies: DG Humerus Right  Result Date: 12/11/2020 CLINICAL DATA:  Arm abscess.  Concern for osteomyelitis EXAM: RIGHT HUMERUS - 2+ VIEW COMPARISON:  None. FINDINGS: Gas within the medial soft tissues of the right upper arm and axilla. No radiopaque foreign bodies. No acute bony abnormality. No bone destruction. IMPRESSION: Gas within the soft tissues in the medial upper arm/axilla. No evidence of osteomyelitis. Electronically Signed   By: Charlett Nose M.D.   On: 12/11/2020 00:37   ECHOCARDIOGRAM COMPLETE  Result Date: 12/15/2020    ECHOCARDIOGRAM REPORT   Patient Name:   MATASHA SMIGELSKI Date of Exam: 12/15/2020 Medical Rec #:  [id number]     Height:       66.0 in Accession #:    [id number]    Weight:       145.0 lb Date of Birth:  12-21-1975    BSA:          1.744 m Patient Age:    44 years      BP:           147/77 mmHg Patient Gender: F             HR:           67 bpm. Exam Location:  Inpatient Procedure: 2D Echo Indications:    bacteremia  History:        Patient has no prior history of Echocardiogram examinations.                 Sepsis; Risk Factors:IV drug use.  Sonographer:    Delcie Roch Referring Phys: [id number] TRUNG T VU IMPRESSIONS  1. Left ventricular ejection fraction, by estimation, is 60 to 65%. The left ventricle has normal function. The left  ventricle has no regional wall motion abnormalities. Left ventricular diastolic parameters were normal.  2. Right ventricular systolic function is normal. The right ventricular size is normal.  3. Left atrial size was mildly dilated.  4. There is mild thickening of the mitral valve leaflet(s). There is mild calcification of the mitral valve leaflet(s). Mild mitral valve regurgitation.  5. The aortic valve is tricuspid. There is mild calcification of the aortic valve. Aortic valve regurgitation is trivial. Mild aortic valve sclerosis is present, with no evidence of aortic valve stenosis.  6. The inferior vena cava is normal in size with greater than 50% respiratory variability, suggesting right atrial pressure of 3 mmHg. Conclusion(s)/Recommendation(s): No evidence of valvular vegetations on this transthoracic echocardiogram. Would recommend a transesophageal echocardiogram to exclude infective endocarditis if clinically indicated. FINDINGS  Left Ventricle: Left ventricular ejection fraction, by estimation, is 60 to 65%. The left ventricle has normal function. The left ventricle has no regional wall motion abnormalities. The left ventricular internal cavity size was normal in size. There is  no left ventricular hypertrophy. Left ventricular diastolic parameters were normal. Right Ventricle: The right ventricular size is normal. No increase in right ventricular wall thickness. Right ventricular systolic function is normal. Left Atrium: Left atrial size was mildly dilated. Right Atrium: Right atrial size was normal in size. Pericardium: There is no evidence of pericardial effusion. Mitral Valve: The mitral valve is abnormal. There is mild thickening of the mitral valve leaflet(s). There is mild calcification of the mitral valve leaflet(s). Mild mitral valve regurgitation. Tricuspid Valve: The tricuspid valve is normal in structure. Tricuspid valve regurgitation is trivial. Aortic Valve: The aortic valve is tricuspid.  There is mild calcification of the aortic valve. Aortic valve regurgitation is trivial. Mild aortic valve sclerosis is present, with no evidence of aortic valve stenosis. Pulmonic Valve: The pulmonic valve was normal in structure. Pulmonic valve regurgitation is trivial. Aorta: The aortic root is normal in size and structure. Venous: The inferior vena cava is normal in size with greater than 50% respiratory variability, suggesting right atrial pressure of 3 mmHg. IAS/Shunts: No atrial level shunt detected by color flow Doppler.  LEFT VENTRICLE PLAX 2D LVIDd:         4.90 cm  Diastology LVIDs:         3.30 cm  LV e' medial:    12.50 cm/s LV PW:         0.90 cm  LV E/e' medial:  8.6 LV IVS:        0.80 cm  LV e' lateral:   11.30 cm/s LVOT diam:     2.00 cm  LV E/e' lateral: 9.6 LV SV:         88 LV SV Index:   50 LVOT Area:     3.14 cm  RIGHT VENTRICLE             IVC RV S prime:     15.40 cm/s  IVC diam: 1.40 cm TAPSE (M-mode): 3.2 cm LEFT ATRIUM           Index       RIGHT ATRIUM           Index LA diam:      3.70 cm 2.12 cm/m  RA Area:     11.40 cm LA Vol (A2C): 52.7 ml 30.21 ml/m RA Volume:   23.50 ml  13.47 ml/m  AORTIC VALVE LVOT Vmax:   147.00 cm/s LVOT Vmean:  87.500 cm/s LVOT VTI:    0.279 m  AORTA Ao Root diam: 2.80 cm Ao Asc diam:  2.70 cm MITRAL VALVE MV Area (PHT): 2.20 cm     SHUNTS MV Decel Time: 345 msec     Systemic VTI:  0.28 m MV E velocity: 108.00 cm/s  Systemic Diam: 2.00 cm MV A velocity: 74.90 cm/s MV E/A ratio:  1.44 Laurance Flatten MD Electronically signed by Laurance Flatten MD Signature Date/Time: 12/15/2020/12:51:40 PM    Final    VAS [place] UPPER EXTREMITY VENOUS DUPLEX  Result Date: 12/15/2020 UPPER VENOUS STUDY  Indications: Swelling, Pain, and Erythema Other Indications: Cellulitis and abscess of right arm, status post irrigation and debridement 12/11/20. Limitations: Bandages. Comparison Study: No prior study on file Performing Technologist: Sherren Kerns RVS  Examination  Guidelines: A complete evaluation includes B-mode imaging, spectral Doppler, color Doppler, and power Doppler as needed of all accessible portions of each vessel. Bilateral testing is considered an integral part of a complete examination. Limited examinations for reoccurring indications may be performed as noted.  Right Findings: +----------+------------+---------+-----------+----------+---------------------+ RIGHT     CompressiblePhasicitySpontaneousProperties       Summary        +----------+------------+---------+-----------+----------+---------------------+ IJV           Full       Yes       Yes                                    +----------+------------+---------+-----------+----------+---------------------+ Subclavian               Yes       Yes               patent by Color and                                                             Doppler        +----------+------------+---------+-----------+----------+---------------------+ Axillary                 Yes       Yes               patent by Color and                                                             Doppler        +----------+------------+---------+-----------+----------+---------------------+ Brachial      Full                                     Distal portion                                                              visualized       +----------+------------+---------+-----------+----------+---------------------+ Radial        Full                                                        +----------+------------+---------+-----------+----------+---------------------+  Ulnar                    Yes       Yes               patent by Color and                                                             Doppler        +----------+------------+---------+-----------+----------+---------------------+ Cephalic                 Yes       Yes               patent by Color and                                                              Doppler        +----------+------------+---------+-----------+----------+---------------------+ Basilic       Full                                                        +----------+------------+---------+-----------+----------+---------------------+ Not all portions of the right brachial vein were visualized secondary to bandages  Left Findings: +----------+------------+---------+-----------+----------+-------+ LEFT      CompressiblePhasicitySpontaneousPropertiesSummary +----------+------------+---------+-----------+----------+-------+ Subclavian               Yes       Yes                      +----------+------------+---------+-----------+----------+-------+  Summary:  Right: No evidence of deep vein thrombosis in the upper extremity. No evidence of superficial vein thrombosis in the upper extremity. However, unable to visualize the Not all portions of brachial vein evaluated.  Left: No evidence of thrombosis in the subclavian.  *See table(s) above for measurements and observations.     Preliminary     Microbiology: Recent Results (from the past 240 hour(s))  Aerobic Culture (superficial specimen)     Status: None   Collection Time: 12/11/20  3:06 AM   Specimen: Abscess  Result Value Ref Range Status   Specimen Description ABSCESS  Final   Special Requests NONE  Final   Gram Stain   Final    FEW WBC PRESENT, PREDOMINANTLY PMN FEW GRAM POSITIVE COCCI IN CLUSTERS RARE GRAM NEGATIVE RODS    Culture   Final    FEW MULTIPLE ORGANISMS PRESENT, NONE PREDOMINANT NO GROUP A STREP (S.PYOGENES) ISOLATED NO STAPHYLOCOCCUS AUREUS ISOLATED Performed at [facility], [address], [place], [place] [address]    Report Status 12/13/2020 FINAL  Final  SARS Coronavirus 2 by RT PCR (hospital order, performed in [facility] hospital lab) Nasopharyngeal Nasopharyngeal Swab     Status: None   Collection Time:  12/11/20  4:31 AM   Specimen: Nasopharyngeal Swab  Result Value Ref Range Status   SARS Coronavirus 2 NEGATIVE NEGATIVE Final  Comment: (NOTE) SARS-CoV-2 target nucleic acids are NOT DETECTED.  The SARS-CoV-2 RNA is generally detectable in upper and lower respiratory specimens during the acute phase of infection. The lowest concentration of SARS-CoV-2 viral copies this assay can detect is 250 copies / mL. A negative result does not preclude SARS-CoV-2 infection and should not be used as the sole basis for treatment or other patient management decisions.  A negative result may occur with improper specimen collection / handling, submission of specimen other than nasopharyngeal swab, presence of viral mutation(s) within the areas targeted by this assay, and inadequate number of viral copies (<250 copies / mL). A negative result must be combined with clinical observations, patient history, and epidemiological information.  Fact Sheet for Patients:   BoilerBrush.com.cy  Fact Sheet for Healthcare Providers: https://pope.com/  This test is not yet approved or  cleared by the [place] FDA and has been authorized for detection and/or diagnosis of SARS-CoV-2 by FDA under an Emergency Use Authorization (EUA).  This EUA will remain in effect (meaning this test can be used) for the duration of the COVID-19 declaration under Section 564(b)(1) of the Act, 21 U.S.C. section 360bbb-3(b)(1), unless the authorization is terminated or revoked sooner.  Performed at [facility], [address], [place], [place] [address]   Blood culture (routine x 2)     Status: None (Preliminary result)   Collection Time: 12/11/20  4:50 AM   Specimen: BLOOD  Result Value Ref Range Status   Specimen Description BLOOD LEFT UPPER ARM  Final   Special Requests   Final    BOTTLES DRAWN AEROBIC AND ANAEROBIC Blood Culture adequate volume   Culture   Final     NO GROWTH 3 DAYS Performed at [facility], [address], [place], [place] [address]    Report Status PENDING  Incomplete  Surgical pcr screen     Status: None   Collection Time: 12/11/20 11:09 AM   Specimen: Nasal Mucosa; Nasal Swab  Result Value Ref Range Status   MRSA, PCR NEGATIVE NEGATIVE Final   Staphylococcus aureus NEGATIVE NEGATIVE Final    Comment: (NOTE) The Xpert SA Assay (FDA approved for NASAL specimens in patients 75 years of age and older), is one component of a comprehensive surveillance program. It is not intended to diagnose infection nor to guide or monitor treatment. Performed at [facility], [address] [place], [place] [address]   Aerobic/Anaerobic Culture (surgical/deep wound)     Status: None (Preliminary result)   Collection Time: 12/11/20 11:51 AM   Specimen: PATH Other; Body Fluid  Result Value Ref Range Status   Specimen Description ABSCESS  Final   Special Requests RIGHT UPPER ARM SPEC A  Final   Gram Stain   Final    MODERATE WBC PRESENT,BOTH PMN AND MONONUCLEAR NO ORGANISMS SEEN    Culture   Final    RARE STREPTOCOCCUS INTERMEDIUS NO ANAEROBES ISOLATED; CULTURE IN PROGRESS FOR 5 DAYS    Report Status PENDING  Incomplete   Organism ID, Bacteria STREPTOCOCCUS INTERMEDIUS  Final      Susceptibility   Streptococcus intermedius - MIC*    PENICILLIN <=0.06 SENSITIVE Sensitive     CEFTRIAXONE 0.25 SENSITIVE Sensitive     ERYTHROMYCIN INTERMEDIATE Intermediate     LEVOFLOXACIN <=0.25 SENSITIVE Sensitive     VANCOMYCIN Value in next row Sensitive      0.5 SENSITIVEPerformed at [facility], [address], [place], [place] [address]    *  RARE STREPTOCOCCUS INTERMEDIUS  Culture, blood (Routine X 2) w Reflex to ID Panel     Status: None (Preliminary result)   Collection Time: 12/11/20 11:07 PM   Specimen: BLOOD  Result Value Ref Range Status   Specimen Description BLOOD RIGHT FOOT  Final   Special Requests   Final     AEROBIC BOTTLE ONLY Blood Culture results may not be optimal due to an inadequate volume of blood received in culture bottles   Culture   Final    NO GROWTH 2 DAYS Performed at [facility], [address], [place], [place] [address]    Report Status PENDING  Incomplete     Labs: CBC: Recent Labs  Lab 12/10/20 1340 12/11/20 0524 12/12/20 0427 12/13/20 0940 12/14/20 0723  WBC 11.3* 7.8 8.9 6.1 5.8  NEUTROABS 8.6*  --   --   --   --   HGB 11.5* 10.3* 9.0* 8.5* 8.6*  HCT 34.0* 30.8* 28.2* 24.8* 25.5*  MCV 94.2 93.6 95.3 91.2 91.7  PLT 192 192 205 185 171   Basic Metabolic Panel: Recent Labs  Lab 12/10/20 1340 12/11/20 0524 12/13/20 0940 12/14/20 0723  NA 138 135 140 139  K 4.6 3.5 3.5 4.3  CL 100 100 104 104  CO2 GLUCOSE 110* 92 107* 92  BUN 10 7 <5* 5*  CREATININE 0.69 0.60 0.66 0.69  CALCIUM 8.9 8.4* 8.5* 8.5*  MG  --   --  1.8 1.8   Liver Function Tests: Recent Labs  Lab 12/10/20 1340  AST 50*  ALT 24  ALKPHOS 102  BILITOT 0.6  PROT 7.4  ALBUMIN 2.9*   CBG: No results for input(s): GLUCAP in the last 168 hours.  Time spent: 35 minutes  Signed:  Lynden Oxford  [facility]  12/15/2020 6:02 PM

## 2020-12-15 NOTE — Progress Notes (Signed)
  Echocardiogram 2D Echocardiogram has been performed.  Wendy Douglas 12/15/2020, 8:48 AM

## 2020-12-15 NOTE — Progress Notes (Signed)
VASCULAR LAB    Right upper extremity venous duplex has been performed.  See CV proc for preliminary results.   Itzelle Gains, RVT 12/15/2020, 8:59 AM

## 2020-12-15 NOTE — Discharge Instructions (Signed)
How to Change Your Wound Dressing A dressing is a material that is placed in and over a wound. A dressing helps your wound heal by protecting it from:  Germs (bacteria).  Another injury.  Getting too dry or too wet. There are several types of wound dressings. Some examples are:  Bandages.  Gauze pads.  Foam pads.  Antimicrobial dressings. These prevent or treat infection and may contain something that kills germs (an antiseptic), such as silver or iodine.  Calcium alginate. These are general guidelines. Follow your doctor's instructions about how to care for your wound. What are the risks? It is usually safe to change your dressing. The sticky (adhesive) tape that is used with a dressing may make your skin sore or irritated, or it may cause a rash. These are the most common problems. However, more serious problems can happen, such as:  Bleeding.  Infection. Supplies needed: Set up a clean area for wound care. You will need:  A trash bag that you can throw away. Have it open and ready to use.  Hand sanitizer.  Cleaning solution as told by your doctor. This may include: ? Germ-free (sterile) water. ? Germ-free salt water (saline). ? Wound cleanser. ? New wound dressing material. Make sure to open the dressing package so the dressing stays on the inside of the package. You may also need the following in your clean area:  A box of vinyl gloves.  Tape.  Adhesive remover.  Skin protectant. This may be a wipe, film, or spray.  Clean or germ-free scissors.  A cotton-tipped applicator. How to change your dressing How often you change your dressing will depend on your wound. Change the dressing as often as told by your doctor. Your doctor may change your dressing, or a family member, friend, or caregiver may be shown how to do it. It is important to:  Wash your hands before and after each dressing change. If you cannot use soap and water, use hand sanitizer.  Wear gloves  and protective clothing while changing a dressing. This may include eye protection.  Never let anyone change your dressing if he or she has an infection, a skin problem, or a skin wound or cut of any size. Preparing to change your dressing  Take a shower before you do the first dressing change of the day. Before you shower, ask your doctor if you should put plastic leak-proof sealing wrap over your dressing to protect it.  If needed, take pain medicine 30 minutes before you change your dressing as told by your doctor. Taking off your old dressing  Wash your hands with soap and water. Dry your hands with a clean towel. If you cannot use soap and water, use hand sanitizer.  Go to the clean area that you have set up with all the supplies you will need.  If you are using gloves, put the gloves on before you take off the dressing.  Gently take off any adhesive or tape by pulling it off in the direction of your hair growth. Only touch the outside edges of the dressing. ? If you are told to use an adhesive remover to loosen the edges of the dressing, make sure to avoid the wound area.  Take off the dressing. If the dressing sticks to your skin, wet the dressing with the cleaner that your doctor says to use. This helps it come off more easily.  Take off any gauze or packing in your wound.  Throw the old   dressing supplies into the trash bag.  Take off your gloves. To take off each glove, grab the cuff with your other hand and turn the glove inside out. Put the gloves in the trash right away.  Wash your hands with soap and water. Dry your hands with a clean towel. If you cannot use soap and water, use hand sanitizer.   Cleaning your wound  Follow instructions from your doctor about how to clean your wound. This may include using the cleaner that your doctor recommends. ? You may need to use germ-free water to clean your wound if you are putting on a dressing that has silver in it.  Do not use  over-the-counter medicated or antiseptic creams, sprays, liquids, or dressings unless your doctor tells you to do that.  Use a clean gauze pad to clean the area fully with the cleaner that your doctor recommends.  Throw the gauze pad into the trash bag.  Wash your hands with soap and water. Dry your hands with a clean towel. If you cannot use soap and water, use hand sanitizer. Putting on the dressing  If your doctor recommended a skin protectant, put it on the skin around the wound.  Gently pack the wound if told by your doctor.  Cover the wound with the recommended dressing. Make sure to touch only the outside edges of the dressing. Do not touch the inside of the dressing.  Attach the dressing so all sides stay in place. You may do this with medical adhesive, roll gauze, or tape. If you use tape, do not wrap the tape all the way around your arm or leg.  Take off your gloves. Put them in the trash bag with the old dressing. Tie the bag shut and throw it away.  Wash your hands with soap and water. Dry your hands with a clean towel. If you cannot use soap and water, use hand sanitizer. Follow these instructions at home: Wound care  Check your wound every day for signs of infection, or as often as told by your doctor. Check for: ? More redness, swelling, or pain. ? More fluid or blood. ? Warmth. ? Pus or a bad smell.   General instructions  Take over-the-counter and prescription medicines only as told by your doctor.  Ask your doctor if the medicine prescribed to you: ? Requires you to avoid driving or using heavy machinery. ? Can cause trouble pooping (constipation). You may need to take steps to prevent or treat trouble pooping:  Drink enough fluid to keep your pee (urine) pale yellow.  Take over-the-counter or prescription medicines.  Eat foods that are high in fiber. These include beans, whole grains, and fresh fruits and vegetables.  Limit foods that are high in fat and  sugar. These include fried or sweet foods.  Keep all follow-up visits as told by your doctor. This is important. Contact a doctor if:  You have new pain.  You have irritation, a rash, or itching around the wound or dressing.  It hurts to change your dressing.  Changing your dressing causes a lot of bleeding. Get help right away if:  You have very bad pain.  You have signs of infection, such as: ? More redness, swelling, or pain. ? More fluid or blood. ? Warmth. ? Pus or a bad smell. ? Red streaks leading from the wound. ? A fever. Summary  A dressing is a material that is placed in and over a wound.  A dressing helps   your wound heal.  Wash your hands before and after each dressing change.  Follow your doctor's instructions about how to care for your wound.  Check your wound for signs of infection. This information is not intended to replace advice given to you by your health care provider. Make sure you discuss any questions you have with your health care provider. Document Revised: 06/17/2018 Document Reviewed: 06/17/2018 Elsevier Patient Education  2021 [employer]  

## 2020-12-16 LAB — AEROBIC/ANAEROBIC CULTURE W GRAM STAIN (SURGICAL/DEEP WOUND)

## 2020-12-16 LAB — CULTURE, BLOOD (ROUTINE X 2)
Culture: NO GROWTH
Special Requests: ADEQUATE

## 2020-12-17 LAB — CULTURE, BLOOD (ROUTINE X 2): Culture: NO GROWTH

## 2020-12-17 NOTE — TOC Progression Note (Signed)
Transition of Care [facility]) - Progression Note    Patient Details  Name: Wendy Douglas MRN: [id number] Date of Birth: 04-04-1976  [facility] CM/SW Contact   Cellar, RN Phone Number: 12/17/2020, 11:40 AM  Clinical Narrative:     Spoke to patient as follow up call confirming appointment with [facility] and concerns regarding dressing supplies. Confirmed with patient significant other had been completing dressings without any problem and they would be getting dressing supplies from [employer] until appointment with clinic. Patient states she is planning to ask for some supplies at clinic as well. Patient states she is doing well and has no needs or concerns at this time.    Expected Discharge Plan: Home w Home Health Services Barriers to Discharge: Continued Medical Work up  Expected Discharge Plan and Services Expected Discharge Plan: Home w Home Health Services   Discharge Planning Services: CM Consult,Follow-up appt scheduled   Living arrangements for the past 2 months: Apartment Expected Discharge Date: 12/15/20                         HH Arranged: RN [facility] care) [facility] Agency: [facility] Date HH Agency Contacted: 12/14/20 Time HH Agency Contacted: [phone] Representative spoke with at [facility] Agency: [place]   Social Determinants of Health (SDOH) Interventions    Readmission Risk Interventions No flowsheet data found.

## 2020-12-26 ENCOUNTER — Ambulatory Visit (INDEPENDENT_AMBULATORY_CARE_PROVIDER_SITE_OTHER): Payer: Self-pay | Admitting: Orthopaedic Surgery

## 2020-12-26 ENCOUNTER — Other Ambulatory Visit: Payer: Self-pay | Admitting: Physician Assistant

## 2020-12-26 DIAGNOSIS — L02419 Cutaneous abscess of limb, unspecified: Secondary | ICD-10-CM

## 2020-12-26 MED ORDER — AMOXICILLIN-POT CLAVULANATE 875-125 MG PO TABS: 1.0000 | ORAL_TABLET | Freq: Two times a day (BID) | ORAL | 0 refills | Status: AC
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
## 2021-01-02 ENCOUNTER — Encounter: Payer: Self-pay | Admitting: Orthopaedic Surgery

## 2021-01-02 ENCOUNTER — Ambulatory Visit (INDEPENDENT_AMBULATORY_CARE_PROVIDER_SITE_OTHER): Payer: Self-pay | Admitting: Orthopaedic Surgery

## 2021-01-02 DIAGNOSIS — L02419 Cutaneous abscess of limb, unspecified: Secondary | ICD-10-CM
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
## 2021-01-10 ENCOUNTER — Inpatient Hospital Stay: Payer: Self-pay | Admitting: Internal Medicine

## 2023-01-16 ENCOUNTER — Ambulatory Visit: Payer: Self-pay

## 2023-04-11 DIAGNOSIS — K5900 Constipation, unspecified: Secondary | ICD-10-CM | POA: Diagnosis not present

## 2023-04-11 DIAGNOSIS — J45909 Unspecified asthma, uncomplicated: Secondary | ICD-10-CM | POA: Diagnosis not present

## 2023-04-11 DIAGNOSIS — G629 Polyneuropathy, unspecified: Secondary | ICD-10-CM | POA: Diagnosis not present

## 2023-04-11 DIAGNOSIS — F320 Major depressive disorder, single episode, mild: Secondary | ICD-10-CM | POA: Diagnosis not present

## 2023-04-11 DIAGNOSIS — I10 Essential (primary) hypertension: Secondary | ICD-10-CM | POA: Diagnosis not present

## 2023-04-11 DIAGNOSIS — Z882 Allergy status to sulfonamides status: Secondary | ICD-10-CM | POA: Diagnosis not present

## 2023-04-11 DIAGNOSIS — Z720 Tobacco use: Secondary | ICD-10-CM | POA: Diagnosis not present
